# Patient Record
Sex: Female | Born: 1960 | Race: Black or African American | Hispanic: No | Marital: Single | Smoking: Former smoker
Health system: Southern US, Community
[De-identification: ages and names within clinical notes are randomized; demographics above are authoritative.]

## PROBLEM LIST (undated history)

## (undated) DIAGNOSIS — I10 Essential (primary) hypertension: Secondary | ICD-10-CM

## (undated) DIAGNOSIS — K8010 Calculus of gallbladder with chronic cholecystitis without obstruction: Secondary | ICD-10-CM

## (undated) DIAGNOSIS — R569 Unspecified convulsions: Secondary | ICD-10-CM

## (undated) DIAGNOSIS — E079 Disorder of thyroid, unspecified: Secondary | ICD-10-CM

## (undated) DIAGNOSIS — M1990 Unspecified osteoarthritis, unspecified site: Secondary | ICD-10-CM

## (undated) HISTORY — DX: Calculus of gallbladder with chronic cholecystitis without obstruction: K80.10

## (undated) HISTORY — PX: THYROID SURGERY: SHX805

## (undated) HISTORY — DX: Essential (primary) hypertension: I10

---

## 2015-10-13 ENCOUNTER — Encounter (HOSPITAL_COMMUNITY): Payer: Self-pay | Admitting: Emergency Medicine

## 2015-10-13 ENCOUNTER — Emergency Department (HOSPITAL_COMMUNITY)
Admission: EM | Admit: 2015-10-13 | Discharge: 2015-10-13 | Disposition: A | Discharge status: Home / Self Care | Payer: Medicaid Other | Attending: Emergency Medicine | Admitting: Emergency Medicine

## 2015-10-13 DIAGNOSIS — Z79899 Other long term (current) drug therapy: Secondary | ICD-10-CM | POA: Diagnosis not present

## 2015-10-13 DIAGNOSIS — F17210 Nicotine dependence, cigarettes, uncomplicated: Secondary | ICD-10-CM | POA: Insufficient documentation

## 2015-10-13 DIAGNOSIS — R42 Dizziness and giddiness: Secondary | ICD-10-CM | POA: Insufficient documentation

## 2015-10-13 DIAGNOSIS — M1990 Unspecified osteoarthritis, unspecified site: Secondary | ICD-10-CM | POA: Insufficient documentation

## 2015-10-13 HISTORY — DX: Disorder of thyroid, unspecified: E07.9

## 2015-10-13 HISTORY — DX: Unspecified osteoarthritis, unspecified site: M19.90

## 2015-10-13 MED ORDER — PROMETHAZINE HCL 12.5 MG PO TABS
25.0000 mg | ORAL_TABLET | Freq: Once | ORAL | Status: AC
  Administered 2015-10-13: 25 mg via ORAL
  Filled 2015-10-13: qty 2

## 2015-10-13 MED ORDER — MECLIZINE HCL 25 MG PO TABS: 25.0000 mg | ORAL_TABLET | Freq: Three times a day (TID) | ORAL | Status: DC | PRN

## 2015-10-13 MED ORDER — MECLIZINE HCL 12.5 MG PO TABS
25.0000 mg | ORAL_TABLET | Freq: Once | ORAL | Status: AC
  Administered 2015-10-13: 25 mg via ORAL
  Filled 2015-10-13: qty 2

## 2015-10-13 NOTE — ED Notes (Signed)
Pt states her dizziness was better until she ambulated to the bathroom.  MD aware and new orders received.

## 2015-10-13 NOTE — Discharge Instructions (Signed)
Return for worsening symptoms, including confusion, passing out, vision or speech changes, difficulty swallowing, numbness/weakness, inability to walk, or any other symptoms concerning to you.  Vertigo Vertigo means you feel like you or your surroundings are moving when they are not. Vertigo can be dangerous if it occurs when you are at work, driving, or performing difficult activities.  CAUSES  Vertigo occurs when there is a conflict of signals sent to your brain from the visual and sensory systems in your body. There are many different causes of vertigo, including:  Infections, especially in the inner ear.  A bad reaction to a drug or misuse of alcohol and medicines.  Withdrawal from drugs or alcohol.  Rapidly changing positions, such as lying down or rolling over in bed.  A migraine headache.  Decreased blood flow to the brain.  Increased pressure in the brain from a head injury, infection, tumor, or bleeding. SYMPTOMS  You may feel as though the world is spinning around or you are falling to the ground. Because your balance is upset, vertigo can cause nausea and vomiting. You may have involuntary eye movements (nystagmus). DIAGNOSIS  Vertigo is usually diagnosed by physical exam. If the cause of your vertigo is unknown, your caregiver may perform imaging tests, such as an MRI scan (magnetic resonance imaging). TREATMENT  Most cases of vertigo resolve on their own, without treatment. Depending on the cause, your caregiver may prescribe certain medicines. If your vertigo is related to body position issues, your caregiver may recommend movements or procedures to correct the problem. In rare cases, if your vertigo is caused by certain inner ear problems, you may need surgery. HOME CARE INSTRUCTIONS   Follow your caregiver's instructions.  Avoid driving.  Avoid operating heavy machinery.  Avoid performing any tasks that would be dangerous to you or others during a vertigo  episode.  Tell your caregiver if you notice that certain medicines seem to be causing your vertigo. Some of the medicines used to treat vertigo episodes can actually make them worse in some people. SEEK IMMEDIATE MEDICAL CARE IF:   Your medicines do not relieve your vertigo or are making it worse.  You develop problems with talking, walking, weakness, or using your arms, hands, or legs.  You develop severe headaches.  Your nausea or vomiting continues or gets worse.  You develop visual changes.  A family member notices behavioral changes.  Your condition gets worse. MAKE SURE YOU:  Understand these instructions.  Will watch your condition.  Will get help right away if you are not doing well or get worse.   This information is not intended to replace advice given to you by your health care provider. Make sure you discuss any questions you have with your health care provider.   Document Released: 12/29/2004 Document Revised: 06/13/2011 Document Reviewed: 07/14/2014 Elsevier Interactive Patient Education 2016 [employer]  Benign Positional Vertigo Vertigo is the feeling that you or your surroundings are moving when they are not. Benign positional vertigo is the most common form of vertigo. The cause of this condition is not serious (is benign). This condition is triggered by certain movements and positions (is positional). This condition can be dangerous if it occurs while you are doing something that could endanger you or others, such as driving.  CAUSES In many cases, the cause of this condition is not known. It may be caused by a disturbance in an area of the inner ear that helps your brain to sense movement and balance.  This disturbance can be caused by a viral infection (labyrinthitis), head injury, or repetitive motion. RISK FACTORS This condition is more likely to develop in:  Women.  People who are 76 years of age or older. SYMPTOMS Symptoms of this condition usually  happen when you move your head or your eyes in different directions. Symptoms may start suddenly, and they usually last for less than a minute. Symptoms may include:  Loss of balance and falling.  Feeling like you are spinning or moving.  Feeling like your surroundings are spinning or moving.  Nausea and vomiting.  Blurred vision.  Dizziness.  Involuntary eye movement (nystagmus). Symptoms can be mild and cause only slight annoyance, or they can be severe and interfere with daily life. Episodes of benign positional vertigo may return (recur) over time, and they may be triggered by certain movements. Symptoms may improve over time. DIAGNOSIS This condition is usually diagnosed by medical history and a physical exam of the head, neck, and ears. You may be referred to a health care provider who specializes in ear, nose, and throat (ENT) problems (otolaryngologist) or a provider who specializes in disorders of the nervous system (neurologist). You may have additional testing, including:  MRI.  A CT scan.  Eye movement tests. Your health care provider may ask you to change positions quickly while he or she watches you for symptoms of benign positional vertigo, such as nystagmus. Eye movement may be tested with an electronystagmogram (ENG), caloric stimulation, the Dix-Hallpike test, or the roll test.  An electroencephalogram (EEG). This records electrical activity in your brain.  Hearing tests. TREATMENT Usually, your health care provider will treat this by moving your head in specific positions to adjust your inner ear back to normal. Surgery may be needed in severe cases, but this is rare. In some cases, benign positional vertigo may resolve on its own in 2-4 weeks. HOME CARE INSTRUCTIONS Safety  Move slowly.Avoid sudden body or head movements.  Avoid driving.  Avoid operating heavy machinery.  Avoid doing any tasks that would be dangerous to you or others if a vertigo episode  would occur.  If you have trouble walking or keeping your balance, try using a cane for stability. If you feel dizzy or unstable, sit down right away.  Return to your normal activities as told by your health care provider. Ask your health care provider what activities are safe for you. General Instructions  Take over-the-counter and prescription medicines only as told by your health care provider.  Avoid certain positions or movements as told by your health care provider.  Drink enough fluid to keep your urine clear or pale yellow.  Keep all follow-up visits as told by your health care provider. This is important. SEEK MEDICAL CARE IF:  You have a fever.  Your condition gets worse or you develop new symptoms.  Your family or friends notice any behavioral changes.  Your nausea or vomiting gets worse.  You have numbness or a "pins and needles" sensation. SEEK IMMEDIATE MEDICAL CARE IF:  You have difficulty speaking or moving.  You are always dizzy.  You faint.  You develop severe headaches.  You have weakness in your legs or arms.  You have changes in your hearing or vision.  You develop a stiff neck.  You develop sensitivity to light.   This information is not intended to replace advice given to you by your health care provider. Make sure you discuss any questions you have with your health care  provider.   Document Released: 12/27/2005 Document Revised: 12/10/2014 Document Reviewed: 07/14/2014 Elsevier Interactive Patient Education [employer]

## 2015-10-13 NOTE — ED Provider Notes (Signed)
CSN: [id number]     Arrival date & time 10/13/15  1109 History  By signing my name below, I, [facility], attest that this documentation has been prepared under the direction and in the presence of Lavera Guise, MD. Electronically Signed: Randell Patient, ED Scribe. 10/13/2015. 11:48 AM.   Chief Complaint  Patient presents with  . Dizziness    The history is provided by the patient. No language interpreter was used.   HPI Comments: Alejandra Sanders is a 55 y.o. female with a hx of arthritis and thyroid disease who presents to the Emergency Department complaining of constant, gradually improving, moderate dizziness that she describes as room-spinning onset this morning upon waking. She reports associated imbalance when ambulating. Dizziness worse with movement of the head sporadically. She notes similar symptoms 2 days ago that resolved without treatment. NKDA. Denies LOC, slurred speech, difficulty swallowing, double vision, cough, fevers, or any other symptoms currently.  Past Medical History  Diagnosis Date  . Arthritis   . Thyroid disease    Past Surgical History  Procedure Laterality Date  . Thyroid surgery     History reviewed. No pertinent family history. Social History  Substance Use Topics  . Smoking status: Current Every Day Smoker -- 0.50 packs/day    Types: Cigarettes  . Smokeless tobacco: None  . Alcohol Use: No   OB History    No data available     Review of Systems  Constitutional: Negative for fever.  HENT: Negative for trouble swallowing.   Eyes: Negative for visual disturbance.  Respiratory: Negative for cough.   Neurological: Positive for dizziness. Negative for syncope and speech difficulty.  All other systems reviewed and are negative.     Allergies  Review of patient's allergies indicates no known allergies.  Home Medications   Prior to Admission medications   Medication Sig Start Date End Date Taking? Authorizing Provider  meclizine  (ANTIVERT) 25 MG tablet Take 1 tablet (25 mg total) by mouth 3 (three) times daily as needed for dizziness. 10/13/15   Lavera Guise, MD   BP 149/101 mmHg  Pulse 81  Temp(Src) 97.1 F (36.2 C) (Temporal)  Resp 14  Ht 5\' 5"  (1.651 m)  Wt 185 lb (83.915 kg)  BMI 30.79 kg/m2  SpO2 98% Physical Exam Physical Exam  Nursing note and vitals reviewed. Constitutional: Well developed, well nourished, non-toxic, and in no acute distress Head: Normocephalic and atraumatic.  Mouth/Throat: Oropharynx is clear and moist.  Neck: Normal range of motion. Neck supple.  Cardiovascular: Normal rate and regular rhythm.   Pulmonary/Chest: Effort normal and breath sounds normal.  Abdominal: Soft. There is no tenderness. There is no rebound and no guarding.  Musculoskeletal: Normal range of motion.  Neurological: Alert, oriented to person, place, time, and situation. Memory grossly in tact. Fluent speech. No dysarthria or aphasia.  Cranial nerves: VF are full. Pupils are symmetric, and reactive to light. EOMI without nystagmus. No gaze deviation. Facial muscles symmetric with activation. Sensation to light touch over face in tact bilaterally. Hearing grossly in tact. Palate elevates symmetrically. Head turn and shoulder shrug are intact. Tongue midline.  Reflexes defered.  Muscle bulk and tone normal. No pronator drift. Moves all extremities symmetrically. Sensation to light touch is in tact throughout in bilateral upper and lower extremities. Coordination reveals no dysmetria with finger to nose. Gait is narrow-based and steady. Non-ataxic. Skin: Skin is warm and dry.  Psychiatric: Cooperative  ED Course  Procedures   DIAGNOSTIC STUDIES: Oxygen  Saturation is 99% on RA, normal by my interpretation.    COORDINATION OF CARE: 11:43 AM Will provide pt with information about positions to alleviate vertigo. Will order Phenergan. Discussed treatment plan with pt at bedside and pt agreed to plan.   Labs  Review Labs Reviewed - No data to display  Imaging Review No results found. I have personally reviewed and evaluated these images and lab results as part of my medical decision-making.   EKG Interpretation None      MDM   Final diagnoses:  Vertigo    55 year old female with history of hypothyroidism who presents with a sensation of vertigo. She is well-appearing and in no acute distress. She has no concerning features on her neurological exam that would be suggestive of central etiology. She is able to ambulate steadily. Seems consistent with peripheral vertigo and is given Phenergan and meclizine with improvement in her symptoms. Neurological exam remains intact. Given meclizine for home. Strict return and follow-up instructions reviewed. She expressed understanding of all discharge instructions and felt comfortable with the plan of care.   I personally performed the services described in this documentation, which was scribed in my presence. The recorded information has been reviewed and is accurate.   Lavera Guise, MD 10/13/15 [phone]

## 2015-10-13 NOTE — ED Notes (Addendum)
Pt reports dizziness when she woke up this morning. States she woke up at 0700 this morning and did not go to bed dizzy. States similar episode happened on Sunday, but lasted only 5 min. Pt reports increase in dizziness with positional changes. Denies any neurological deficits.

## 2017-06-14 ENCOUNTER — Encounter (HOSPITAL_COMMUNITY): Payer: Self-pay | Admitting: *Deleted

## 2017-06-14 ENCOUNTER — Emergency Department (HOSPITAL_COMMUNITY): Payer: Medicaid Other

## 2017-06-14 ENCOUNTER — Other Ambulatory Visit: Payer: Self-pay

## 2017-06-14 DIAGNOSIS — M79674 Pain in right toe(s): Secondary | ICD-10-CM | POA: Diagnosis present

## 2017-06-14 DIAGNOSIS — B351 Tinea unguium: Secondary | ICD-10-CM | POA: Insufficient documentation

## 2017-06-14 DIAGNOSIS — F17210 Nicotine dependence, cigarettes, uncomplicated: Secondary | ICD-10-CM | POA: Diagnosis not present

## 2017-06-14 DIAGNOSIS — L600 Ingrowing nail: Secondary | ICD-10-CM | POA: Insufficient documentation

## 2017-06-14 NOTE — ED Triage Notes (Signed)
Pt c/o right foot pain that starts at her big toe and radiates to the side of her foot

## 2017-06-15 ENCOUNTER — Emergency Department (HOSPITAL_COMMUNITY)
Admission: EM | Admit: 2017-06-15 | Discharge: 2017-06-15 | Disposition: A | Discharge status: Home / Self Care | Payer: Medicaid Other | Attending: Emergency Medicine | Admitting: Emergency Medicine

## 2017-06-15 DIAGNOSIS — M79674 Pain in right toe(s): Secondary | ICD-10-CM

## 2017-06-15 DIAGNOSIS — B351 Tinea unguium: Secondary | ICD-10-CM

## 2017-06-15 DIAGNOSIS — L600 Ingrowing nail: Secondary | ICD-10-CM

## 2017-06-15 MED ORDER — CLOTRIMAZOLE 1 % EX CREA: TOPICAL_CREAM | CUTANEOUS | 0 refills | Status: DC

## 2017-06-15 MED ORDER — DOXYCYCLINE HYCLATE 100 MG PO CAPS: 100.0000 mg | ORAL_CAPSULE | Freq: Two times a day (BID) | ORAL | 0 refills | Status: DC

## 2017-06-15 NOTE — ED Provider Notes (Signed)
[facility] EMERGENCY DEPARTMENT Provider Note   CSN: [id number] Arrival date & time: 06/14/17  2222     History   Chief Complaint Chief Complaint  Patient presents with  . Foot Pain    HPI Alejandra Sanders is a 57 y.o. female.  Patient with 2 days of right great toe pain that radiates throughout her entire foot when she tries to stand.  Denies any falls or injuries.  Denies any fever or vomiting.  History of arthritis in the past.  No history of gout.  No other joint issues.  She has pain with ambulation and pain along her right great toe nailbed.  There is no been no bleeding or drainage.   The history is provided by the patient.  Foot Pain  Pertinent negatives include no abdominal pain, no headaches and no shortness of breath.    Past Medical History:  Diagnosis Date  . Arthritis   . Thyroid disease     There are no active problems to display for this patient.   Past Surgical History:  Procedure Laterality Date  . THYROID SURGERY      OB History    No data available       Home Medications    Prior to Admission medications   Medication Sig Start Date End Date Taking? Authorizing Provider  meclizine (ANTIVERT) 25 MG tablet Take 1 tablet (25 mg total) by mouth 3 (three) times daily as needed for dizziness. 10/13/15   Lavera Guise, MD    Family History History reviewed. No pertinent family history.  Social History Social History   Tobacco Use  . Smoking status: Current Every Day Smoker    Packs/day: 0.25    Types: Cigarettes  . Smokeless tobacco: Never Used  Substance Use Topics  . Alcohol use: No  . Drug use: No     Allergies   Patient has no known allergies.   Review of Systems Review of Systems  Constitutional: Negative for activity change, appetite change and fever.  HENT: Negative for congestion and rhinorrhea.   Respiratory: Negative for cough, chest tightness and shortness of breath.   Gastrointestinal: Negative for abdominal pain,  nausea and vomiting.  Genitourinary: Negative for dysuria.  Musculoskeletal: Positive for arthralgias and myalgias.  Skin: Positive for wound.  Neurological: Negative for dizziness, weakness and headaches.   all other systems are negative except as noted in the HPI and PMH.     Physical Exam Updated Vital Signs BP (!) 163/91   Pulse 87   Temp 97.8 F (36.6 C) (Oral)   Resp 16   Ht 5\' 5"  (1.651 m)   Wt 84.4 kg (186 lb)   SpO2 95%   BMI 30.95 kg/m   Physical Exam  Constitutional: She is oriented to person, place, and time. She appears well-developed and well-nourished. No distress.  HENT:  Head: Normocephalic and atraumatic.  Mouth/Throat: Oropharynx is clear and moist. No oropharyngeal exudate.  Eyes: Conjunctivae and EOM are normal. Pupils are equal, round, and reactive to light.  Neck: Normal range of motion. Neck supple.  No meningismus.  Cardiovascular: Normal rate, regular rhythm, normal heart sounds and intact distal pulses.  No murmur heard. Pulmonary/Chest: Effort normal and breath sounds normal. No respiratory distress.  Abdominal: Soft. There is no tenderness. There is no rebound and no guarding.  Musculoskeletal: Normal range of motion. She exhibits tenderness. She exhibits no edema.  Onychomycosis present. Ingrown nail present. There is tenderness to palpation across right great toe  cuticle.  There is no erythema or fluctuance. Intact DP and PT pulses.  Intact Achilles. No paronychia. Ingrown toenail present.  Neurological: She is alert and oriented to person, place, and time. No cranial nerve deficit. She exhibits normal muscle tone. Coordination normal.   5/5 strength throughout. CN 2-12 intact.Equal grip strength.   Skin: Skin is warm.  Psychiatric: She has a normal mood and affect. Her behavior is normal.  Nursing note and vitals reviewed.    ED Treatments / Results  Labs (all labs ordered are listed, but only abnormal results are displayed) Labs  Reviewed - No data to display  EKG  EKG Interpretation None       Radiology Dg Foot Complete Right  Result Date: 06/14/2017 CLINICAL DATA:  57 year old female with pain in the right foot and great toe. EXAM: RIGHT FOOT COMPLETE - 3+ VIEW COMPARISON:  None. FINDINGS: There is no evidence of fracture or dislocation. There is no evidence of arthropathy or other focal bone abnormality. Soft tissues are unremarkable. IMPRESSION: Negative. Electronically Signed   By: Elgie Collard M.D.   On: 06/14/2017 23:27    Procedures Procedures (including critical care time)  Medications Ordered in ED Medications - No data to display   Initial Impression / Assessment and Plan / ED Course  I have reviewed the triage vital signs and the nursing notes.  Pertinent labs & imaging results that were available during my care of the patient were reviewed by me and considered in my medical decision making (see chart for details).    Right great toe pain without trauma.  Neurovascularly intact. No systemic symptoms.   We will treat for onychomycosis and possible early infection.  No evidence of paronychia at this time.  X-rays negative. No MTP tenderness.  Doubt gout.  Patient to follow-up with podiatry.  Will give empiric antibiotics as well as topical antifungals.  Return precautions discussed.  Final Clinical Impressions(s) / ED Diagnoses   Final diagnoses:  Onychomycosis  Pain of right great toe  Ingrown nail of great toe of right foot    ED Discharge Orders    None       Avan Gullett, Jeannett Senior, MD 06/15/17 [phone]

## 2017-06-15 NOTE — Discharge Instructions (Signed)
Follow-up with Dr. Nolen Mu regarding her ingrown toenails.  Take the antibiotic as prescribed.  Return to the ED if you develop new or worsening symptoms.

## 2018-05-21 ENCOUNTER — Other Ambulatory Visit (HOSPITAL_COMMUNITY): Payer: Self-pay | Admitting: Family Medicine

## 2018-05-21 ENCOUNTER — Ambulatory Visit (HOSPITAL_COMMUNITY)
Admission: RE | Admit: 2018-05-21 | Discharge: 2018-05-21 | Disposition: A | Discharge status: Home / Self Care | Payer: Medicaid Other | Source: Ambulatory Visit | Attending: Family Medicine | Admitting: Family Medicine

## 2018-05-21 DIAGNOSIS — M069 Rheumatoid arthritis, unspecified: Secondary | ICD-10-CM

## 2018-05-21 DIAGNOSIS — M25532 Pain in left wrist: Secondary | ICD-10-CM | POA: Diagnosis not present

## 2018-05-27 ENCOUNTER — Emergency Department (HOSPITAL_COMMUNITY): Payer: Medicaid Other

## 2018-05-27 ENCOUNTER — Emergency Department (HOSPITAL_COMMUNITY)
Admission: EM | Admit: 2018-05-27 | Discharge: 2018-05-27 | Disposition: A | Discharge status: Home / Self Care | Payer: Medicaid Other | Attending: Emergency Medicine | Admitting: Emergency Medicine

## 2018-05-27 ENCOUNTER — Encounter (HOSPITAL_COMMUNITY): Payer: Self-pay | Admitting: Emergency Medicine

## 2018-05-27 DIAGNOSIS — F17210 Nicotine dependence, cigarettes, uncomplicated: Secondary | ICD-10-CM | POA: Insufficient documentation

## 2018-05-27 DIAGNOSIS — Z79899 Other long term (current) drug therapy: Secondary | ICD-10-CM | POA: Diagnosis not present

## 2018-05-27 DIAGNOSIS — K8000 Calculus of gallbladder with acute cholecystitis without obstruction: Secondary | ICD-10-CM | POA: Diagnosis not present

## 2018-05-27 DIAGNOSIS — R1013 Epigastric pain: Secondary | ICD-10-CM | POA: Diagnosis present

## 2018-05-27 DIAGNOSIS — N2889 Other specified disorders of kidney and ureter: Secondary | ICD-10-CM

## 2018-05-27 LAB — COMPREHENSIVE METABOLIC PANEL
ALBUMIN: 3.9 g/dL (ref 3.5–5.0)
ALT: 19 U/L (ref 0–44)
ANION GAP: 9 (ref 5–15)
AST: 15 U/L (ref 15–41)
Alkaline Phosphatase: 113 U/L (ref 38–126)
BILIRUBIN TOTAL: 0.2 mg/dL — AB (ref 0.3–1.2)
BUN: 11 mg/dL (ref 6–20)
CALCIUM: 9.3 mg/dL (ref 8.9–10.3)
CO2: 27 mmol/L (ref 22–32)
Chloride: 100 mmol/L (ref 98–111)
Creatinine, Ser: 0.61 mg/dL (ref 0.44–1.00)
GFR calc Af Amer: >60 mL/min (ref >60)
GLUCOSE: 105 mg/dL — AB (ref 70–99)
Potassium: 3.5 mmol/L (ref 3.5–5.1)
Sodium: 136 mmol/L (ref 135–145)
TOTAL PROTEIN: 7.9 g/dL (ref 6.5–8.1)

## 2018-05-27 LAB — CBC
HEMATOCRIT: 42.3 % (ref 36.0–46.0)
Hemoglobin: 13 g/dL (ref 12.0–15.0)
MCH: 25 pg — AB (ref 26.0–34.0)
MCHC: 30.7 g/dL (ref 30.0–36.0)
MCV: 81.5 fL (ref 80.0–100.0)
NRBC: 0 % (ref 0.0–0.2)
Platelets: 276 10*3/uL (ref 150–400)
RBC: 5.19 MIL/uL — ABNORMAL HIGH (ref 3.87–5.11)
RDW: 14.6 % (ref 11.5–15.5)
WBC: 7.2 10*3/uL (ref 4.0–10.5)

## 2018-05-27 LAB — TROPONIN I: Troponin I: <0.03 ng/mL (ref <0.03)

## 2018-05-27 LAB — LIPASE, BLOOD: Lipase: 30 U/L (ref 11–51)

## 2018-05-27 MED ORDER — OXYCODONE-ACETAMINOPHEN 5-325 MG PO TABS: 1.0000 | ORAL_TABLET | ORAL | 0 refills | Status: DC | PRN

## 2018-05-27 MED ORDER — ONDANSETRON HCL 4 MG PO TABS: 4.0000 mg | ORAL_TABLET | Freq: Four times a day (QID) | ORAL | 0 refills | Status: DC

## 2018-05-27 MED ORDER — SODIUM CHLORIDE 0.9 % IV BOLUS
500.0000 mL | Freq: Once | INTRAVENOUS | Status: AC
  Administered 2018-05-27: 500 mL via INTRAVENOUS

## 2018-05-27 MED ORDER — SODIUM CHLORIDE 0.9% FLUSH: 3.0000 mL | Freq: Once | INTRAVENOUS | Status: DC

## 2018-05-27 MED ORDER — ONDANSETRON HCL 4 MG/2ML IJ SOLN
4.0000 mg | Freq: Once | INTRAMUSCULAR | Status: AC
  Administered 2018-05-27: 4 mg via INTRAVENOUS
  Filled 2018-05-27: qty 2

## 2018-05-27 MED ORDER — IOHEXOL 300 MG/ML  SOLN
100.0000 mL | Freq: Once | INTRAMUSCULAR | Status: AC | PRN
  Administered 2018-05-27: 100 mL via INTRAVENOUS

## 2018-05-27 NOTE — ED Notes (Signed)
Patient transported to CT 

## 2018-05-27 NOTE — Discharge Instructions (Signed)
You have gallstones that are inflamed.  You will need to see a general surgeon.  Phone number given.  Medication for pain and nausea.  Avoid greasy foods.  Additionally, you have a small mass in your right kidney.  You will need an ultrasound or MRI of this area.  Your primary care doctor can arrange this.

## 2018-05-27 NOTE — ED Triage Notes (Signed)
Patient complains of chest pain that radiates towards middle of back x 3 days. Denies n/v/d.

## 2018-05-27 NOTE — ED Provider Notes (Addendum)
[facility] EMERGENCY DEPARTMENT Provider Note   CSN: [id number] Arrival date & time: 05/27/18  1515    History   Chief Complaint Chief Complaint  Patient presents with  . Chest Pain    HPI Alejandra Sanders is a 58 y.o. female.     Epiastric pain radiating to right upper quadrant and right mid back for several days, worse after eating meals.  No known history of gallbladder disease.  Severity of pain is moderate.  Patient has taken nothing at home.     Past Medical History:  Diagnosis Date  . Arthritis   . Thyroid disease     There are no active problems to display for this patient.   Past Surgical History:  Procedure Laterality Date  . THYROID SURGERY       OB History   No obstetric history on file.      Home Medications    Prior to Admission medications   Medication Sig Start Date End Date Taking? Authorizing Provider  amLODipine (NORVASC) 10 MG tablet Take 10 mg by mouth daily. 05/11/18  Yes [provider]  folic acid (FOLVITE) 1 MG tablet Take 1 mg by mouth daily. 04/17/18  Yes [provider]  hydroxychloroquine (PLAQUENIL) 200 MG tablet Take 2 tablets by mouth daily. 04/17/18  Yes [provider]  lisinopril (PRINIVIL,ZESTRIL) 10 MG tablet Take 10 mg by mouth daily. 03/11/18  Yes [provider]  meclizine (ANTIVERT) 25 MG tablet Take 1 tablet (25 mg total) by mouth 3 (three) times daily as needed for dizziness. 10/13/15  Yes Lavera Guise, MD  meloxicam (MOBIC) 15 MG tablet Take 15 mg by mouth daily.   Yes [provider]  ORENCIA CLICKJECT 125 MG/ML SOAJ Inject 1 pen into the skin once a week. Takes on Tuesdays 02/28/18  Yes [provider]  RASUVO 20 MG/0.4ML SOAJ Inject 1 pen into the skin once a week. Takes on Tuesdays 04/27/18  Yes [provider]  clotrimazole (LOTRIMIN) 1 % cream Apply to affected area 2 times daily 06/15/17   Rancour, Jeannett Senior, MD  doxycycline (VIBRAMYCIN) 100 MG capsule Take  1 capsule (100 mg total) by mouth 2 (two) times daily. 06/15/17   Rancour, Jeannett Senior, MD  ondansetron (ZOFRAN) 4 MG tablet Take 1 tablet (4 mg total) by mouth every 6 (six) hours. 05/27/18   Donnetta Hutching, MD  oxyCODONE-acetaminophen (PERCOCET) 5-325 MG tablet Take 1 tablet by mouth every 4 (four) hours as needed. 05/27/18   Donnetta Hutching, MD  oxyCODONE-acetaminophen (PERCOCET) 5-325 MG tablet Take 1 tablet by mouth every 4 (four) hours as needed. 05/27/18   Donnetta Hutching, MD    Family History No family history on file.  Social History Social History   Tobacco Use  . Smoking status: Current Every Day Smoker    Packs/day: 0.25    Types: Cigarettes  . Smokeless tobacco: Never Used  Substance Use Topics  . Alcohol use: No  . Drug use: No     Allergies   Patient has no known allergies.   Review of Systems Review of Systems  All other systems reviewed and are negative.    Physical Exam Updated Vital Signs BP 102/63   Pulse 88   Resp (!) 23   Ht 5\' 5"  (1.651 m)   Wt 88 kg   SpO2 97%   BMI 32.28 kg/m   Physical Exam Vitals signs and nursing note reviewed.  Constitutional:      Appearance: She is well-developed.  HENT:     Head: Normocephalic and atraumatic.  Eyes:     Conjunctiva/sclera: Conjunctivae normal.  Neck:     Musculoskeletal: Neck supple.  Cardiovascular:     Rate and Rhythm: Normal rate and regular rhythm.  Pulmonary:     Effort: Pulmonary effort is normal.     Breath sounds: Normal breath sounds.  Abdominal:     Comments: Minimal tenderness epigastrium and right upper quadrant  Musculoskeletal: Normal range of motion.  Skin:    General: Skin is warm and dry.  Neurological:     Mental Status: She is alert and oriented to person, place, and time.  Psychiatric:        Behavior: Behavior normal.      ED Treatments / Results  Labs (all labs ordered are listed, but only abnormal results are displayed) Labs Reviewed  CBC - Abnormal; Notable for the  following components:      Result Value   RBC 5.19 (*)    MCH 25.0 (*)    All other components within normal limits  COMPREHENSIVE METABOLIC PANEL - Abnormal; Notable for the following components:   Glucose, Bld 105 (*)    Total Bilirubin 0.2 (*)    All other components within normal limits  TROPONIN I  LIPASE, BLOOD    EKG EKG Interpretation  Date/Time:  Sunday May 27 2018 15:27:07 EST Ventricular Rate:  93 PR Interval:  140 QRS Duration: 86 QT Interval:  374 QTC Calculation: 465 R Axis:   92 Text Interpretation:  Normal sinus rhythm Biatrial enlargement Rightward axis Pulmonary disease pattern Abnormal ECG Confirmed by Donnetta Hutchingook, Richardson Dubree ([id number]) on 05/27/2018 5:37:17 PM   Radiology Dg Chest 2 View  Result Date: 05/27/2018 CLINICAL DATA:  Chest pain EXAM: CHEST - 2 VIEW COMPARISON:  None. FINDINGS: Heart and mediastinal contours are within normal limits. No focal opacities or effusions. No acute bony abnormality. IMPRESSION: No active cardiopulmonary disease. Electronically Signed   By: Charlett NoseKevin  Dover M.D.   On: 05/27/2018 16:18   Ct Abdomen Pelvis W Contrast  Result Date: 05/27/2018 CLINICAL DATA:  Chest pain radiating towards the middle of the back for 3 days. EXAM: CT ABDOMEN AND PELVIS WITH CONTRAST TECHNIQUE: Multidetector CT imaging of the abdomen and pelvis was performed using the standard protocol following bolus administration of intravenous contrast. CONTRAST:  100mL OMNIPAQUE IOHEXOL 300 MG/ML  SOLN COMPARISON:  None. FINDINGS: Lower chest: Lung bases are clear. Hepatobiliary: Small circumscribed cyst in the lateral segment left lobe of liver. No other focal lesions. Mild diffuse fatty infiltration of the liver. The gallbladder is mildly contracted and thick walled with multiple stones including a 1.6 cm stone in the gallbladder neck. There is pericholecystic edema and stranding. Changes are consistent with acute cholecystitis. No bile duct dilatation. Pancreas: Unremarkable.  No pancreatic ductal dilatation or surrounding inflammatory changes. Spleen: Normal in size without focal abnormality. Adrenals/Urinary Tract: No adrenal gland nodules. There is a 1.3 cm diameter lesion in the posterior midpole of the right kidney. The lesion demonstrates increased Hounsfield units on both early and delayed phase images and may represent a solid lesion. Hemorrhagic cyst would be another possibility. Suggest follow-up with either ultrasound to look for cystic nature of the lesion or renal protocol MRI for further characterization. There are multiple tiny low-attenuation lesions in both kidneys probably representing small cysts but too small to characterize. No hydronephrosis or hydroureter. Nephrograms are symmetrical. Bladder wall is not thickened. Stomach/Bowel: Stomach is within normal limits. Appendix appears normal.  No evidence of bowel wall thickening, distention, or inflammatory changes. Vascular/Lymphatic: Aortic atherosclerosis. No enlarged abdominal or pelvic lymph nodes. Reproductive: Uterus and bilateral adnexa are unremarkable. Other: No abdominal wall hernia or abnormality. No abdominopelvic ascites. Musculoskeletal: Mild degenerative changes in the spine. No destructive bone lesions. IMPRESSION: 1. Cholelithiasis with inflammatory changes in the gallbladder consistent with acute cholecystitis. 2. 1.3 cm diameter indeterminate lesion in the right kidney. Suggest follow-up with either ultrasound to assess for cystic nature of the lesion or renal protocol MRI for further characterization. 3. Mild diffuse fatty infiltration of the liver. Aortic Atherosclerosis (ICD10-I70.0). Electronically Signed   By: Burman Nieves M.D.   On: 05/27/2018 19:35    Procedures Procedures (including critical care time)  Medications Ordered in ED Medications  sodium chloride flush (NS) 0.9 % injection 3 mL (has no administration in time range)  ondansetron (ZOFRAN) injection 4 mg (4 mg Intravenous  Given 05/27/18 1803)  sodium chloride 0.9 % bolus 500 mL (500 mLs Intravenous New Bag/Given 05/27/18 1802)  iohexol (OMNIPAQUE) 300 MG/ML solution 100 mL (100 mLs Intravenous Contrast Given 05/27/18 1855)     Initial Impression / Assessment and Plan / ED Course  I have reviewed the triage vital signs and the nursing notes.  Pertinent labs & imaging results that were available during my care of the patient were reviewed by me and considered in my medical decision making (see chart for details).        Epigastric pain with radiation right upper quadrant and right mid back for 3 days.  White count and liver function normal.  CT abdomen pelvis reveals cholelithiasis with possible cholecystitis.  Will discuss with general surgery.  I also discussed the lesion on the right kidney.  She will follow-up with her primary care doctor.  Final Clinical Impressions(s) / ED Diagnoses   Final diagnoses:  Calculus of gallbladder with acute cholecystitis without obstruction  Mass of right kidney    ED Discharge Orders         Ordered    oxyCODONE-acetaminophen (PERCOCET) 5-325 MG tablet  Every 4 hours PRN     05/27/18 2024    ondansetron (ZOFRAN) 4 MG tablet  Every 6 hours     05/27/18 2024    oxyCODONE-acetaminophen (PERCOCET) 5-325 MG tablet  Every 4 hours PRN     05/27/18 2024           Donnetta Hutching, MD 05/27/18 2007    Donnetta Hutching, MD 05/27/18 2026

## 2018-05-28 MED FILL — Oxycodone w/ Acetaminophen Tab 5-325 MG: ORAL | Qty: 6 | Status: AC

## 2018-06-05 ENCOUNTER — Ambulatory Visit (INDEPENDENT_AMBULATORY_CARE_PROVIDER_SITE_OTHER): Payer: Medicaid Other | Admitting: General Surgery

## 2018-06-05 ENCOUNTER — Encounter: Payer: Self-pay | Admitting: General Surgery

## 2018-06-05 VITALS — BP 122/69 | HR 96 | Temp 96.8°F | Resp 18 | Wt 187.2 lb

## 2018-06-05 DIAGNOSIS — K8020 Calculus of gallbladder without cholecystitis without obstruction: Secondary | ICD-10-CM

## 2018-06-05 NOTE — H&P (Signed)
[facility] History and Physical  Reason for Referral:Gallstones  Referring Physician:  Dr. Janna Arch, MD (PCP)      Chief Complaint    Cholelithiasis      Alejandra Sanders is a 58 y.o. female.  HPI: Alejandra Sanders is a very sweet 58 yo who was recently seen in the ED with complaints of epigastric and RUQ pain that started after eating some chicken wings. She says that the pain was at first like heartburn but did not improve with any medicines, and was worse after each meal. She went to the ED as she was afraid the pain was more in her chest, and she was worked up.  She had a normal troponin and EKG, and was sent for a CT that demonstrated stones and possible gallbladder wall thickening.   She said in the ED her pain improved, and the ED discussed the case with Dr. Lovell Sheehan who said for her follow up as an outpatient.  She reported no associated nausea or vomiting or bloating. She has been on a low fat diet and only eating crackers, and does not feel any better. She is here today to discuss gallbladder removal.        Past Medical History:  Diagnosis Date  . Arthritis   . Hypertension   . Thyroid disease          Past Surgical History:  Procedure Laterality Date  . THYROID SURGERY           Family History  Problem Relation Age of Onset  . Hypertension Mother   . Diabetes Mother   . Hypertension Father     Social History        Tobacco Use  . Smoking status: Current Every Day Smoker    Packs/day: 0.25    Types: Cigarettes  . Smokeless tobacco: Never Used  Substance Use Topics  . Alcohol use: No  . Drug use: No    Medications: I have reviewed the patient's current medications. Allergies as of 06/05/2018   No Known Allergies        Medication List       Accurate as of June 05, 2018 10:00 AM. Always use your most recent med list.        amLODipine 10 MG tablet Commonly known as:  NORVASC Take 10 mg by mouth  daily.   folic acid 1 MG tablet Commonly known as:  FOLVITE Take 1 mg by mouth daily.   hydroxychloroquine 200 MG tablet Commonly known as:  PLAQUENIL Take 2 tablets by mouth daily.   lisinopril 10 MG tablet Commonly known as:  PRINIVIL,ZESTRIL Take 10 mg by mouth daily.   meloxicam 15 MG tablet Commonly known as:  MOBIC Take 15 mg by mouth daily.   ORENCIA CLICKJECT 125 MG/ML Soaj Generic drug:  Abatacept Inject 1 pen into the skin once a week. Takes on Tuesdays   oxyCODONE-acetaminophen 5-325 MG tablet Commonly known as:  PERCOCET Take 1 tablet by mouth every 4 (four) hours as needed.   oxyCODONE-acetaminophen 5-325 MG tablet Commonly known as:  PERCOCET Take 1 tablet by mouth every 4 (four) hours as needed.   RASUVO 20 MG/0.4ML Soaj Generic drug:  Methotrexate (PF) Inject 1 pen into the skin once a week. Takes on Tuesdays        ROS:  A comprehensive review of systems was negative except for: Gastrointestinal: positive for abdominal pain Musculoskeletal: positive for joint pain  Blood pressure 122/69, pulse 96, temperature (!)  96.8 F (36 C), temperature source Temporal, resp. rate 18, weight 187 lb 3.2 oz (84.9 kg). Physical Exam Vitals signs reviewed.  Constitutional:      Appearance: Normal appearance.  HENT:     Head: Normocephalic and atraumatic.     Mouth/Throat:     Mouth: Mucous membranes are moist.  Eyes:     Extraocular Movements: Extraocular movements intact.     Pupils: Pupils are equal, round, and reactive to light.  Neck:     Musculoskeletal: Normal range of motion.  Cardiovascular:     Rate and Rhythm: Normal rate and regular rhythm.  Pulmonary:     Effort: Pulmonary effort is normal.     Breath sounds: Normal breath sounds.  Abdominal:     General: There is no distension.     Palpations: Abdomen is soft.     Tenderness: There is no abdominal tenderness.     Hernia: No hernia is present.  Musculoskeletal: Normal range  of motion.        General: No swelling.  Skin:    General: Skin is warm and dry.  Neurological:     General: No focal deficit present.     Mental Status: She is alert and oriented to person, place, and time.  Psychiatric:        Mood and Affect: Mood normal.        Behavior: Behavior normal.        Thought Content: Thought content normal.        Judgment: Judgment normal.     Results: personally reviewed CT- gallbladder with calcified stones, some thickening of the wall and fluid around it, likely more consistent with chronic inflammatory changes  CT a/p 05/27/2018 IMPRESSION: 1. Cholelithiasis with inflammatory changes in the gallbladder consistent with acute cholecystitis. 2. 1.3 cm diameter indeterminate lesion in the right kidney. Suggest follow-up with either ultrasound to assess for cystic nature of the lesion or renal protocol MRI for further characterization. 3. Mild diffuse fatty infiltration of the liver.  Aortic Atherosclerosis (ICD10-I70.0).  Reviewed labs from ED- no leukocytosis or LFT changes  05/27/2018 Labs Results for Alejandra Sanders, Alejandra Sanders (MRN [id number]) as of 06/05/2018 09:29  Ref. Range 05/27/2018 15:28  COMPREHENSIVE METABOLIC PANEL Unknown Rpt (A)  Sodium Latest Ref Range: 135 - 145 mmol/L 136  Potassium Latest Ref Range: 3.5 - 5.1 mmol/L 3.5  Chloride Latest Ref Range: 98 - 111 mmol/L 100  CO2 Latest Ref Range: 22 - 32 mmol/L 27  Glucose Latest Ref Range: 70 - 99 mg/dL 248 (H)  BUN Latest Ref Range: 6 - 20 mg/dL 11  Creatinine Latest Ref Range: 0.44 - 1.00 mg/dL 2.50  Calcium Latest Ref Range: 8.9 - 10.3 mg/dL 9.3  Anion gap Latest Ref Range: 5 - 15  9  Alkaline Phosphatase Latest Ref Range: 38 - 126 U/L 113  Albumin Latest Ref Range: 3.5 - 5.0 g/dL 3.9  Lipase Latest Ref Range: 11 - 51 U/L 30  AST Latest Ref Range: 15 - 41 U/L 15  ALT Latest Ref Range: 0 - 44 U/L 19  Total Protein Latest Ref Range: 6.5 - 8.1 g/dL 7.9  Total Bilirubin Latest Ref  Range: 0.3 - 1.2 mg/dL 0.2 (L)  GFR, Est Non African American Latest Ref Range: >60 mL/min >60  GFR, Est African American Latest Ref Range: >60 mL/min >60  Troponin I Latest Ref Range: <0.03 ng/mL <0.03  WBC Latest Ref Range: 4.0 - 10.5 K/uL 7.2  RBC Latest Ref Range:  3.87 - 5.11 MIL/uL 5.19 (H)  Hemoglobin Latest Ref Range: 12.0 - 15.0 g/dL 16.113.0  HCT Latest Ref Range: 36.0 - 46.0 % 42.3  MCV Latest Ref Range: 80.0 - 100.0 fL 81.5  MCH Latest Ref Range: 26.0 - 34.0 pg 25.0 (L)  MCHC Latest Ref Range: 30.0 - 36.0 g/dL 09.630.7  RDW Latest Ref Range: 11.5 - 15.5 % 14.6  Platelets Latest Ref Range: 150 - 400 K/uL 276  nRBC Latest Ref Range: 0.0 - 0.2 % 0.0   Assessment & Plan:  Samule DryGloria Clipper is a 58 y.o. female with symptomatic cholelithiasis and possibly some degree of chronic cholecystitis given her CT scan and continued symptoms. She is here today to discuss gallbladder removal. She is overall doing fair without pain but has not been able to eat well since her visit to th ED.  With meals she has some RUQ pain but not as severe as the first episode.   -OR for laparosocpic cholecystectomy -She takes Orencia, Abatacept weekly and Resuva, Methotrexate weekly. She missed her dose of Orencia last week per her report.  On reviewing timing for surgery, the Orencia should be held and surgery should be scheduled in week 2, the methotrexate should be continued. She will not take today's dose of Orcenia, and this will mean she will have been off it for 2 weeks if we do the surgery next Monday.   -OR 06/11/2018, Will plan to hold the Orencia 1 more week after surgery but continue the methotexate as scheduled    -CT from the ED with concern for Right kidney lesion- will defer to PCP, Dr. Janna Archondiego for scheduling follow up imaging with US/ MRI for this lesion, will send him an Epic message regarding this   PLAN: I counseled the patient about the indication, risks and benefits of laparoscopic cholecystectomy.   She understands there is a very small chance for bleeding, infection, injury to normal structures (including common bile duct), conversion to open surgery, persistent symptoms, evolution of postcholecystectomy diarrhea, need for secondary interventions, anesthesia reaction, cardiopulmonary issues and other risks not specifically detailed here. I described the expected recovery, the plan for follow-up and the restrictions during the recovery phase.  We also discussed that her biologics make her at an increased risk for not healing and infections, and this is why we are holding the [place].  All questions were answered.  All questions were answered to the satisfaction of the patient and family.   Lucretia RoersLindsay C Kamarie Palma 06/05/2018, 10:00 AM

## 2018-06-05 NOTE — Progress Notes (Signed)
[facility] History and Physical  Reason for Referral:Gallstones  Referring Physician:  Dr. Dondiego, MD (PCP)      Chief Complaint    Cholelithiasis      Alejandra Sanders is a 57 y.o. female.  HPI: Alejandra Sanders is a very sweet 57 yo who was recently seen in the ED with complaints of epigastric and RUQ pain that started after eating some chicken wings. She says that the pain was at first like heartburn but did not improve with any medicines, and was worse after each meal. She went to the ED as she was afraid the pain was more in her chest, and she was worked up.  She had a normal troponin and EKG, and was sent for a CT that demonstrated stones and possible gallbladder wall thickening.   She said in the ED her pain improved, and the ED discussed the case with Dr. Jenkins who said for her follow up as an outpatient.  She reported no associated nausea or vomiting or bloating. She has been on a low fat diet and only eating crackers, and does not feel any better. She is here today to discuss gallbladder removal.        Past Medical History:  Diagnosis Date  . Arthritis   . Hypertension   . Thyroid disease          Past Surgical History:  Procedure Laterality Date  . THYROID SURGERY           Family History  Problem Relation Age of Onset  . Hypertension Mother   . Diabetes Mother   . Hypertension Father     Social History        Tobacco Use  . Smoking status: Current Every Day Smoker    Packs/day: 0.25    Types: Cigarettes  . Smokeless tobacco: Never Used  Substance Use Topics  . Alcohol use: No  . Drug use: No    Medications: I have reviewed the patient's current medications. Allergies as of 06/05/2018   No Known Allergies        Medication List       Accurate as of June 05, 2018 10:00 AM. Always use your most recent med list.        amLODipine 10 MG tablet Commonly known as:  NORVASC Take 10 mg by mouth  daily.   folic acid 1 MG tablet Commonly known as:  FOLVITE Take 1 mg by mouth daily.   hydroxychloroquine 200 MG tablet Commonly known as:  PLAQUENIL Take 2 tablets by mouth daily.   lisinopril 10 MG tablet Commonly known as:  PRINIVIL,ZESTRIL Take 10 mg by mouth daily.   meloxicam 15 MG tablet Commonly known as:  MOBIC Take 15 mg by mouth daily.   ORENCIA CLICKJECT 125 MG/ML Soaj Generic drug:  Abatacept Inject 1 pen into the skin once a week. Takes on Tuesdays   oxyCODONE-acetaminophen 5-325 MG tablet Commonly known as:  PERCOCET Take 1 tablet by mouth every 4 (four) hours as needed.   oxyCODONE-acetaminophen 5-325 MG tablet Commonly known as:  PERCOCET Take 1 tablet by mouth every 4 (four) hours as needed.   RASUVO 20 MG/0.4ML Soaj Generic drug:  Methotrexate (PF) Inject 1 pen into the skin once a week. Takes on Tuesdays        ROS:  A comprehensive review of systems was negative except for: Gastrointestinal: positive for abdominal pain Musculoskeletal: positive for joint pain  Blood pressure 122/69, pulse 96, temperature (!)   96.8 F (36 C), temperature source Temporal, resp. rate 18, weight 187 lb 3.2 oz (84.9 kg). Physical Exam Vitals signs reviewed.  Constitutional:      Appearance: Normal appearance.  HENT:     Head: Normocephalic and atraumatic.     Mouth/Throat:     Mouth: Mucous membranes are moist.  Eyes:     Extraocular Movements: Extraocular movements intact.     Pupils: Pupils are equal, round, and reactive to light.  Neck:     Musculoskeletal: Normal range of motion.  Cardiovascular:     Rate and Rhythm: Normal rate and regular rhythm.  Pulmonary:     Effort: Pulmonary effort is normal.     Breath sounds: Normal breath sounds.  Abdominal:     General: There is no distension.     Palpations: Abdomen is soft.     Tenderness: There is no abdominal tenderness.     Hernia: No hernia is present.  Musculoskeletal: Normal range  of motion.        General: No swelling.  Skin:    General: Skin is warm and dry.  Neurological:     General: No focal deficit present.     Mental Status: She is alert and oriented to person, place, and time.  Psychiatric:        Mood and Affect: Mood normal.        Behavior: Behavior normal.        Thought Content: Thought content normal.        Judgment: Judgment normal.     Results: personally reviewed CT- gallbladder with calcified stones, some thickening of the wall and fluid around it, likely more consistent with chronic inflammatory changes  CT a/p 05/27/2018 IMPRESSION: 1. Cholelithiasis with inflammatory changes in the gallbladder consistent with acute cholecystitis. 2. 1.3 cm diameter indeterminate lesion in the right kidney. Suggest follow-up with either ultrasound to assess for cystic nature of the lesion or renal protocol MRI for further characterization. 3. Mild diffuse fatty infiltration of the liver.  Aortic Atherosclerosis (ICD10-I70.0).  Reviewed labs from ED- no leukocytosis or LFT changes  05/27/2018 Labs Results for Alejandra Sanders (MRN [id number]) as of 06/05/2018 09:29  Ref. Range 05/27/2018 15:28  COMPREHENSIVE METABOLIC PANEL Unknown Rpt (A)  Sodium Latest Ref Range: 135 - 145 mmol/L 136  Potassium Latest Ref Range: 3.5 - 5.1 mmol/L 3.5  Chloride Latest Ref Range: 98 - 111 mmol/L 100  CO2 Latest Ref Range: 22 - 32 mmol/L 27  Glucose Latest Ref Range: 70 - 99 mg/dL 105 (H)  BUN Latest Ref Range: 6 - 20 mg/dL 11  Creatinine Latest Ref Range: 0.44 - 1.00 mg/dL 0.61  Calcium Latest Ref Range: 8.9 - 10.3 mg/dL 9.3  Anion gap Latest Ref Range: 5 - 15  9  Alkaline Phosphatase Latest Ref Range: 38 - 126 U/L 113  Albumin Latest Ref Range: 3.5 - 5.0 g/dL 3.9  Lipase Latest Ref Range: 11 - 51 U/L 30  AST Latest Ref Range: 15 - 41 U/L 15  ALT Latest Ref Range: 0 - 44 U/L 19  Total Protein Latest Ref Range: 6.5 - 8.1 g/dL 7.9  Total Bilirubin Latest Ref  Range: 0.3 - 1.2 mg/dL 0.2 (L)  GFR, Est Non African American Latest Ref Range: >60 mL/min >60  GFR, Est African American Latest Ref Range: >60 mL/min >60  Troponin I Latest Ref Range: <0.03 ng/mL <0.03  WBC Latest Ref Range: 4.0 - 10.5 K/uL 7.2  RBC Latest Ref Range:   3.87 - 5.11 MIL/uL 5.19 (H)  Hemoglobin Latest Ref Range: 12.0 - 15.0 g/dL 13.0  HCT Latest Ref Range: 36.0 - 46.0 % 42.3  MCV Latest Ref Range: 80.0 - 100.0 fL 81.5  MCH Latest Ref Range: 26.0 - 34.0 pg 25.0 (L)  MCHC Latest Ref Range: 30.0 - 36.0 g/dL 30.7  RDW Latest Ref Range: 11.5 - 15.5 % 14.6  Platelets Latest Ref Range: 150 - 400 K/uL 276  nRBC Latest Ref Range: 0.0 - 0.2 % 0.0   Assessment & Plan:  Cella Cannady is a 57 y.o. female with symptomatic cholelithiasis and possibly some degree of chronic cholecystitis given her CT scan and continued symptoms. She is here today to discuss gallbladder removal. She is overall doing fair without pain but has not been able to eat well since her visit to th ED.  With meals she has some RUQ pain but not as severe as the first episode.   -OR for laparosocpic cholecystectomy -She takes Orencia, Abatacept weekly and Resuva, Methotrexate weekly. She missed her dose of Orencia last week per her report.  On reviewing timing for surgery, the Orencia should be held and surgery should be scheduled in week 2, the methotrexate should be continued. She will not take today's dose of Orcenia, and this will mean she will have been off it for 2 weeks if we do the surgery next Monday.   -OR 06/11/2018, Will plan to hold the Orencia 1 more week after surgery but continue the methotexate as scheduled    -CT from the ED with concern for Right kidney lesion- will defer to PCP, Dr. Dondiego for scheduling follow up imaging with US/ MRI for this lesion, will send him an Epic message regarding this   PLAN: I counseled the patient about the indication, risks and benefits of laparoscopic cholecystectomy.   She understands there is a very small chance for bleeding, infection, injury to normal structures (including common bile duct), conversion to open surgery, persistent symptoms, evolution of postcholecystectomy diarrhea, need for secondary interventions, anesthesia reaction, cardiopulmonary issues and other risks not specifically detailed here. I described the expected recovery, the plan for follow-up and the restrictions during the recovery phase.  We also discussed that her biologics make her at an increased risk for not healing and infections, and this is why we are holding the Orcenia.  All questions were answered.  All questions were answered to the satisfaction of the patient and family.    C  06/05/2018, 10:00 AM     

## 2018-06-05 NOTE — Patient Instructions (Signed)
Laparoscopic Cholecystectomy Laparoscopic cholecystectomy is surgery to remove the gallbladder. The gallbladder is a pear-shaped organ that lies beneath the liver on the right side of the body. The gallbladder stores bile, which is a fluid that helps the body to digest fats. Cholecystectomy is often done for inflammation of the gallbladder (cholecystitis). This condition is usually caused by a buildup of gallstones (cholelithiasis) in the gallbladder. Gallstones can block the flow of bile, which can result in inflammation and pain. In severe cases, emergency surgery may be required. This procedure is done though small incisions in your abdomen (laparoscopic surgery). A thin scope with a camera (laparoscope) is inserted through one incision. Thin surgical instruments are inserted through the other incisions. In some cases, a laparoscopic procedure may be turned into a type of surgery that is done through a larger incision (open surgery). Tell a health care provider about:  Any allergies you have.  All medicines you are taking, including vitamins, herbs, eye drops, creams, and over-the-counter medicines.  Any problems you or family members have had with anesthetic medicines.  Any blood disorders you have.  Any surgeries you have had.  Any medical conditions you have.  Whether you are pregnant or may be pregnant. What are the risks? Generally, this is a safe procedure. However, problems may occur, including:  Infection.  Bleeding.  Allergic reactions to medicines.  Damage to other structures or organs.  A stone remaining in the common bile duct. The common bile duct carries bile from the gallbladder into the small intestine.  A bile leak from the cyst duct that is clipped when your gallbladder is removed.   Medicines  Ask your health care provider about: ? Changing or stopping your regular medicines. This is especially important if you are taking diabetes medicines or blood  thinners. ? Taking medicines such as aspirin and ibuprofen. These medicines can thin your blood. Do not take these medicines before your procedure if your health care provider instructs you not to.  You may be given antibiotic medicine to help prevent infection. General instructions  Let your health care provider know if you develop a cold or an infection before surgery.  Plan to have someone take you home from the hospital or clinic.  Ask your health care provider how your surgical site will be marked or identified. What happens during the procedure?   To reduce your risk of infection: ? Your health care team will wash or sanitize their hands. ? Your skin will be washed with soap. ? Hair may be removed from the surgical area.  An IV tube may be inserted into one of your veins.  You will be given one or more of the following: ? A medicine to help you relax (sedative). ? A medicine to make you fall asleep (general anesthetic).  A breathing tube will be placed in your mouth.  Your surgeon will make several small cuts (incisions) in your abdomen.  The laparoscope will be inserted through one of the small incisions. The camera on the laparoscope will send images to a TV screen (monitor) in the operating room. This lets your surgeon see inside your abdomen.  Air-like gas will be pumped into your abdomen. This will expand your abdomen to give the surgeon more room to perform the surgery.  Other tools that are needed for the procedure will be inserted through the other incisions. The gallbladder will be removed through one of the incisions.  Your common bile duct may be examined. If stones   are found in the common bile duct, they may be removed.  After your gallbladder has been removed, the incisions will be closed with stitches (sutures), staples, or skin glue.  Your incisions may be covered with a bandage (dressing). The procedure may vary among health care providers and  hospitals. What happens after the procedure?  Your blood pressure, heart rate, breathing rate, and blood oxygen level will be monitored until the medicines you were given have worn off.  You will be given medicines as needed to control your pain.  Do not drive for 24 hours if you were given a sedative. This information is not intended to replace advice given to you by your health care provider. Make sure you discuss any questions you have with your health care provider. Document Released: 03/21/2005 Document Revised: 02/16/2017 Document Reviewed: 09/07/2015 Elsevier Interactive Patient Education  2019 Elsevier Inc.  

## 2018-06-06 NOTE — Pre-Procedure Instructions (Deleted)
In for PAT. Patient states he is going to ride [employer] home. I explained to him he could do that, but would need someone to ride home with him. He said he didn't have anyone who could do it this day. He is going to call the office to reschedule his procedure.

## 2018-06-07 ENCOUNTER — Encounter (HOSPITAL_COMMUNITY): Payer: Self-pay

## 2018-06-07 ENCOUNTER — Encounter (HOSPITAL_COMMUNITY)
Admission: RE | Admit: 2018-06-07 | Discharge: 2018-06-07 | Disposition: A | Discharge status: Home / Self Care | Payer: Medicaid Other | Source: Ambulatory Visit | Attending: General Surgery | Admitting: General Surgery

## 2018-06-07 ENCOUNTER — Other Ambulatory Visit: Payer: Self-pay

## 2018-06-07 DIAGNOSIS — Z01812 Encounter for preprocedural laboratory examination: Secondary | ICD-10-CM | POA: Insufficient documentation

## 2018-06-07 HISTORY — DX: Unspecified convulsions: R56.9

## 2018-06-07 NOTE — Patient Instructions (Signed)
Alejandra Sanders  06/07/2018     @PREFPERIOPPHARMACY @   Your procedure is scheduled on  06/11/2018.  Report to [facility] at  950  A.M.  Call this number if you have problems the morning of surgery:  [phone]   Remember:  Do not eat or drink after midnight.                        Take these medicines the morning of surgery with A SIP OF WATER  Amlodipine, plaquenil, mobic(if needed), oxycodone(if needed).    Do not wear jewelry, make-up or nail polish.  Do not wear lotions, powders, or perfumes, or deodorant.  Do not shave 48 hours prior to surgery.  Men may shave face and neck.  Do not bring valuables to the hospital.  [facility] is not responsible for any belongings or valuables.  Contacts, dentures or bridgework may not be worn into surgery.  Leave your suitcase in the car.  After surgery it may be brought to your room.  For patients admitted to the hospital, discharge time will be determined by your treatment team.  Patients discharged the day of surgery will not be allowed to drive home.   Name and phone number of your driver:   family Special instructions:  None  Please read over the following fact sheets that you were given. Anesthesia Post-op Instructions and Care and Recovery After Surgery       Laparoscopic Cholecystectomy, Care After This sheet gives you information about how to care for yourself after your procedure. Your health care provider may also give you more specific instructions. If you have problems or questions, contact your health care provider. What can I expect after the procedure? After the procedure, it is common to have:  Pain at your incision sites. You will be given medicines to control this pain.  Mild nausea or vomiting.  Bloating and possible shoulder pain from the air-like gas that was used during the procedure. Follow these instructions at home: Incision care   Follow instructions from your health care provider about  how to take care of your incisions. Make sure you: ? Wash your hands with soap and water before you change your bandage (dressing). If soap and water are not available, use hand sanitizer. ? Change your dressing as told by your health care provider. ? Leave stitches (sutures), skin glue, or adhesive strips in place. These skin closures may need to be in place for 2 weeks or longer. If adhesive strip edges start to loosen and curl up, you may trim the loose edges. Do not remove adhesive strips completely unless your health care provider tells you to do that.  Do not take baths, swim, or use a hot tub until your health care provider approves. Ask your health care provider if you can take showers. You may only be allowed to take sponge baths for bathing.  Check your incision area every day for signs of infection. Check for: ? More redness, swelling, or pain. ? More fluid or blood. ? Warmth. ? Pus or a bad smell. Activity  Do not drive or use heavy machinery while taking prescription pain medicine.  Do not lift anything that is heavier than 10 lb (4.5 kg) until your health care provider approves.  Do not play contact sports until your health care provider approves.  Do not drive for 24 hours if you were given a medicine to help you  relax (sedative).  Rest as needed. Do not return to work or school until your health care provider approves. General instructions  Take over-the-counter and prescription medicines only as told by your health care provider.  To prevent or treat constipation while you are taking prescription pain medicine, your health care provider may recommend that you: ? Drink enough fluid to keep your urine clear or pale yellow. ? Take over-the-counter or prescription medicines. ? Eat foods that are high in fiber, such as fresh fruits and vegetables, whole grains, and beans. ? Limit foods that are high in fat and processed sugars, such as fried and sweet foods. Contact a  health care provider if:  You develop a rash.  You have more redness, swelling, or pain around your incisions.  You have more fluid or blood coming from your incisions.  Your incisions feel warm to the touch.  You have pus or a bad smell coming from your incisions.  You have a fever.  One or more of your incisions breaks open. Get help right away if:  You have trouble breathing.  You have chest pain.  You have increasing pain in your shoulders.  You faint or feel dizzy when you stand.  You have severe pain in your abdomen.  You have nausea or vomiting that lasts for more than one day.  You have leg pain. This information is not intended to replace advice given to you by your health care provider. Make sure you discuss any questions you have with your health care provider. Document Released: 03/21/2005 Document Revised: 10/10/2015 Document Reviewed: 09/07/2015 Elsevier Interactive Patient Education  2019 Elsevier Inc. General Anesthesia, Adult, Care After This sheet gives you information about how to care for yourself after your procedure. Your health care provider may also give you more specific instructions. If you have problems or questions, contact your health care provider. What can I expect after the procedure? After the procedure, the following side effects are common:  Pain or discomfort at the IV site.  Nausea.  Vomiting.  Sore throat.  Trouble concentrating.  Feeling cold or chills.  Weak or tired.  Sleepiness and fatigue.  Soreness and body aches. These side effects can affect parts of the body that were not involved in surgery. Follow these instructions at home:  For at least 24 hours after the procedure:  Have a responsible adult stay with you. It is important to have someone help care for you until you are awake and alert.  Rest as needed.  Do not: ? Participate in activities in which you could fall or become injured. ? Drive. ? Use  heavy machinery. ? Drink alcohol. ? Take sleeping pills or medicines that cause drowsiness. ? Make important decisions or sign legal documents. ? Take care of children on your own. Eating and drinking  Follow any instructions from your health care provider about eating or drinking restrictions.  When you feel hungry, start by eating small amounts of foods that are soft and easy to digest (bland), such as toast. Gradually return to your regular diet.  Drink enough fluid to keep your urine pale yellow.  If you vomit, rehydrate by drinking water, juice, or clear broth. General instructions  If you have sleep apnea, surgery and certain medicines can increase your risk for breathing problems. Follow instructions from your health care provider about wearing your sleep device: ? Anytime you are sleeping, including during daytime naps. ? While taking prescription pain medicines, sleeping medicines, or medicines that  make you drowsy.  Return to your normal activities as told by your health care provider. Ask your health care provider what activities are safe for you.  Take over-the-counter and prescription medicines only as told by your health care provider.  If you smoke, do not smoke without supervision.  Keep all follow-up visits as told by your health care provider. This is important. Contact a health care provider if:  You have nausea or vomiting that does not get better with medicine.  You cannot eat or drink without vomiting.  You have pain that does not get better with medicine.  You are unable to pass urine.  You develop a skin rash.  You have a fever.  You have redness around your IV site that gets worse. Get help right away if:  You have difficulty breathing.  You have chest pain.  You have blood in your urine or stool, or you vomit blood. Summary  After the procedure, it is common to have a sore throat or nausea. It is also common to feel tired.  Have a  responsible adult stay with you for the first 24 hours after general anesthesia. It is important to have someone help care for you until you are awake and alert.  When you feel hungry, start by eating small amounts of foods that are soft and easy to digest (bland), such as toast. Gradually return to your regular diet.  Drink enough fluid to keep your urine pale yellow.  Return to your normal activities as told by your health care provider. Ask your health care provider what activities are safe for you. This information is not intended to replace advice given to you by your health care provider. Make sure you discuss any questions you have with your health care provider. Document Released: 06/27/2000 Document Revised: 11/04/2016 Document Reviewed: 11/04/2016 Elsevier Interactive Patient Education  2019 [employer]

## 2018-06-11 ENCOUNTER — Ambulatory Visit (HOSPITAL_COMMUNITY): Payer: Medicaid Other | Admitting: Certified Registered Nurse Anesthetist

## 2018-06-11 ENCOUNTER — Observation Stay (HOSPITAL_COMMUNITY)
Admission: RE | Admit: 2018-06-11 | Discharge: 2018-06-12 | Disposition: A | Discharge status: Home / Self Care | Payer: Medicaid Other | Attending: General Surgery | Admitting: General Surgery

## 2018-06-11 ENCOUNTER — Other Ambulatory Visit: Payer: Self-pay

## 2018-06-11 ENCOUNTER — Encounter (HOSPITAL_COMMUNITY)
Admission: RE | Disposition: A | Discharge status: Home / Self Care | Payer: Self-pay | Source: Home / Self Care | Attending: General Surgery

## 2018-06-11 ENCOUNTER — Encounter (HOSPITAL_COMMUNITY): Payer: Self-pay | Admitting: Anesthesiology

## 2018-06-11 DIAGNOSIS — Z791 Long term (current) use of non-steroidal anti-inflammatories (NSAID): Secondary | ICD-10-CM | POA: Insufficient documentation

## 2018-06-11 DIAGNOSIS — K811 Chronic cholecystitis: Secondary | ICD-10-CM | POA: Diagnosis present

## 2018-06-11 DIAGNOSIS — Z79899 Other long term (current) drug therapy: Secondary | ICD-10-CM | POA: Diagnosis not present

## 2018-06-11 DIAGNOSIS — M069 Rheumatoid arthritis, unspecified: Secondary | ICD-10-CM | POA: Insufficient documentation

## 2018-06-11 DIAGNOSIS — K8020 Calculus of gallbladder without cholecystitis without obstruction: Secondary | ICD-10-CM | POA: Diagnosis present

## 2018-06-11 DIAGNOSIS — I10 Essential (primary) hypertension: Secondary | ICD-10-CM | POA: Diagnosis not present

## 2018-06-11 DIAGNOSIS — F17210 Nicotine dependence, cigarettes, uncomplicated: Secondary | ICD-10-CM | POA: Insufficient documentation

## 2018-06-11 DIAGNOSIS — K8010 Calculus of gallbladder with chronic cholecystitis without obstruction: Secondary | ICD-10-CM

## 2018-06-11 DIAGNOSIS — F17200 Nicotine dependence, unspecified, uncomplicated: Secondary | ICD-10-CM | POA: Diagnosis not present

## 2018-06-11 DIAGNOSIS — K8012 Calculus of gallbladder with acute and chronic cholecystitis without obstruction: Secondary | ICD-10-CM | POA: Diagnosis not present

## 2018-06-11 HISTORY — PX: GALLBLADDER SURGERY: SHX652

## 2018-06-11 HISTORY — DX: Chronic cholecystitis: K81.1

## 2018-06-11 HISTORY — PX: CHOLECYSTECTOMY: SHX55

## 2018-06-11 SURGERY — LAPAROSCOPIC CHOLECYSTECTOMY
Anesthesia: General

## 2018-06-11 MED ORDER — SODIUM CHLORIDE 0.9 % IV SOLN
2.0000 g | INTRAVENOUS | Status: AC
  Administered 2018-06-11: 2 g via INTRAVENOUS
  Filled 2018-06-11: qty 2

## 2018-06-11 MED ORDER — PROMETHAZINE HCL 25 MG/ML IJ SOLN: 6.2500 mg | INTRAMUSCULAR | Status: DC | PRN

## 2018-06-11 MED ORDER — DOCUSATE SODIUM 100 MG PO CAPS
100.0000 mg | ORAL_CAPSULE | Freq: Two times a day (BID) | ORAL | Status: DC
  Administered 2018-06-11 – 2018-06-12 (×3): 100 mg via ORAL
  Filled 2018-06-11 (×3): qty 1

## 2018-06-11 MED ORDER — SODIUM CHLORIDE 0.9 % IR SOLN
Status: DC | PRN
  Administered 2018-06-11: 1000 mL

## 2018-06-11 MED ORDER — ESMOLOL HCL 100 MG/10ML IV SOLN
INTRAVENOUS | Status: DC | PRN
  Administered 2018-06-11: 5 mg via INTRAVENOUS

## 2018-06-11 MED ORDER — CHLORHEXIDINE GLUCONATE CLOTH 2 % EX PADS: 6.0000 | MEDICATED_PAD | Freq: Once | CUTANEOUS | Status: DC

## 2018-06-11 MED ORDER — HYDROCODONE-ACETAMINOPHEN 7.5-325 MG PO TABS: 1.0000 | ORAL_TABLET | Freq: Once | ORAL | Status: DC | PRN

## 2018-06-11 MED ORDER — MORPHINE SULFATE (PF) 2 MG/ML IV SOLN: 2.0000 mg | INTRAVENOUS | Status: DC | PRN

## 2018-06-11 MED ORDER — ROCURONIUM BROMIDE 10 MG/ML (PF) SYRINGE
PREFILLED_SYRINGE | INTRAVENOUS | Status: AC
  Filled 2018-06-11: qty 10

## 2018-06-11 MED ORDER — ROCURONIUM BROMIDE 100 MG/10ML IV SOLN
INTRAVENOUS | Status: DC | PRN
  Administered 2018-06-11: 35 mg via INTRAVENOUS

## 2018-06-11 MED ORDER — HEMOSTATIC AGENTS (NO CHARGE) OPTIME
TOPICAL | Status: DC | PRN
  Administered 2018-06-11 (×6): 1 via TOPICAL

## 2018-06-11 MED ORDER — ONDANSETRON 4 MG PO TBDP: 4.0000 mg | ORAL_TABLET | Freq: Four times a day (QID) | ORAL | Status: DC | PRN

## 2018-06-11 MED ORDER — FENTANYL CITRATE (PF) 100 MCG/2ML IJ SOLN
INTRAMUSCULAR | Status: AC
  Filled 2018-06-11: qty 4

## 2018-06-11 MED ORDER — FENTANYL CITRATE (PF) 100 MCG/2ML IJ SOLN
INTRAMUSCULAR | Status: AC
  Filled 2018-06-11: qty 2

## 2018-06-11 MED ORDER — HYDROMORPHONE HCL 1 MG/ML IJ SOLN: 0.2500 mg | INTRAMUSCULAR | Status: DC | PRN

## 2018-06-11 MED ORDER — ONDANSETRON HCL 4 MG/2ML IJ SOLN
INTRAMUSCULAR | Status: DC | PRN
  Administered 2018-06-11: 4 mg via INTRAVENOUS

## 2018-06-11 MED ORDER — SUGAMMADEX SODIUM 200 MG/2ML IV SOLN
INTRAVENOUS | Status: AC
  Filled 2018-06-11: qty 2

## 2018-06-11 MED ORDER — FENTANYL CITRATE (PF) 100 MCG/2ML IJ SOLN
INTRAMUSCULAR | Status: DC | PRN
  Administered 2018-06-11 (×6): 50 ug via INTRAVENOUS

## 2018-06-11 MED ORDER — PROPOFOL 10 MG/ML IV BOLUS
INTRAVENOUS | Status: DC | PRN
  Administered 2018-06-11: 160 mg via INTRAVENOUS
  Administered 2018-06-11: 40 mg via INTRAVENOUS

## 2018-06-11 MED ORDER — BUPIVACAINE HCL (PF) 0.5 % IJ SOLN
INTRAMUSCULAR | Status: DC | PRN
  Administered 2018-06-11: 10 mL

## 2018-06-11 MED ORDER — SODIUM CHLORIDE 0.9 % IR SOLN
Status: DC | PRN
  Administered 2018-06-11: 3000 mL

## 2018-06-11 MED ORDER — BUPIVACAINE HCL (PF) 0.5 % IJ SOLN
INTRAMUSCULAR | Status: AC
  Filled 2018-06-11: qty 30

## 2018-06-11 MED ORDER — MIDAZOLAM HCL 5 MG/5ML IJ SOLN
INTRAMUSCULAR | Status: DC | PRN
  Administered 2018-06-11: 2 mg via INTRAVENOUS

## 2018-06-11 MED ORDER — DEXAMETHASONE SODIUM PHOSPHATE 10 MG/ML IJ SOLN
INTRAMUSCULAR | Status: AC
  Filled 2018-06-11: qty 1

## 2018-06-11 MED ORDER — SUCCINYLCHOLINE CHLORIDE 20 MG/ML IJ SOLN
INTRAMUSCULAR | Status: DC | PRN
  Administered 2018-06-11: 175 mg via INTRAVENOUS

## 2018-06-11 MED ORDER — OXYCODONE HCL 5 MG PO TABS
5.0000 mg | ORAL_TABLET | ORAL | Status: DC | PRN
  Administered 2018-06-11: 5 mg via ORAL
  Filled 2018-06-11: qty 1

## 2018-06-11 MED ORDER — ESMOLOL HCL 100 MG/10ML IV SOLN
INTRAVENOUS | Status: AC
  Filled 2018-06-11: qty 10

## 2018-06-11 MED ORDER — SUGAMMADEX SODIUM 200 MG/2ML IV SOLN
INTRAVENOUS | Status: DC | PRN
  Administered 2018-06-11: 169.8 mg via INTRAVENOUS

## 2018-06-11 MED ORDER — LACTATED RINGERS IV SOLN
INTRAVENOUS | Status: DC
  Administered 2018-06-11 (×2): via INTRAVENOUS

## 2018-06-11 MED ORDER — LABETALOL HCL 5 MG/ML IV SOLN
INTRAVENOUS | Status: DC | PRN
  Administered 2018-06-11 (×2): 5 mg via INTRAVENOUS

## 2018-06-11 MED ORDER — LIDOCAINE 2% (20 MG/ML) 5 ML SYRINGE
INTRAMUSCULAR | Status: AC
  Filled 2018-06-11: qty 5

## 2018-06-11 MED ORDER — DIPHENHYDRAMINE HCL 12.5 MG/5ML PO ELIX
12.5000 mg | ORAL_SOLUTION | Freq: Four times a day (QID) | ORAL | Status: DC | PRN
  Filled 2018-06-11: qty 5

## 2018-06-11 MED ORDER — ARTIFICIAL TEARS OPHTHALMIC OINT
TOPICAL_OINTMENT | OPHTHALMIC | Status: AC
  Filled 2018-06-11: qty 3.5

## 2018-06-11 MED ORDER — ONDANSETRON HCL 4 MG/2ML IJ SOLN: 4.0000 mg | Freq: Four times a day (QID) | INTRAMUSCULAR | Status: DC | PRN

## 2018-06-11 MED ORDER — MIDAZOLAM HCL 2 MG/2ML IJ SOLN
INTRAMUSCULAR | Status: AC
  Filled 2018-06-11: qty 2

## 2018-06-11 MED ORDER — MIDAZOLAM HCL 2 MG/2ML IJ SOLN: 0.5000 mg | Freq: Once | INTRAMUSCULAR | Status: DC | PRN

## 2018-06-11 MED ORDER — DIPHENHYDRAMINE HCL 50 MG/ML IJ SOLN: 12.5000 mg | Freq: Four times a day (QID) | INTRAMUSCULAR | Status: DC | PRN

## 2018-06-11 MED ORDER — SIMETHICONE 80 MG PO CHEW: 40.0000 mg | CHEWABLE_TABLET | Freq: Four times a day (QID) | ORAL | Status: DC | PRN

## 2018-06-11 MED ORDER — DEXAMETHASONE SODIUM PHOSPHATE 10 MG/ML IJ SOLN
INTRAMUSCULAR | Status: DC | PRN
  Administered 2018-06-11: 5 mg via INTRAVENOUS

## 2018-06-11 MED ORDER — ONDANSETRON HCL 4 MG/2ML IJ SOLN
INTRAMUSCULAR | Status: AC
  Filled 2018-06-11: qty 4

## 2018-06-11 SURGICAL SUPPLY — 58 items
APPLICATOR ARISTA FLEXITIP XL (MISCELLANEOUS) ×2 IMPLANT
APPLIER CLIP ROT 10 11.4 M/L (STAPLE) ×2
BAG RETRIEVAL 10 (BASKET) ×1
BLADE SURG 15 STRL LF DISP TIS (BLADE) ×1 IMPLANT
BLADE SURG 15 STRL SS (BLADE) ×1
CHLORAPREP W/TINT 26ML (MISCELLANEOUS) ×2 IMPLANT
CLIP APPLIE ROT 10 11.4 M/L (STAPLE) ×1 IMPLANT
CLOTH BEACON ORANGE TIMEOUT ST (SAFETY) ×2 IMPLANT
COVER LIGHT HANDLE STERIS (MISCELLANEOUS) ×4 IMPLANT
CUTTER FLEX LINEAR 45M (STAPLE) ×2 IMPLANT
DECANTER SPIKE VIAL GLASS SM (MISCELLANEOUS) ×2 IMPLANT
DERMABOND ADVANCED (GAUZE/BANDAGES/DRESSINGS) ×1
DERMABOND ADVANCED .7 DNX12 (GAUZE/BANDAGES/DRESSINGS) ×1 IMPLANT
DRSG GAUZE PETRO 3X36 STRIP (GAUZE/BANDAGES/DRESSINGS) ×2 IMPLANT
ELECT REM PT RETURN 9FT ADLT (ELECTROSURGICAL) ×2
ELECTRODE REM PT RTRN 9FT ADLT (ELECTROSURGICAL) ×1 IMPLANT
EVACUATOR DRAINAGE 10X20 100CC (DRAIN) ×1 IMPLANT
EVACUATOR SILICONE 100CC (DRAIN) ×1
FILTER SMOKE EVAC LAPAROSHD (FILTER) ×2 IMPLANT
GLOVE BIO SURGEON STRL SZ 6.5 (GLOVE) ×6 IMPLANT
GLOVE BIO SURGEON STRL SZ7 (GLOVE) ×2 IMPLANT
GLOVE BIO SURGEON STRL SZ7.5 (GLOVE) ×2 IMPLANT
GLOVE BIOGEL PI IND STRL 6.5 (GLOVE) ×3 IMPLANT
GLOVE BIOGEL PI IND STRL 7.0 (GLOVE) ×5 IMPLANT
GLOVE BIOGEL PI INDICATOR 6.5 (GLOVE) ×3
GLOVE BIOGEL PI INDICATOR 7.0 (GLOVE) ×5
GLOVE ECLIPSE 6.5 STRL STRAW (GLOVE) ×2 IMPLANT
GLOVE SURG SS PI 7.5 STRL IVOR (GLOVE) ×2 IMPLANT
GOWN STRL REUS W/ TWL LRG LVL3 (GOWN DISPOSABLE) ×1 IMPLANT
GOWN STRL REUS W/ TWL XL LVL3 (GOWN DISPOSABLE) ×1 IMPLANT
GOWN STRL REUS W/TWL LRG LVL3 (GOWN DISPOSABLE) ×9 IMPLANT
GOWN STRL REUS W/TWL XL LVL3 (GOWN DISPOSABLE) ×1
HEMOSTAT ARISTA ABSORB 3G PWDR (HEMOSTASIS) ×2 IMPLANT
HEMOSTAT SNOW SURGICEL 2X4 (HEMOSTASIS) ×4 IMPLANT
HEMOSTAT SURGICEL 4X8 (HEMOSTASIS) ×6 IMPLANT
INST SET LAPROSCOPIC AP (KITS) ×2 IMPLANT
IV NS IRRIG 3000ML ARTHROMATIC (IV SOLUTION) ×2 IMPLANT
KIT TURNOVER KIT A (KITS) ×2 IMPLANT
MANIFOLD NEPTUNE II (INSTRUMENTS) ×2 IMPLANT
NEEDLE INSUFFLATION 14GA 120MM (NEEDLE) ×2 IMPLANT
NS IRRIG 1000ML POUR BTL (IV SOLUTION) ×2 IMPLANT
PACK LAP CHOLE LZT030E (CUSTOM PROCEDURE TRAY) ×2 IMPLANT
PAD ARMBOARD 7.5X6 YLW CONV (MISCELLANEOUS) ×2 IMPLANT
RELOAD 45 VASCULAR/THIN (ENDOMECHANICALS) ×2 IMPLANT
SET BASIN LINEN APH (SET/KITS/TRAYS/PACK) ×2 IMPLANT
SET TUBE IRRIG SUCTION NO TIP (IRRIGATION / IRRIGATOR) ×2 IMPLANT
SLEEVE ENDOPATH XCEL 5M (ENDOMECHANICALS) ×2 IMPLANT
SUT ETHILON 3 0 FSL (SUTURE) ×2 IMPLANT
SUT MNCRL AB 4-0 PS2 18 (SUTURE) ×2 IMPLANT
SUT VICRYL 0 UR6 27IN ABS (SUTURE) ×2 IMPLANT
SYS BAG RETRIEVAL 10MM (BASKET) ×1
SYSTEM BAG RETRIEVAL 10MM (BASKET) ×1 IMPLANT
TROCAR ENDO BLADELESS 11MM (ENDOMECHANICALS) ×2 IMPLANT
TROCAR XCEL NON-BLD 5MMX100MML (ENDOMECHANICALS) ×2 IMPLANT
TROCAR XCEL UNIV SLVE 11M 100M (ENDOMECHANICALS) ×2 IMPLANT
TUBE CONNECTING 12X1/4 (SUCTIONS) ×2 IMPLANT
TUBING INSUFFLATION (TUBING) ×2 IMPLANT
WARMER LAPAROSCOPE (MISCELLANEOUS) ×2 IMPLANT

## 2018-06-11 NOTE — Anesthesia Postprocedure Evaluation (Signed)
Anesthesia Post Note  Patient: Alejandra Sanders  Procedure(s) Performed: LAPAROSCOPIC CHOLECYSTECTOMY (N/A )  Patient location during evaluation: PACU Anesthesia Type: General Level of consciousness: awake Pain management: pain level controlled Vital Signs Assessment: post-procedure vital signs reviewed and stable Respiratory status: spontaneous breathing, nonlabored ventilation and respiratory function stable Cardiovascular status: blood pressure returned to baseline Postop Assessment: no apparent nausea or vomiting Anesthetic complications: no     Last Vitals:  Vitals:   06/11/18 1508 06/11/18 1544  BP:  (!) 152/88  Pulse: 79 83  Resp: 17 18  Temp:  36.9 C  SpO2: 100% 96%    Last Pain:  Vitals:   06/11/18 1544  TempSrc: Oral  PainSc:                  [name]

## 2018-06-11 NOTE — Anesthesia Preprocedure Evaluation (Signed)
Anesthesia Evaluation  Patient identified by MRN, date of birth, ID band Patient awake    Reviewed: Allergy & Precautions, NPO status , Patient's Chart, lab work & pertinent test results  Airway Mallampati: II  TM Distance: >3 FB Neck ROM: Full    Dental no notable dental hx. (+) Partial Upper, Chipped, Missing   Pulmonary neg pulmonary ROS, Current Smoker,    Pulmonary exam normal breath sounds clear to auscultation       Cardiovascular Exercise Tolerance: Good hypertension, Pt. on medications negative cardio ROS Normal cardiovascular examI Rhythm:Regular Rate:Normal     Neuro/Psych Seizures -, Well Controlled,  Sz activity as child - none in many years -denies current Sz meds  negative psych ROS   GI/Hepatic negative GI ROS, Neg liver ROS,   Endo/Other  negative endocrine ROS  Renal/GU negative Renal ROS  negative genitourinary   Musculoskeletal  (+) Arthritis , Osteoarthritis,    Abdominal   Peds negative pediatric ROS (+)  Hematology negative hematology ROS (+)   Anesthesia Other Findings   Reproductive/Obstetrics negative OB ROS                             Anesthesia Physical Anesthesia Plan  ASA: II  Anesthesia Plan: General   Post-op Pain Management:    Induction: Intravenous  PONV Risk Score and Plan:   Airway Management Planned: Oral ETT  Additional Equipment:   Intra-op Plan:   Post-operative Plan: Extubation in OR  Informed Consent: I have reviewed the patients History and Physical, chart, labs and discussed the procedure including the risks, benefits and alternatives for the proposed anesthesia with the patient or authorized representative who has indicated his/her understanding and acceptance.     Dental advisory given  Plan Discussed with: CRNA  Anesthesia Plan Comments:         Anesthesia Quick Evaluation

## 2018-06-11 NOTE — Op Note (Signed)
Operative Note   Preoperative Diagnosis: Symptomatic cholelithiasis   Postoperative Diagnosis: Chronic cholecystitis with stones    Procedure(s) Performed: Laparoscopic cholecystectomy   Surgeon: Lillia Abed C. Henreitta Leber, MD   Assistants: No qualified resident was available   Anesthesia: General endotracheal   Anesthesiologist: Dorena Cookey, MD    Specimens: Gallbladder    Estimated Blood Loss: 200cc    Blood Replacement: None    Complications: Superficial hepatic vein at the gallbladder fossa bed, venotomy made but could not be controlled with clips or cautery. Packed and hemostasis achieved. Drain placed to monitor.    Operative Findings: Inflamed gallbladder with stones    Procedure: The patient was taken to the operating room and placed supine. General endotracheal anesthesia was induced. Intravenous antibiotics were administered per protocol. An orogastric tube positioned to decompress the stomach. The abdomen was prepared and draped in the usual sterile fashion.    A supraumbilical incision was made and a Veress technique was utilized to achieve pneumoperitoneum to 15 mmHg with carbon dioxide. A 11 mm optiview port was placed through the supraumbilical region, and a 10 mm 0-degree operative laparoscope was introduced. The area underlying the trocar and Veress needle were inspected and without evidence of injury.  Remaining trocars were placed under direct vision. Two 5 mm ports were placed in the right abdomen, between the anterior axillary and midclavicular line.  A final 11 mm port was placed through the mid-epigastrium, near the falciform ligament.    The gallbladder was inflamed and edematous. She had some degree of chronic cholecystitis, and there were significant adhesions of the omentum to the gallbladder. These were taken down with electrocautery.  The fundus was elevated cephalad and the infundibulum was retracted to the patient's right. The gallbladder was long and  retracted down from the inflammation. Using great care the lateral and medial aspect of the gallbaldder were dissected off the liver to help aid in mobilization.  This helped to allow for better retraction of the infundibulum. After electrocautery dissection and blunt dissection. I was able to skeletonize the cystic artery as it was coursing into the gallbladder from the hepatic artery. I did not want this to tear and bleed, so this was clipped and divided to allow for further mobilization of the infundibulum as I could not tell if the common duct was potentially adherent to the infundibulum and diving back. Once the artery was clipped and divided, it was clear that the infundibulum was folded over on the cystic duct.  I cleared from the cystic duct back to the hepatic plate and connected the lateral mobilization points to demonstrate that a single structure, the cystic duct, went into the gallbladder.  The cystic duct was very inflamed and thickened, and I did not think that clips would hold. A vascular staple load was placed into the epigastric port site and the cystic duct was divided at the infundibulum. The gallbladder was then dissected from the liver bed with electrocautery. After dissecting the mesentery off at about the half way point up the gallbladder fossa there was an area of bleeding that almost appeared to bubble up in nature. This was not constant or pulsatile, but almost seemed to be coming from a venotomy about 48mm in size. I could not dissect out any veins, and we were clearly well above the portal vein and the porta hepatis as we were in the gallbladder fossa.  I was not even in the liver parenchyma at this point.  It looked to me  that a hepatic vein was very superficial and a venotomy had been made. I tried to clip the area but this did not hold.  I then packed the area with two pieces of Surgicel and sprayed Arista and put Surgical Snow over the area.  The gallbladder was dissected off the  remaining hepatic bed at the doom, and the specimen was placed in an Endopouch and was retrieved through the epigastric site.   I then desulfated and called my partner as well as a third trauma surgeon to get their opinion. Both agreed that this was likely a superficial hepatic vein and digging into the liver to attempt to clip was likely to make things worse.  They agreed with packing and pressure.  The trauma surgeon also felt that this would be similar to a grade I liver laceration and should be monitored. The desuflation was about 5 minutes of time.  I reinsufflated, and the hemostatic agents were still partially virgin from any blood saturation.  I proceeded to place a third piece of Surgicel and a second piece of Surgical Snow.  A JP drain was pulled out of the lateral most drain and placed in the porta hepatis area.     Final inspection revealed acceptable hemostasis. Trocars were removed and pneumoperitoneum was released. 0 Vicryl fascial sutures were used to close the epigastric port site and the umbilical site was too small to close.  Skin incisions were closed with 4-0 Monocryl subcuticular sutures and Dermabond. The patient was awakened from anesthesia and extubated without complication.  She will remain in the hospital for monitoring to assure that this venous bleed has been controlled.     Algis GreenhouseLindsay Nicola Quesnell, MD [facility] [address] [place], [place] [id number] [phone] (office)

## 2018-06-11 NOTE — Anesthesia Procedure Notes (Signed)
Procedure Name: Intubation Date/Time: 06/11/2018 12:24 PM Performed by: Charmaine Downs, CRNA Pre-anesthesia Checklist: Patient identified, Emergency Drugs available, Suction available and Patient being monitored Patient Re-evaluated:Patient Re-evaluated prior to induction Oxygen Delivery Method: Circle system utilized Preoxygenation: Pre-oxygenation with 100% oxygen Induction Type: IV induction, Cricoid Pressure applied and Rapid sequence Ventilation: Mask ventilation without difficulty Laryngoscope Size: Mac and 3 Grade View: Grade II Tube size: 7.0 mm Number of attempts: 2 Airway Equipment and Method: Rigid stylet and Video-laryngoscopy Placement Confirmation: ETT inserted through vocal cords under direct vision Secured at: 22 cm Tube secured with: Tape Dental Injury: Bloody posterior oropharynx  Difficulty Due To: Difficulty was anticipated, Difficult Airway- due to dentition and Difficult Airway- due to anterior larynx Future Recommendations: Recommend- induction with short-acting agent, and alternative techniques readily available Comments: Unable to visualize with 3 Mac; Lo-Pro 3 glidescope used per DR Nabonsal.Small amount blood in posterior oropharynx.VSS throughout intubation.BBS

## 2018-06-11 NOTE — Progress Notes (Signed)
[facility]  Hurting some. Mild nausea.  BP (!) 152/88 (BP Location: Right Arm)   Pulse 83   Temp 98.5 F (36.9 C) (Oral)   Resp 18   Ht 5\' 5"  (1.651 m)   Wt 86.7 kg   SpO2 96%   BMI 31.81 kg/m  NAD  Soft, tender RUQ, JP with minimal bloody fluid, no rebound or guarding  Patient POD 0 s/p Lap chole, with some venous bleeding from the gallbladder fossa. Admitted for monitoring. JP in place. Packing to the gallbladder fossa performed.   Clears now. Will monitor overnight and monitor JP output CBC in the AM  Algis Greenhouse, MD [facility] [address] [place], [place] [address] [phone] (office)

## 2018-06-11 NOTE — Transfer of Care (Signed)
Immediate Anesthesia Transfer of Care Note  Patient: Alejandra Sanders  Procedure(s) Performed: LAPAROSCOPIC CHOLECYSTECTOMY (N/A )  Patient Location: PACU  Anesthesia Type:General  Level of Consciousness: drowsy and patient cooperative  Airway & Oxygen Therapy: Patient Spontanous Breathing  Post-op Assessment: Report given to RN and Post -op Vital signs reviewed and stable  Post vital signs: Reviewed and stable  Last Vitals:  Vitals Value Taken Time  BP    Temp    Pulse 73 06/11/2018  2:44 PM  Resp 8 06/11/2018  2:45 PM  SpO2 83 % 06/11/2018  2:44 PM  Vitals shown include unvalidated device data.  Last Pain:  Vitals:   06/11/18 1032  PainSc: 0-No pain         Complications: No apparent anesthesia complications

## 2018-06-11 NOTE — Interval H&P Note (Signed)
History and Physical Interval Note:  06/11/2018 12:02 PM  Alejandra Sanders  has presented today for surgery, with the diagnosis of cholelithiasis.  The various methods of treatment have been discussed with the patient and family. After consideration of risks, benefits and other options for treatment, the patient has consented to  Procedure(s): LAPAROSCOPIC CHOLECYSTECTOMY POSSIBLE OPEN (N/A) as a surgical intervention.  The patient's history has been reviewed, patient examined, no change in status, stable for surgery.  I have reviewed the patient's chart and labs.  Questions were answered to the patient's satisfaction.    No questions or changes.  Lucretia Roers

## 2018-06-12 ENCOUNTER — Encounter (HOSPITAL_COMMUNITY): Payer: Self-pay | Admitting: General Surgery

## 2018-06-12 DIAGNOSIS — K8012 Calculus of gallbladder with acute and chronic cholecystitis without obstruction: Secondary | ICD-10-CM | POA: Diagnosis not present

## 2018-06-12 LAB — CBC
HCT: 36.7 % (ref 36.0–46.0)
Hemoglobin: 11.4 g/dL — ABNORMAL LOW (ref 12.0–15.0)
MCH: 25.9 pg — ABNORMAL LOW (ref 26.0–34.0)
MCHC: 31.1 g/dL (ref 30.0–36.0)
MCV: 83.2 fL (ref 80.0–100.0)
Platelets: 275 10*3/uL (ref 150–400)
RBC: 4.41 MIL/uL (ref 3.87–5.11)
RDW: 14.4 % (ref 11.5–15.5)
WBC: 8.9 10*3/uL (ref 4.0–10.5)
nRBC: 0 % (ref 0.0–0.2)

## 2018-06-12 MED ORDER — OXYCODONE HCL 5 MG PO TABS: 5.0000 mg | ORAL_TABLET | ORAL | 0 refills | Status: DC | PRN

## 2018-06-12 MED ORDER — DOCUSATE SODIUM 100 MG PO CAPS: 100.0000 mg | ORAL_CAPSULE | Freq: Two times a day (BID) | ORAL | 1 refills | Status: DC

## 2018-06-12 MED ORDER — ONDANSETRON 4 MG PO TBDP: 4.0000 mg | ORAL_TABLET | Freq: Four times a day (QID) | ORAL | 0 refills | Status: DC | PRN

## 2018-06-12 MED ORDER — ORENCIA CLICKJECT 125 MG/ML ~~LOC~~ SOAJ: 1.0000 "pen " | SUBCUTANEOUS | Status: AC

## 2018-06-12 NOTE — Discharge Summary (Signed)
Physician Discharge Summary  Patient ID: Alejandra Sanders MRN: [id number] DOB/AGE: 58-01-1961 58 y.o.  Admit date: 06/11/2018 Discharge date: 06/12/2018  Admission Diagnoses: Gallstones   Discharge Diagnoses:  Principal Problem:   Calculus of gallbladder without cholecystitis without obstruction Active Problems:   Calculus of gallbladder with chronic cholecystitis without obstruction   Chronic cholecystitis   Discharged Condition: good  Hospital Course: Alejandra Sanders is a 58 yo with gallstones who presented to my clinic for discussion of gallbladder removal. She has a history of RA on methotrexate and orencia injections, and had missed the previous weeks dose. She was complaining of constant RUQ/ epigastric pain and feelings of bloating and nausea. She had modified her diet, but this did not improve her symptoms. She underwent a laparoscopic cholecystectomy on 06/11/2018. During the case there was some venous bleeding in the gallbladder fossa. This was packed and a JP drain was placed for monitoring. She was kept overnight to ensure that her bleeding had stopped, and monitored. She had minimal output from the JP, 35 cc recorded and about 5cc in the canister. She had minimal pain and was tolerating a regular diet prior to her discharge. Her post operative H&H was down slightly but expected given the bleeding with surgery. She was down to 11.4 from a preoperative hemoglobin of 13.  She had no signs of bleeding, and her BP had been in a range of 110-150 during her hospitalization.   She was told to stay off her Orencia for 2 weeks post operative, and to continue her methotrexate. She was given instructions for returning to the hospital including pain, fever, signs of infection, light headedness/ dizziness.   Consults: None  Significant Diagnostic Studies: Results for Alejandra Sanders, Alejandra Sanders (MRN [id number]) as of 06/12/2018 14:41  Ref. Range 05/27/2018 15:28 05/27/2018 16:05 05/27/2018 19:10 05/28/2018 11:11  06/12/2018 04:40  Hemoglobin Latest Ref Range: 12.0 - 15.0 g/dL 30.0    76.2 (L)  HCT Latest Ref Range: 36.0 - 46.0 % 42.3    36.7   Treatments: Laparoscopic cholecystectomy 06/11/2018 JP drain removed prior to discharge   Discharge Exam: Blood pressure 109/72, pulse 85, temperature 98.9 F (37.2 C), temperature source Oral, resp. rate 16, height 5\' 5"  (1.651 m), weight 86.7 kg, SpO2 97 %. General appearance: alert, cooperative and no distress Resp: normal work of breathing GI: soft, nondistended ,appropriately tender in the RUQ, port sites c/d/i with dermabond, no erythema or drainage, JP drain with some thin serosanginous fluid, no suds, removed without issues and dry dressing placed Extremities: extremities normal, atraumatic, no cyanosis or edema  Disposition: Discharge disposition: 01-Home or Self Care       Discharge Instructions    Call MD for:  difficulty breathing, headache or visual disturbances   Complete by:  As directed    Call MD for:  extreme fatigue   Complete by:  As directed    Call MD for:  persistant dizziness or light-headedness   Complete by:  As directed    Call MD for:  persistant nausea and vomiting   Complete by:  As directed    Call MD for:  redness, tenderness, or signs of infection (pain, swelling, redness, odor or green/yellow discharge around incision site)   Complete by:  As directed    Call MD for:  severe uncontrolled pain   Complete by:  As directed    Call MD for:  temperature >100.4   Complete by:  As directed    Diet - low sodium heart healthy  Complete by:  As directed    Increase activity slowly   Complete by:  As directed      Allergies as of 06/12/2018   No Known Allergies     Medication List    STOP taking these medications   oxyCODONE-acetaminophen 5-325 MG tablet Commonly known as:  Percocet     TAKE these medications   amLODipine 10 MG tablet Commonly known as:  NORVASC Take 10 mg by mouth daily.   docusate sodium  100 MG capsule Commonly known as:  COLACE Take 1 capsule (100 mg total) by mouth 2 (two) times daily.   folic acid 1 MG tablet Commonly known as:  FOLVITE Take 1 mg by mouth daily.   hydroxychloroquine 200 MG tablet Commonly known as:  PLAQUENIL Take 2 tablets by mouth daily.   lisinopril 10 MG tablet Commonly known as:  PRINIVIL,ZESTRIL Take 10 mg by mouth daily.   meloxicam 15 MG tablet Commonly known as:  MOBIC Take 15 mg by mouth daily.   ondansetron 4 MG disintegrating tablet Commonly known as:  ZOFRAN-ODT Take 1 tablet (4 mg total) by mouth every 6 (six) hours as needed for nausea.   Orencia ClickJect 125 MG/ML Soaj Generic drug:  Abatacept Inject 1 pen into the skin once a week. Takes on Tuesdays Start taking on:  June 26, 2018 What changed:  These instructions start on June 26, 2018. If you are unsure what to do until then, ask your doctor or other care provider.   oxyCODONE 5 MG immediate release tablet Commonly known as:  Oxy IR/ROXICODONE Take 1 tablet (5 mg total) by mouth every 4 (four) hours as needed for severe pain or breakthrough pain.   Rasuvo 20 MG/0.4ML Soaj Generic drug:  Methotrexate (PF) Inject 1 pen into the skin once a week. Takes on Tuesdays      Follow-up Information    Lucretia Roers, MD Follow up in 2 week(s).   Specialty:  General Surgery Contact information: [address] [place] [address] [phone]           Signed: Lucretia Roers 06/12/2018, 2:42 PM

## 2018-06-12 NOTE — Discharge Instructions (Signed)
Discharge Laparoscopic Surgery Instructions:  Common Complaints: Right shoulder pain is common after laparoscopic surgery. This is secondary to the gas used in the surgery being trapped under the diaphragm.  Walk to help your body absorb the gas. This will improve in a few days. Pain at the port sites are common, especially the larger port sites. This will improve with time.  Some nausea is common and poor appetite. The main goal is to stay hydrated the first few days after surgery.   Diet/ Activity: Diet as tolerated. You may not have an appetite, but it is important to stay hydrated. Drink 64 ounces of water a day. Your appetite will return with time.  Shower per your regular routine daily.  Do not take hot showers. Take warm showers that are less than 10 minutes. Rest and listen to your body, but do not remain in bed all day.  Walk everyday for at least 15-20 minutes. Deep cough and move around every 1-2 hours in the first few days after surgery.  Do not lift > 10 lbs, perform excessive bending, pushing, pulling, squatting for 1-2 weeks after surgery.  Do not pick at the dermabond glue on your incision sites.  This glue film will remain in place for 1-2 weeks and will start to peel off.  Do not place lotions or balms on your incision unless instructed to specifically by Dr. Henreitta LeberBridges.  DO NOT TAKE YOUR ORENCIA INJECT FOR 2 weeks following Surgery. Do not restart until 06/26/2018.  Medication: Take tylenol and ibuprofen as needed for pain control, alternating every 4-6 hours.  Example:  Tylenol 1000mg  @ 6am, 12noon, 6pm, 12midnight (Do not exceed 4000mg  of tylenol a day). Ibuprofen 800mg  @ 9am, 3pm, 9pm, 3am (Do not exceed 3600mg  of ibuprofen a day).  Take Roxicodone for breakthrough pain every 4 hours.  Take Colace for constipation related to narcotic pain medication. If you do not have a bowel movement in 2 days, take Miralax over the counter.  Drink plenty of water to also prevent  constipation.   Contact Information: If you have questions or concerns, please call our office, [phone], Monday- Thursday 8AM-5PM and Friday 8AM-12Noon.  If it is after hours or on the weekend, please call [facility] Main Number, [phone], and ask to speak to the surgeon on call for Dr. [facility]ridges at [facility].    Laparoscopic Cholecystectomy, Care After This sheet gives you information about how to care for yourself after your procedure. Your doctor may also give you more specific instructions. If you have problems or questions, contact your doctor. Follow these instructions at home: Care for cuts from surgery (incisions)   Follow instructions from your doctor about how to take care of your cuts from surgery. Make sure you: ? Wash your hands with soap and water before you change your bandage (dressing). If you cannot use soap and water, use hand sanitizer. ? Change your bandage as told by your doctor.  Leave stitches (sutures), skin glue, or skin tape (adhesive) strips in place.   Do not take baths, swim, or use a hot tub until your doctor says it is okay. You may shower.  Check your surgical cut area every day for signs of infection. Check for: ? More redness, swelling, or pain. ? More fluid or blood. ? Warmth. ? Pus or a bad smell. Activity  Do not drive or use heavy machinery while taking prescription pain medicine.  Do not lift anything that is heavier than 10 lb (4.5 kg)  until your doctor says it is okay.  Do not play contact sports until your doctor says it is okay.  Do not drive for 24 hours if you were given a medicine to help you relax (sedative).  Rest as needed. Do not return to work or school until your doctor says it is okay. General instructions  Take over-the-counter and prescription medicines only as told by your doctor.  To prevent or treat constipation while you are taking prescription pain medicine, your doctor may recommend that you: ? Drink enough  fluid to keep your pee (urine) clear or pale yellow. ? Take over-the-counter or prescription medicines. ? Eat foods that are high in fiber, such as fresh fruits and vegetables, whole grains, and beans. ? Limit foods that are high in fat and processed sugars, such as fried and sweet foods. Contact a doctor if:  You develop a rash.  You have more redness, swelling, or pain around your surgical cuts.  You have more fluid or blood coming from your surgical cuts.  Your surgical cuts feel warm to the touch.  You have pus or a bad smell coming from your surgical cuts.  You have a fever.  One or more of your surgical cuts breaks open. Get help right away if:  You have trouble breathing.  You have chest pain.  You have pain that is getting worse in your shoulders.  You faint or feel dizzy when you stand.  You have very bad pain in your belly (abdomen).  You are sick to your stomach (nauseous) for more than one day.  You have throwing up (vomiting) that lasts for more than one day.  You have leg pain. This information is not intended to replace advice given to you by your health care provider. Make sure you discuss any questions you have with your health care provider. Document Released: 12/29/2007 Document Revised: 10/10/2015 Document Reviewed: 09/07/2015 Elsevier Interactive Patient Education  2019 [employer].

## 2018-06-12 NOTE — Addendum Note (Signed)
Addendum  created 06/12/18 0829 by Despina Hidden, CRNA   Charge Capture section accepted, Visit diagnoses modified

## 2018-06-13 ENCOUNTER — Telehealth: Payer: Self-pay | Admitting: General Surgery

## 2018-06-13 NOTE — Telephone Encounter (Signed)
[facility]  Diagnosis Gallbladder - ACUTE AND CHRONIC CHOLECYSTITIS WITH CHOLELITHIASIS.  Patient doing well. Having some soreness.  Algis Greenhouse, MD [facility] [address] [place], [place] [address] [phone] (office)

## 2018-06-25 ENCOUNTER — Telehealth: Payer: Self-pay | Admitting: General Surgery

## 2018-06-25 NOTE — Telephone Encounter (Signed)
[facility]  The patient is being called or seen in a Webex encounter due to the current Covid 19 virus and attempts at limiting patient clinic visits in the spirit of social distancing.    Alejandra Sanders underwent a lap cholecystectomy on 06/11/2018. She stayed overnight for monitoring due to some bleeding during the surgery. She had her JP removed and was discharged home on 3/10.   Pathology Diagnosis Gallbladder - ACUTE AND CHRONIC CHOLECYSTITIS WITH CHOLELITHIASIS.  Patient report she is doing good. She is having regular BMs and tolerating a diet. Her port sites are healing well but have scabs, no redness or drainage. Glue is starting to peel up and told her she can trim it off.   Activity and diet as tolerated. Will cancel her appt for tomorrow.   Algis Greenhouse, MD [facility] [address] [place], [place] [address] [phone] (office)

## 2018-06-26 ENCOUNTER — Ambulatory Visit: Payer: Self-pay | Admitting: General Surgery

## 2020-02-12 DIAGNOSIS — M2550 Pain in unspecified joint: Secondary | ICD-10-CM | POA: Diagnosis not present

## 2020-02-12 DIAGNOSIS — R5382 Chronic fatigue, unspecified: Secondary | ICD-10-CM | POA: Diagnosis not present

## 2020-02-12 DIAGNOSIS — I119 Hypertensive heart disease without heart failure: Secondary | ICD-10-CM | POA: Diagnosis not present

## 2020-02-22 DIAGNOSIS — Z23 Encounter for immunization: Secondary | ICD-10-CM | POA: Diagnosis not present

## 2020-06-05 DIAGNOSIS — I119 Hypertensive heart disease without heart failure: Secondary | ICD-10-CM | POA: Diagnosis not present

## 2020-06-05 DIAGNOSIS — K219 Gastro-esophageal reflux disease without esophagitis: Secondary | ICD-10-CM | POA: Diagnosis not present

## 2020-06-05 DIAGNOSIS — E559 Vitamin D deficiency, unspecified: Secondary | ICD-10-CM | POA: Diagnosis not present

## 2020-06-05 DIAGNOSIS — M069 Rheumatoid arthritis, unspecified: Secondary | ICD-10-CM | POA: Diagnosis not present

## 2020-06-16 DIAGNOSIS — M7551 Bursitis of right shoulder: Secondary | ICD-10-CM | POA: Diagnosis not present

## 2020-06-16 DIAGNOSIS — G609 Hereditary and idiopathic neuropathy, unspecified: Secondary | ICD-10-CM | POA: Diagnosis not present

## 2020-06-16 DIAGNOSIS — M2559 Pain in other specified joint: Secondary | ICD-10-CM | POA: Diagnosis not present

## 2020-06-16 DIAGNOSIS — I110 Hypertensive heart disease with heart failure: Secondary | ICD-10-CM | POA: Diagnosis not present

## 2020-08-18 DIAGNOSIS — E559 Vitamin D deficiency, unspecified: Secondary | ICD-10-CM | POA: Diagnosis not present

## 2020-08-18 DIAGNOSIS — G8929 Other chronic pain: Secondary | ICD-10-CM | POA: Diagnosis not present

## 2020-08-18 DIAGNOSIS — I119 Hypertensive heart disease without heart failure: Secondary | ICD-10-CM | POA: Diagnosis not present

## 2020-08-19 ENCOUNTER — Other Ambulatory Visit (HOSPITAL_COMMUNITY): Payer: Self-pay | Admitting: Family Medicine

## 2020-08-19 ENCOUNTER — Ambulatory Visit (HOSPITAL_COMMUNITY)
Admission: RE | Admit: 2020-08-19 | Discharge: 2020-08-19 | Disposition: A | Discharge status: Home / Self Care | Payer: Medicaid Other | Source: Ambulatory Visit | Attending: Family Medicine | Admitting: Family Medicine

## 2020-08-19 DIAGNOSIS — M25511 Pain in right shoulder: Secondary | ICD-10-CM | POA: Diagnosis not present

## 2020-09-04 DIAGNOSIS — E559 Vitamin D deficiency, unspecified: Secondary | ICD-10-CM | POA: Diagnosis not present

## 2020-09-04 DIAGNOSIS — M0579 Rheumatoid arthritis with rheumatoid factor of multiple sites without organ or systems involvement: Secondary | ICD-10-CM | POA: Diagnosis not present

## 2020-09-04 DIAGNOSIS — R5383 Other fatigue: Secondary | ICD-10-CM | POA: Diagnosis not present

## 2020-09-21 DIAGNOSIS — I110 Hypertensive heart disease with heart failure: Secondary | ICD-10-CM | POA: Diagnosis not present

## 2020-09-21 DIAGNOSIS — E559 Vitamin D deficiency, unspecified: Secondary | ICD-10-CM | POA: Diagnosis not present

## 2020-10-19 ENCOUNTER — Ambulatory Visit: Payer: Medicaid Other | Admitting: Nurse Practitioner

## 2020-10-19 ENCOUNTER — Ambulatory Visit: Payer: Medicaid Other | Admitting: Internal Medicine

## 2020-11-17 ENCOUNTER — Ambulatory Visit: Payer: Medicaid Other | Admitting: Internal Medicine

## 2020-12-15 ENCOUNTER — Ambulatory Visit: Payer: Medicaid Other | Admitting: Internal Medicine

## 2020-12-15 ENCOUNTER — Encounter: Payer: Self-pay | Admitting: Internal Medicine

## 2020-12-15 ENCOUNTER — Other Ambulatory Visit: Payer: Self-pay

## 2020-12-15 VITALS — BP 140/98 | HR 86 | Resp 18 | Ht 65.0 in | Wt 200.0 lb

## 2020-12-15 DIAGNOSIS — Z1231 Encounter for screening mammogram for malignant neoplasm of breast: Secondary | ICD-10-CM | POA: Diagnosis not present

## 2020-12-15 DIAGNOSIS — Z7689 Persons encountering health services in other specified circumstances: Secondary | ICD-10-CM

## 2020-12-15 DIAGNOSIS — I10 Essential (primary) hypertension: Secondary | ICD-10-CM | POA: Insufficient documentation

## 2020-12-15 DIAGNOSIS — Z124 Encounter for screening for malignant neoplasm of cervix: Secondary | ICD-10-CM | POA: Diagnosis not present

## 2020-12-15 DIAGNOSIS — J029 Acute pharyngitis, unspecified: Secondary | ICD-10-CM

## 2020-12-15 DIAGNOSIS — Z720 Tobacco use: Secondary | ICD-10-CM | POA: Diagnosis not present

## 2020-12-15 DIAGNOSIS — Z23 Encounter for immunization: Secondary | ICD-10-CM | POA: Diagnosis not present

## 2020-12-15 DIAGNOSIS — M069 Rheumatoid arthritis, unspecified: Secondary | ICD-10-CM | POA: Diagnosis not present

## 2020-12-15 MED ORDER — HYDROCHLOROTHIAZIDE 12.5 MG PO CAPS: 12.5000 mg | ORAL_CAPSULE | Freq: Every day | ORAL | 1 refills | Status: DC

## 2020-12-15 NOTE — Assessment & Plan Note (Signed)
Smokes about 5 cigarettes/day  Asked about quitting: confirms that she currently smokes cigarettes Advise to quit smoking: Educated about QUITTING to reduce the risk of cancer, cardio and cerebrovascular disease. Assess willingness: Unwilling to quit at this time, but is working on cutting back. Assist with counseling and pharmacotherapy: Counseled for 5 minutes and literature provided. Arrange for follow up: follow up in 3 months and continue to offer help. 

## 2020-12-15 NOTE — Assessment & Plan Note (Addendum)
Unclear etiology Advised to perform warm water gargling Advised to cut down salt intake for HTN mainly Advised to contact if she has new or worsening symptoms Use humidifier at nighttime to avoid dry air

## 2020-12-15 NOTE — Patient Instructions (Signed)
Please start taking Hydrochlorothiazide for blood pressure.  Please start following DASH diet and ambulate as tolerated.  Please continue your efforts to cut down -> quit smoking.  Please get fasting blood tests done before the next visit.

## 2020-12-15 NOTE — Assessment & Plan Note (Signed)
Care established Previous chart reviewed History and medications reviewed with the patient 

## 2020-12-15 NOTE — Assessment & Plan Note (Signed)
Followed by Rheumatology On Plaquenil and Orencia Needs to follow up with Ophthalmology 

## 2020-12-15 NOTE — Assessment & Plan Note (Signed)
BP Readings from Last 1 Encounters:  12/15/20 (!) 140/98   Uncontrolled with Amlodipine and Labetalol Had angioedema with Lisinopril Added HCTZ 12.5 mg QD Needs to decrease salt intake Counseled for compliance with the medications Advised DASH diet and moderate exercise/walking, at least 150 mins/week

## 2020-12-15 NOTE — Progress Notes (Signed)
New Patient Office Visit  Subjective:  Patient ID: Alejandra Sanders, female    DOB: 01/28/1961  Age: 60 y.o. MRN: [id number]  CC:  Chief Complaint  Patient presents with   New Patient (Initial Visit)    New patient was seeing dr Cindie Laroche has been having itchy throat for the last two days only at night     HPI Alejandra Sanders is a 60 y.o. female with PMH of HTN, RA and tobacco abuse who presents for establishing care.  HTN: Her blood pressure was elevated on multiple measurements in the office today.  She is on amlodipine and labetalol currently.  She had angioedema with Lisinopril in the past.  She denies any headache, dizziness, chest pain, dyspnea or palpitations.  She has a history of RA, for which she takes Plaquenil and Orencia.  She follows up with rheumatologist in [place].  She has not had ophthalmology visit recently since starting Plaquenil.  Denies any vision problem currently.  She complains of itchy throat for last 2 days.  She asks if it could be because of her high salt intake.  She denies any nasal congestion, fever, chills, nausea, vomiting, heartburn, dyspnea or wheezing.  She smokes about 5 cigarettes/day and has cut down from about 2.5 pack/day.  She has had 3 doses of COVID-vaccine.  She received flu vaccine in the office today.  Past Medical History:  Diagnosis Date   Arthritis    Calculus of gallbladder with chronic cholecystitis without obstruction    Chronic cholecystitis 06/11/2018   Hypertension    Seizure ([place])    had seizurea as child; unknow etiology; was on phenobarbital for a few years and then was taken off meds. No seizure in 40 years.   Thyroid disease     Past Surgical History:  Procedure Laterality Date   CHOLECYSTECTOMY N/A 06/11/2018   Procedure: LAPAROSCOPIC CHOLECYSTECTOMY;  Surgeon: Virl Cagey, MD;  Location: [facility];  Service: General;  Laterality: N/A;   THYROID SURGERY     removal of thyroid    Family History  Problem  Relation Age of Onset   Hypertension Mother    Diabetes Mother    Hypertension Father     Social History   Socioeconomic History   Marital status: Single    Spouse name: Not on file   Number of children: Not on file   Years of education: Not on file   Highest education level: Not on file  Occupational History   Not on file  Tobacco Use   Smoking status: Every Day    Packs/day: 0.25    Years: 20.00    Pack years: 5.00    Types: Cigarettes   Smokeless tobacco: Never   Tobacco comments:    states smokes 3 cig daily only  Vaping Use   Vaping Use: Never used  Substance and Sexual Activity   Alcohol use: No   Drug use: No   Sexual activity: Yes    Birth control/protection: Post-menopausal  Other Topics Concern   Not on file  Social History Narrative   Not on file   Social Determinants of Health   Financial Resource Strain: Not on file  Food Insecurity: Not on file  Transportation Needs: Not on file  Physical Activity: Not on file  Stress: Not on file  Social Connections: Not on file  Intimate Partner Violence: Not on file    ROS Review of Systems  Constitutional:  Negative for chills and fever.  HENT:  Positive  for sore throat. Negative for congestion, sinus pressure and sinus pain.   Eyes:  Negative for pain and discharge.  Respiratory:  Negative for cough and shortness of breath.   Cardiovascular:  Negative for chest pain and palpitations.  Gastrointestinal:  Negative for abdominal pain, constipation, diarrhea, nausea and vomiting.  Endocrine: Negative for polydipsia and polyuria.  Genitourinary:  Negative for dysuria and hematuria.  Musculoskeletal:  Negative for neck pain and neck stiffness.  Skin:  Negative for rash.  Neurological:  Negative for dizziness and weakness.  Psychiatric/Behavioral:  Negative for agitation and behavioral problems.    Objective:   Today's Vitals: BP (!) 140/98 (BP Location: Right Arm, Cuff Size: Normal)   Pulse 86   Resp 18    Ht $R'5\' 5"'ja$  (1.651 m)   Wt 200 lb (90.7 kg)   SpO2 97%   BMI 33.28 kg/m   Physical Exam Vitals reviewed.  Constitutional:      General: She is not in acute distress.    Appearance: She is not diaphoretic.  HENT:     Head: Normocephalic and atraumatic.     Nose: Nose normal.     Mouth/Throat:     Mouth: Mucous membranes are moist.  Eyes:     General: No scleral icterus.    Extraocular Movements: Extraocular movements intact.  Cardiovascular:     Rate and Rhythm: Normal rate and regular rhythm.     Pulses: Normal pulses.     Heart sounds: Normal heart sounds. No murmur heard. Pulmonary:     Breath sounds: Normal breath sounds. No wheezing or rales.  Abdominal:     Palpations: Abdomen is soft.     Tenderness: There is no abdominal tenderness.  Musculoskeletal:     Cervical back: Neck supple. No tenderness.     Right lower leg: No edema.     Left lower leg: No edema.  Skin:    General: Skin is warm.     Findings: No rash.  Neurological:     General: No focal deficit present.     Mental Status: She is alert and oriented to person, place, and time.  Psychiatric:        Mood and Affect: Mood normal.        Behavior: Behavior normal.    Assessment & Plan:   Problem List Items Addressed This Visit       Cardiovascular and Mediastinum   Essential hypertension    BP Readings from Last 1 Encounters:  12/15/20 (!) 140/98  Uncontrolled with Amlodipine and Labetalol Had angioedema with Lisinopril Added HCTZ 12.5 mg QD Needs to decrease salt intake Counseled for compliance with the medications Advised DASH diet and moderate exercise/walking, at least 150 mins/week       Relevant Medications   labetalol (NORMODYNE) 200 MG tablet   hydrochlorothiazide (MICROZIDE) 12.5 MG capsule   Other Relevant Orders   CBC with Differential/Platelet   CMP14+EGFR     Musculoskeletal and Integument   Rheumatoid arthritis ([place])    Followed by Rheumatology On Plaquenil and  Orencia Needs to follow up with Ophthalmology        Other   Encounter to establish care - Primary    Care established Previous chart reviewed History and medications reviewed with the patient      Relevant Orders   CBC with Differential/Platelet   CMP14+EGFR   Lipid Profile   TSH   Tobacco abuse    Smokes about 5 cigarettes/day  Asked about quitting: confirms  that she currently smokes cigarettes Advise to quit smoking: Educated about QUITTING to reduce the risk of cancer, cardio and cerebrovascular disease. Assess willingness: Unwilling to quit at this time, but is working on cutting back. Assist with counseling and pharmacotherapy: Counseled for 5 minutes and literature provided. Arrange for follow up: follow up in 3 months and continue to offer help.      Sore throat    Unclear etiology Advised to perform warm water gargling Advised to cut down salt intake for HTN mainly Advised to contact if she has new or worsening symptoms Use humidifier at nighttime to avoid dry air      Other Visit Diagnoses     Routine cervical smear       Relevant Orders   Ambulatory referral to Obstetrics / Gynecology   Screening mammogram for breast cancer       Relevant Orders   MM 3D SCREEN BREAST BILATERAL   Need for immunization against influenza       Relevant Orders   Flu Vaccine QUAD 28mo+IM (Fluarix, Fluzone & Alfiuria Quad PF) (Completed)       Outpatient Encounter Medications as of 12/15/2020  Medication Sig   amLODipine (NORVASC) 10 MG tablet Take 10 mg by mouth daily.   docusate sodium (COLACE) 100 MG capsule Take 1 capsule (100 mg total) by mouth 2 (two) times daily.   folic acid (FOLVITE) 1 MG tablet Take 1 mg by mouth daily.   hydrochlorothiazide (MICROZIDE) 12.5 MG capsule Take 1 capsule (12.5 mg total) by mouth daily.   hydroxychloroquine (PLAQUENIL) 200 MG tablet Take 2 tablets by mouth daily.   labetalol (NORMODYNE) 200 MG tablet SMARTSIG:1 Tablet(s) By Mouth  Every 12 Hours   meloxicam (MOBIC) 15 MG tablet Take 15 mg by mouth daily.   ORENCIA CLICKJECT 381 MG/ML SOAJ Inject 1 pen into the skin once a week. Takes on Tuesdays   [DISCONTINUED] lisinopril (PRINIVIL,ZESTRIL) 10 MG tablet Take 10 mg by mouth daily. (Patient not taking: Reported on 12/15/2020)   [DISCONTINUED] ondansetron (ZOFRAN-ODT) 4 MG disintegrating tablet Take 1 tablet (4 mg total) by mouth every 6 (six) hours as needed for nausea. (Patient not taking: Reported on 12/15/2020)   [DISCONTINUED] oxyCODONE (OXY IR/ROXICODONE) 5 MG immediate release tablet Take 1 tablet (5 mg total) by mouth every 4 (four) hours as needed for severe pain or breakthrough pain. (Patient not taking: Reported on 12/15/2020)   [DISCONTINUED] RASUVO 20 MG/0.4ML SOAJ Inject 1 pen into the skin once a week. Takes on Tuesdays (Patient not taking: Reported on 12/15/2020)   No facility-administered encounter medications on file as of 12/15/2020.    Follow-up: Return in about 6 weeks (around 01/26/2021) for HTN.   Lindell Spar, MD

## 2020-12-25 ENCOUNTER — Ambulatory Visit (HOSPITAL_COMMUNITY)
Admission: RE | Admit: 2020-12-25 | Discharge: 2020-12-25 | Disposition: A | Discharge status: Home / Self Care | Payer: Medicaid Other | Source: Ambulatory Visit | Attending: Internal Medicine | Admitting: Internal Medicine

## 2020-12-25 ENCOUNTER — Other Ambulatory Visit: Payer: Self-pay

## 2020-12-25 DIAGNOSIS — Z1231 Encounter for screening mammogram for malignant neoplasm of breast: Secondary | ICD-10-CM | POA: Diagnosis not present

## 2020-12-28 DIAGNOSIS — Z7689 Persons encountering health services in other specified circumstances: Secondary | ICD-10-CM | POA: Diagnosis not present

## 2020-12-28 DIAGNOSIS — I10 Essential (primary) hypertension: Secondary | ICD-10-CM | POA: Diagnosis not present

## 2020-12-28 DIAGNOSIS — M0579 Rheumatoid arthritis with rheumatoid factor of multiple sites without organ or systems involvement: Secondary | ICD-10-CM | POA: Diagnosis not present

## 2020-12-28 DIAGNOSIS — R5383 Other fatigue: Secondary | ICD-10-CM | POA: Diagnosis not present

## 2020-12-29 ENCOUNTER — Other Ambulatory Visit: Payer: Self-pay | Admitting: Internal Medicine

## 2020-12-29 DIAGNOSIS — E0590 Thyrotoxicosis, unspecified without thyrotoxic crisis or storm: Secondary | ICD-10-CM

## 2020-12-29 LAB — LIPID PANEL
Chol/HDL Ratio: 5.3 ratio — ABNORMAL HIGH (ref 0.0–4.4)
Cholesterol, Total: 175 mg/dL (ref 100–199)
HDL: 33 mg/dL — ABNORMAL LOW (ref >39)
LDL Chol Calc (NIH): 125 mg/dL — ABNORMAL HIGH (ref 0–99)
Triglycerides: 94 mg/dL (ref 0–149)
VLDL Cholesterol Cal: 17 mg/dL (ref 5–40)

## 2020-12-29 LAB — CMP14+EGFR
ALT: 12 IU/L (ref 0–32)
AST: 12 IU/L (ref 0–40)
Albumin/Globulin Ratio: 1.5 (ref 1.2–2.2)
Albumin: 4 g/dL (ref 3.8–4.9)
Alkaline Phosphatase: 148 IU/L — ABNORMAL HIGH (ref 44–121)
BUN/Creatinine Ratio: 17 (ref 9–23)
BUN: 11 mg/dL (ref 6–24)
Bilirubin Total: 0.3 mg/dL (ref 0.0–1.2)
CO2: 24 mmol/L (ref 20–29)
Calcium: 9 mg/dL (ref 8.7–10.2)
Chloride: 103 mmol/L (ref 96–106)
Creatinine, Ser: 0.66 mg/dL (ref 0.57–1.00)
Globulin, Total: 2.7 g/dL (ref 1.5–4.5)
Glucose: 98 mg/dL (ref 70–99)
Potassium: 4.1 mmol/L (ref 3.5–5.2)
Sodium: 141 mmol/L (ref 134–144)
Total Protein: 6.7 g/dL (ref 6.0–8.5)
eGFR: 101 mL/min/{1.73_m2} (ref >59)

## 2020-12-29 LAB — CBC WITH DIFFERENTIAL/PLATELET
Basophils Absolute: 0 10*3/uL (ref 0.0–0.2)
Basos: 1 %
EOS (ABSOLUTE): 0.2 10*3/uL (ref 0.0–0.4)
Eos: 4 %
Hematocrit: 40.8 % (ref 34.0–46.6)
Hemoglobin: 13.2 g/dL (ref 11.1–15.9)
Immature Grans (Abs): 0 10*3/uL (ref 0.0–0.1)
Immature Granulocytes: 0 %
Lymphocytes Absolute: 2.1 10*3/uL (ref 0.7–3.1)
Lymphs: 42 %
MCH: 25.3 pg — ABNORMAL LOW (ref 26.6–33.0)
MCHC: 32.4 g/dL (ref 31.5–35.7)
MCV: 78 fL — ABNORMAL LOW (ref 79–97)
Monocytes Absolute: 0.4 10*3/uL (ref 0.1–0.9)
Monocytes: 8 %
Neutrophils Absolute: 2.3 10*3/uL (ref 1.4–7.0)
Neutrophils: 45 %
Platelets: 266 10*3/uL (ref 150–450)
RBC: 5.21 x10E6/uL (ref 3.77–5.28)
RDW: 13 % (ref 11.7–15.4)
WBC: 4.9 10*3/uL (ref 3.4–10.8)

## 2020-12-29 LAB — TSH: TSH: 0.053 u[IU]/mL — ABNORMAL LOW (ref 0.450–4.500)

## 2021-01-06 NOTE — Patient Instructions (Signed)
Hyperthyroidism  Hyperthyroidism is when the thyroid gland is too active (overactive). The thyroid gland is a small gland located in the lower front part of the neck, just in front of the windpipe (trachea). This gland makes hormones that help control how the body uses food for energy (metabolism) as well as how the heart and brain function. These hormones also play a role in keeping your bones strong. When the thyroid is overactive, it produces toomuch of a hormone called thyroxine. What are the causes? This condition may be caused by: Graves' disease. This is a disorder in which the body's disease-fighting system (immune system) attacks the thyroid gland. This is the most common cause. Inflammation of the thyroid gland. A tumor in the thyroid gland. Use of certain medicines, including: Prescription thyroid hormone replacement. Herbal supplements that mimic thyroid hormones. Amiodarone therapy. Solid or fluid-filled lumps within your thyroid gland (thyroid nodules). Taking in a large amount of iodine from foods or medicines. What increases the risk? You are more likely to develop this condition if: You are female. You have a family history of thyroid conditions. You smoke tobacco. You use a medicine called lithium. You take medicines that affect the immune system (immunosuppressants). What are the signs or symptoms? Symptoms of this condition include: Nervousness. Inability to tolerate heat. Unexplained weight loss. Diarrhea. Change in the texture of hair or skin. Heart skipping beats or making extra beats. Rapid heart rate. Loss of menstruation. Shaky hands. Fatigue. Restlessness. Sleep problems. Enlarged thyroid gland or a lump in the thyroid (nodule). You may also have symptoms of Graves' disease, which may include: Protruding eyes. Dry eyes. Red or swollen eyes. Problems with vision. How is this diagnosed? This condition may be diagnosed based on: Your symptoms and  medical history. A physical exam. Blood tests. Thyroid ultrasound. This test involves using sound waves to produce images of the thyroid gland. A thyroid scan. A radioactive substance is injected into a vein, and images show how much iodine is present in the thyroid. Radioactive iodine uptake test (RAIU). A small amount of radioactive iodine is given by mouth to see how much iodine the thyroid absorbs after a certain amount of time. How is this treated? Treatment depends on the cause and severity of the condition. Treatment may include: Medicines to reduce the amount of thyroid hormone your body makes. Radioactive iodine treatment (radioiodine therapy). This involves swallowing a small dose of radioactive iodine, in capsule or liquid form, to kill thyroid cells. Surgery to remove part or all of your thyroid gland. You may need to take thyroid hormone replacement medicine for the rest of your life after thyroid surgery. Medicines to help manage your symptoms. Follow these instructions at home:  Take over-the-counter and prescription medicines only as told by your health care provider. Do not use any products that contain nicotine or tobacco, such as cigarettes and e-cigarettes. If you need help quitting, ask your health care provider. Follow any instructions from your health care provider about diet. You may be instructed to limit foods that contain iodine. Keep all follow-up visits as told by your health care provider. This is important. You will need to have blood tests regularly so that your health care provider can monitor your condition. Contact a health care provider if: Your symptoms do not get better with treatment. You have a fever. You are taking thyroid hormone replacement medicine and you: Have symptoms of depression. Feel like you are tired all the time. Gain weight. Get help right   away if: You have chest pain. You have decreased alertness or a change in your awareness. You  have abdominal pain. You feel dizzy. You have a rapid heartbeat. You have an irregular heartbeat. You have difficulty breathing. Summary The thyroid gland is a small gland located in the lower front part of the neck, just in front of the windpipe (trachea). Hyperthyroidism is when the thyroid gland is too active (overactive) and produces too much of a hormone called thyroxine. The most common cause is Graves' disease, a disorder in which your immune system attacks the thyroid gland. Hyperthyroidism can cause various symptoms, such as unexplained weight loss, nervousness, inability to tolerate heat, or changes in your heartbeat. Treatment may include medicine to reduce the amount of thyroid hormone your body makes, radioiodine therapy, surgery, or medicines to manage symptoms. This information is not intended to replace advice given to you by your health care provider. Make sure you discuss any questions you have with your healthcare provider. Document Revised: 12/05/2019 Document Reviewed: 12/05/2019 [employer] Patient Education  2022 [employer]  

## 2021-01-07 ENCOUNTER — Other Ambulatory Visit: Payer: Self-pay

## 2021-01-07 ENCOUNTER — Ambulatory Visit: Payer: Medicaid Other | Admitting: Nurse Practitioner

## 2021-01-07 ENCOUNTER — Encounter: Payer: Self-pay | Admitting: Nurse Practitioner

## 2021-01-07 VITALS — BP 137/79 | HR 81 | Ht 65.0 in | Wt 200.8 lb

## 2021-01-07 DIAGNOSIS — E0590 Thyrotoxicosis, unspecified without thyrotoxic crisis or storm: Secondary | ICD-10-CM | POA: Diagnosis not present

## 2021-01-07 NOTE — Progress Notes (Signed)
01/07/2021     Endocrinology Consult Note    Subjective:    Patient ID: Alejandra Sanders, female    DOB: 1960/06/04, PCP Anabel Halon, MD.   Past Medical History:  Diagnosis Date   Arthritis    Calculus of gallbladder with chronic cholecystitis without obstruction    Chronic cholecystitis 06/11/2018   Hypertension    Seizure (HCC)    had seizurea as child; unknow etiology; was on phenobarbital for a few years and then was taken off meds. No seizure in 40 years.   Thyroid disease     Past Surgical History:  Procedure Laterality Date   CHOLECYSTECTOMY N/A 06/11/2018   Procedure: LAPAROSCOPIC CHOLECYSTECTOMY;  Surgeon: Lucretia Roers, MD;  Location: [facility];  Service: General;  Laterality: N/A;   GALLBLADDER SURGERY  06/11/2018   THYROID SURGERY     removal of thyroid    Social History   Socioeconomic History   Marital status: Single    Spouse name: Not on file   Number of children: Not on file   Years of education: Not on file   Highest education level: Not on file  Occupational History   Not on file  Tobacco Use   Smoking status: Every Day    Packs/day: 0.25    Years: 20.00    Pack years: 5.00    Types: Cigarettes   Smokeless tobacco: Never   Tobacco comments:    states smokes 3 cig daily only  Vaping Use   Vaping Use: Never used  Substance and Sexual Activity   Alcohol use: No   Drug use: No   Sexual activity: Yes    Birth control/protection: Post-menopausal  Other Topics Concern   Not on file  Social History Narrative   Not on file   Social Determinants of Health   Financial Resource Strain: Not on file  Food Insecurity: Not on file  Transportation Needs: Not on file  Physical Activity: Not on file  Stress: Not on file  Social Connections: Not on file    Family History  Problem Relation Age of Onset   Hypertension Mother    Diabetes Mother    Hypertension Father     Outpatient Encounter Medications as of 01/07/2021   Medication Sig   amLODipine (NORVASC) 10 MG tablet Take 10 mg by mouth daily.   diclofenac Sodium (VOLTAREN) 1 % GEL See admin instructions.   hydrochlorothiazide (MICROZIDE) 12.5 MG capsule Take 1 capsule (12.5 mg total) by mouth daily.   hydroxychloroquine (PLAQUENIL) 200 MG tablet Take 2 tablets by mouth daily.   labetalol (NORMODYNE) 200 MG tablet SMARTSIG:1 Tablet(s) By Mouth Every 12 Hours   meloxicam (MOBIC) 15 MG tablet Take 15 mg by mouth daily.   ORENCIA CLICKJECT 125 MG/ML SOAJ Inject 1 pen into the skin once a week. Takes on Tuesdays   [DISCONTINUED] docusate sodium (COLACE) 100 MG capsule Take 1 capsule (100 mg total) by mouth 2 (two) times daily. (Patient not taking: Reported on 01/07/2021)   [DISCONTINUED] folic acid (FOLVITE) 1 MG tablet Take 1 mg by mouth daily. (Patient not taking: Reported on 01/07/2021)   No facility-administered encounter medications on file as of 01/07/2021.    ALLERGIES: No Known Allergies  VACCINATION STATUS: Immunization History  Administered Date(s) Administered   Influenza,inj,Quad PF,6+ Mos 12/15/2020     HPI  Alejandra Sanders is 60 y.o. female who presents today with a medical history as above. she is being seen in consultation for hyperthyroidism  requested by Anabel Halon, MD.  she has been dealing with symptoms of insomnia but no other obvious symptoms of hyperthyroidism.  her most recent thyroid labs revealed suppressed TSH of 0.053 on 12/28/20.  She has a history of partial thyroidectomy as a result of large goiter back in 2001.  She never needed thyroid hormone replacement after her surgery.  She has not had any additional imaging of her thyroid since her thyroidectomy.  she denies dysphagia, choking, shortness of breath, no recent voice change.    she does have family history of thyroid dysfunction in her sister and cousin (both requiring surgery 1 with goiter the other with hyperthyroidism), but denies family hx of thyroid cancer.  she  is not on any anti-thyroid medications nor on any thyroid hormone supplements. Denies use of Biotin containing supplements.  she is willing to proceed with appropriate work up and therapy for thyrotoxicosis.   Review of systems  Constitutional: + Minimally fluctuating body weight, current Body mass index is 33.41 kg/m., + fatigue, no subjective hyperthermia, no subjective hypothermia Eyes: no blurry vision, no xerophthalmia ENT: no sore throat, no nodules palpated in throat, no dysphagia/odynophagia, no hoarseness Cardiovascular: no chest pain, no shortness of breath, no palpitations, no leg swelling Respiratory: no cough, no shortness of breath Gastrointestinal: no nausea/vomiting/diarrhea Musculoskeletal: no muscle/joint aches Skin: no rashes, no hyperemia Neurological: no tremors, no numbness, no tingling, no dizziness Psychiatric: no depression, no anxiety, insomnia   Objective:    BP 137/79   Pulse 81   Ht 5\' 5"  (1.651 m)   Wt 200 lb 12.8 oz (91.1 kg)   BMI 33.41 kg/m   Wt Readings from Last 3 Encounters:  01/07/21 200 lb 12.8 oz (91.1 kg)  12/15/20 200 lb (90.7 kg)  06/11/18 191 lb 2.2 oz (86.7 kg)     BP Readings from Last 3 Encounters:  01/07/21 137/79  12/15/20 (!) 140/98  06/12/18 109/72         Physical Exam- Limited  Constitutional:  Body mass index is 33.41 kg/m. , not in acute distress, slightly anxious state of mind Eyes:  EOMI, no exophthalmos Neck: Supple, surgical scar barely visible from partial thyroidectomy Thyroid: No gross goiter Cardiovascular: RRR, no murmurs, rubs, or gallops, no edema Respiratory: Adequate breathing efforts, no crackles, rales, rhonchi, or wheezing Musculoskeletal: no gross deformities, strength intact in all four extremities, no gross restriction of joint movements Skin:  no rashes, no hyperemia Neurological: no tremor with outstretched hands   CMP     Component Value Date/Time   NA 141 12/28/2020 0813   K 4.1  12/28/2020 0813   CL 103 12/28/2020 0813   CO2 24 12/28/2020 0813   GLUCOSE 98 12/28/2020 0813   GLUCOSE 105 (H) 05/27/2018 1528   BUN 11 12/28/2020 0813   CREATININE 0.66 12/28/2020 0813   CALCIUM 9.0 12/28/2020 0813   PROT 6.7 12/28/2020 0813   ALBUMIN 4.0 12/28/2020 0813   AST 12 12/28/2020 0813   ALT 12 12/28/2020 0813   ALKPHOS 148 (H) 12/28/2020 0813   BILITOT 0.3 12/28/2020 0813   GFRNONAA >60 05/27/2018 1528   GFRAA >60 05/27/2018 1528     CBC    Component Value Date/Time   WBC 4.9 12/28/2020 0813   WBC 8.9 06/12/2018 0440   RBC 5.21 12/28/2020 0813   RBC 4.41 06/12/2018 0440   HGB 13.2 12/28/2020 0813   HCT 40.8 12/28/2020 0813   PLT 266 12/28/2020 0813   MCV 78 (L)  12/28/2020 0813   MCH 25.3 (L) 12/28/2020 0813   MCH 25.9 (L) 06/12/2018 0440   MCHC 32.4 12/28/2020 0813   MCHC 31.1 06/12/2018 0440   RDW 13.0 12/28/2020 0813   LYMPHSABS 2.1 12/28/2020 0813   EOSABS 0.2 12/28/2020 0813   BASOSABS 0.0 12/28/2020 0813     Diabetic Labs (most recent): No results found for: HGBA1C  Lipid Panel     Component Value Date/Time   CHOL 175 12/28/2020 0813   TRIG 94 12/28/2020 0813   HDL 33 (L) 12/28/2020 0813   CHOLHDL 5.3 (H) 12/28/2020 0813   LDLCALC 125 (H) 12/28/2020 0813   LABVLDL 17 12/28/2020 0813     Lab Results  Component Value Date   TSH 0.053 (L) 12/28/2020        Assessment & Plan:   1. Hyperthyroidism  she is being seen at a kind request of Anabel Halon, MD.  her history and most recent labs are reviewed, and she was examined clinically. However, more information is needed to identify the cause of her thyroid dysfunction. The potential risks of untreated thyrotoxicosis and the need for definitive therapy have been discussed in detail with her, and she agrees to proceed with diagnostic workup and treatment plan.   I will repeat full profile thyroid function tests today, including antibody testing for autoimmune thyroid dysfunction.   She already has thyroid US scheduled to be done tomorrow.  Will hold off on uptake and scan for now.   Options of therapy are discussed with her.  We discussed the option of treating it with medications including methimazole or PTU which may have side effects including rash, transaminitis, and bone marrow suppression.  We also discussed the option of definitive therapy with RAI ablation of the thyroid. If she is found to have primary hyperthyroidism from Graves' disease , toxic multinodular goiter or toxic nodular goiter the preferred modality of treatment would be I-131 thyroid ablation. Surgery is another choice of treatment in some cases.  -Patient is made aware of the high likelihood of post ablative hypothyroidism with subsequent need for lifelong thyroid hormone replacement. sheunderstands this outcome and she is  willing to proceed.      she will return in 1 week for treatment decision.   I did not initiate any new prescriptions today, HR was stable.    -Patient is advised to maintain close follow up with Anabel Halon, MD for primary care needs.   - Time spent with the patient: 60 minutes, of which >50% was spent in obtaining information about her symptoms, reviewing her previous labs, evaluations, and treatments, counseling her about her hyperthyroidism , and developing a plan to confirm the diagnosis and long term treatment as necessary. Please refer to "Patient Self Inventory" in the Media tab for reviewed elements of pertinent patient history.  Shanira Klapper participated in the discussions, expressed understanding, and voiced agreement with the above plans.  All questions were answered to her satisfaction. she is encouraged to contact clinic should she have any questions or concerns prior to her return visit.     Follow up plan: Return in about 1 week (around 01/14/2021) for Thyroid follow up, Previsit labs, thyroid ultrasound.   Thank you for involving me in the care of this  pleasant patient, and I will continue to update you with her progress.    Ronny Bacon, [facility] [address] [place], [place] [address] Phone: [phone] Fax: [phone]  01/07/2021, 10:47 AM

## 2021-01-08 ENCOUNTER — Ambulatory Visit (HOSPITAL_COMMUNITY)
Admission: RE | Admit: 2021-01-08 | Discharge: 2021-01-08 | Disposition: A | Discharge status: Home / Self Care | Payer: Medicaid Other | Source: Ambulatory Visit | Attending: Internal Medicine | Admitting: Internal Medicine

## 2021-01-08 DIAGNOSIS — E0590 Thyrotoxicosis, unspecified without thyrotoxic crisis or storm: Secondary | ICD-10-CM | POA: Diagnosis not present

## 2021-01-08 DIAGNOSIS — E042 Nontoxic multinodular goiter: Secondary | ICD-10-CM | POA: Diagnosis not present

## 2021-01-08 LAB — THYROGLOBULIN ANTIBODY: Thyroglobulin Antibody: <1 IU/mL (ref 0.0–0.9)

## 2021-01-08 LAB — TSH: TSH: 0.048 u[IU]/mL — ABNORMAL LOW (ref 0.450–4.500)

## 2021-01-08 LAB — T3, FREE: T3, Free: 3.9 pg/mL (ref 2.0–4.4)

## 2021-01-08 LAB — T4, FREE: Free T4: 1.32 ng/dL (ref 0.82–1.77)

## 2021-01-08 LAB — THYROID PEROXIDASE ANTIBODY: Thyroperoxidase Ab SerPl-aCnc: 10 IU/mL (ref 0–34)

## 2021-01-13 NOTE — Patient Instructions (Signed)
Thyroid Nodule  A thyroid nodule is an isolated growth of thyroid cells that forms a lump in your thyroid gland. The thyroid gland is a butterfly-shaped gland. It is found in the lower front of your neck. This gland sends chemical messengers (hormones) through your blood to all parts of your body. These hormones are important inregulating your body temperature and helping your body to use energy. Thyroid nodules are common. Most are not cancerous (benign). You may have one nodule or several nodules. Different types of thyroid nodules include nodules that: Grow and fill with fluid (thyroid cysts). Produce too much thyroid hormone (hot nodules or hyperthyroid). Produce no thyroid hormone (cold nodules or hypothyroid). Form from cancer cells (thyroid cancers). What are the causes? In most cases, the cause of this condition is not known. What increases the risk? The following factors may make you more likely to develop this condition. Age. Thyroid nodules become more common in people who are older than 60 years of age. Gender. Benign thyroid nodules are more common in women. Cancerous (malignant) thyroid nodules are more common in men. A family history that includes: Thyroid nodules. Pheochromocytoma. Thyroid carcinoma. Hyperparathyroidism. Certain kinds of thyroid diseases, such as Hashimoto's thyroiditis. Lack of iodine in your diet. A history of head and neck radiation, such as from previous cancer treatment. What are the signs or symptoms? In many cases, there are no symptoms. If you have symptoms, they may include: A lump in your lower neck. Feeling a lump or tickle in your throat. Pain in your neck, jaw, or ear. Having trouble swallowing. Hot nodules may cause symptoms that include: Weight loss. Warm, flushed skin. Feeling hot. Feeling nervous. A racing heartbeat. Cold nodules may cause symptoms that include: Weight gain. Dry skin. Brittle hair. This may also occur with hair  loss. Feeling cold. Fatigue. Thyroid cancer nodules may cause symptoms that include: Hard nodules that feel stuck to the thyroid gland. Hoarseness. Lumps in the glands near your thyroid (lymph nodes). How is this diagnosed? A thyroid nodule may be felt by your health care provider during a physical exam. This condition may also be diagnosed based on your symptoms. You may also have tests, including: An ultrasound. This may be done to confirm the diagnosis. A biopsy. This involves taking a sample from the nodule and looking at it under a microscope. Blood tests to make sure that your thyroid is working properly. A thyroid scan. This test uses a radioactive tracer injected into a vein to create an image of the thyroid gland on a computer screen. Imaging tests such as MRI or CT scan. These may be done if: Your nodule is large. Your nodule is blocking your airway. Cancer is suspected. How is this treated? Treatment depends on the cause and size of your nodule or nodules. If the nodule is benign, treatment may not be necessary. Your health care provider may monitor the nodule to see if it goes away without treatment. If the nodule continues to grow, is cancerous, or does not go away, treatment may be needed. Treatment may include: Having a cystic nodule drained with a needle. Ablation therapy. In this treatment, alcohol is injected into the area of the nodule to destroy the cells. Ablation with heat (thermal ablation) may also be used. Radioactive iodine. In this treatment, radioactive iodine is given as a pill or liquid that you drink. This substance causes the thyroid nodule to shrink. Surgery to remove the nodule. Part or all of your thyroid gland may   need to be removed as well. Medicines. Follow these instructions at home: Pay attention to any changes in your nodule. Take over-the-counter and prescription medicines only as told by your health care provider. Keep all follow-up visits as told  by your health care provider. This is important. Contact a health care provider if: Your voice changes. You have trouble swallowing. You have pain in your neck, ear, or jaw that is getting worse. Your nodule gets bigger. Your nodule starts to make it harder for you to breathe. Your muscles look like they are shrinking (muscle wasting). Get help right away if: You have chest pain. There is a loss of consciousness. You have a sudden fever. You feel confused. You are seeing or hearing things that other people do not see or hear (having hallucinations). You feel very weak. You have mood swings. You feel very restless. You feel suddenly nauseous or throw up. You suddenly have diarrhea. Summary A thyroid nodule is an isolated growth of thyroid cells that forms a lump in your thyroid gland. Thyroid nodules are common. Most are not cancerous (benign). You may have one nodule or several nodules. Treatment depends on the cause and size of your nodule or nodules. If the nodule is benign, treatment may not be necessary. Your health care provider may monitor the nodule to see if it goes away without treatment. If the nodule continues to grow, is cancerous, or does not go away, treatment may be needed. This information is not intended to replace advice given to you by your health care provider. Make sure you discuss any questions you have with your healthcare provider. Document Revised: 11/03/2017 Document Reviewed: 11/06/2017 Elsevier Patient Education  2022 [employer]  

## 2021-01-14 ENCOUNTER — Encounter: Payer: Self-pay | Admitting: Nurse Practitioner

## 2021-01-14 ENCOUNTER — Other Ambulatory Visit: Payer: Self-pay

## 2021-01-14 ENCOUNTER — Ambulatory Visit: Payer: Medicaid Other | Admitting: Nurse Practitioner

## 2021-01-14 VITALS — BP 143/85 | HR 91 | Ht 65.0 in | Wt 200.6 lb

## 2021-01-14 DIAGNOSIS — E042 Nontoxic multinodular goiter: Secondary | ICD-10-CM | POA: Diagnosis not present

## 2021-01-14 DIAGNOSIS — E0590 Thyrotoxicosis, unspecified without thyrotoxic crisis or storm: Secondary | ICD-10-CM | POA: Diagnosis not present

## 2021-01-14 DIAGNOSIS — E890 Postprocedural hypothyroidism: Secondary | ICD-10-CM | POA: Diagnosis not present

## 2021-01-14 NOTE — Progress Notes (Signed)
01/14/2021     Endocrinology Follow Up Note    Subjective:    Patient ID: Alejandra Sanders, female    DOB: 12/16/1960, PCP Anabel Halon, MD.   Past Medical History:  Diagnosis Date   Arthritis    Calculus of gallbladder with chronic cholecystitis without obstruction    Chronic cholecystitis 06/11/2018   Hypertension    Seizure ([facility])    had seizurea as child; unknow etiology; was on phenobarbital for a few years and then was taken off meds. No seizure in 40 years.   Thyroid disease     Past Surgical History:  Procedure Laterality Date   CHOLECYSTECTOMY N/A 06/11/2018   Procedure: LAPAROSCOPIC CHOLECYSTECTOMY;  Surgeon: Lucretia Roers, MD;  Location: [facility];  Service: General;  Laterality: N/A;   GALLBLADDER SURGERY  06/11/2018   THYROID SURGERY     removal of thyroid    Social History   Socioeconomic History   Marital status: Single    Spouse name: Not on file   Number of children: Not on file   Years of education: Not on file   Highest education level: Not on file  Occupational History   Not on file  Tobacco Use   Smoking status: Every Day    Packs/day: 0.25    Years: 20.00    Pack years: 5.00    Types: Cigarettes   Smokeless tobacco: Never   Tobacco comments:    states smokes 3 cig daily only  Vaping Use   Vaping Use: Never used  Substance and Sexual Activity   Alcohol use: No   Drug use: No   Sexual activity: Yes    Birth control/protection: Post-menopausal  Other Topics Concern   Not on file  Social History Narrative   Not on file   Social Determinants of Health   Financial Resource Strain: Not on file  Food Insecurity: Not on file  Transportation Needs: Not on file  Physical Activity: Not on file  Stress: Not on file  Social Connections: Not on file    Family History  Problem Relation Age of Onset   Hypertension Mother    Diabetes Mother    Hypertension Father     Outpatient Encounter Medications as of 01/14/2021   Medication Sig   amLODipine (NORVASC) 10 MG tablet Take 10 mg by mouth daily.   diclofenac Sodium (VOLTAREN) 1 % GEL See admin instructions.   hydrochlorothiazide (MICROZIDE) 12.5 MG capsule Take 1 capsule (12.5 mg total) by mouth daily.   hydroxychloroquine (PLAQUENIL) 200 MG tablet Take 2 tablets by mouth daily.   labetalol (NORMODYNE) 200 MG tablet SMARTSIG:1 Tablet(s) By Mouth Every 12 Hours   meloxicam (MOBIC) 15 MG tablet Take 15 mg by mouth daily.   ORENCIA CLICKJECT 125 MG/ML SOAJ Inject 1 pen into the skin once a week. Takes on Tuesdays   No facility-administered encounter medications on file as of 01/14/2021.    ALLERGIES: No Known Allergies  VACCINATION STATUS: Immunization History  Administered Date(s) Administered   Influenza,inj,Quad PF,6+ Mos 12/15/2020     HPI  Alejandra Sanders is 60 y.o. female who presents today with a medical history as above. she is being seen in follow up after being seen in consultation for hyperthyroidism requested by Anabel Halon, MD.  she has been dealing with symptoms of insomnia but no other obvious symptoms of hyperthyroidism.  her most recent thyroid labs revealed suppressed TSH of 0.053 on 12/28/20.  She has a history of  partial thyroidectomy as a result of large goiter back in 2001.  She never needed thyroid hormone replacement after her surgery.    she denies dysphagia, choking, shortness of breath, no recent voice change.    she does have family history of thyroid dysfunction in her sister and cousin (both requiring surgery 1 with goiter the other with hyperthyroidism), but denies family hx of thyroid cancer.  she is not on any anti-thyroid medications nor on any thyroid hormone supplements. Denies use of Biotin containing supplements.  she is willing to proceed with appropriate work up and therapy for thyrotoxicosis.   Review of systems  Constitutional: + Minimally fluctuating body weight, current Body mass index is 33.38 kg/m., +  fatigue, no subjective hyperthermia, no subjective hypothermia Eyes: no blurry vision, no xerophthalmia ENT: no sore throat, no nodules palpated in throat, no dysphagia/odynophagia, no hoarseness Cardiovascular: no chest pain, no shortness of breath, no palpitations, no leg swelling Respiratory: no cough, no shortness of breath Gastrointestinal: no nausea/vomiting/diarrhea Musculoskeletal: no muscle/joint aches Skin: no rashes, no hyperemia Neurological: no tremors, no numbness, no tingling, no dizziness Psychiatric: no depression, no anxiety, insomnia   Objective:    BP (!) 143/85   Pulse 91   Ht 5\' 5"  (1.651 m)   Wt 200 lb 9.6 oz (91 kg)   BMI 33.38 kg/m   Wt Readings from Last 3 Encounters:  01/14/21 200 lb 9.6 oz (91 kg)  01/07/21 200 lb 12.8 oz (91.1 kg)  12/15/20 200 lb (90.7 kg)     BP Readings from Last 3 Encounters:  01/14/21 (!) 143/85  01/07/21 137/79  12/15/20 (!) 140/98         Physical Exam- Limited  Constitutional:  Body mass index is 33.38 kg/m. , not in acute distress, slightly anxious state of mind Eyes:  EOMI, no exophthalmos Neck: Supple, surgical scar barely visible from partial thyroidectomy Thyroid: No gross goiter Cardiovascular: RRR, no murmurs, rubs, or gallops, no edema Respiratory: Adequate breathing efforts, no crackles, rales, rhonchi, or wheezing Musculoskeletal: no gross deformities, strength intact in all four extremities, no gross restriction of joint movements Skin:  no rashes, no hyperemia Neurological: no tremor with outstretched hands   CMP     Component Value Date/Time   NA 141 12/28/2020 0813   K 4.1 12/28/2020 0813   CL 103 12/28/2020 0813   CO2 24 12/28/2020 0813   GLUCOSE 98 12/28/2020 0813   GLUCOSE 105 (H) 05/27/2018 1528   BUN 11 12/28/2020 0813   CREATININE 0.66 12/28/2020 0813   CALCIUM 9.0 12/28/2020 0813   PROT 6.7 12/28/2020 0813   ALBUMIN 4.0 12/28/2020 0813   AST 12 12/28/2020 0813   ALT 12 12/28/2020  0813   ALKPHOS 148 (H) 12/28/2020 0813   BILITOT 0.3 12/28/2020 0813   GFRNONAA >60 05/27/2018 1528   GFRAA >60 05/27/2018 1528     CBC    Component Value Date/Time   WBC 4.9 12/28/2020 0813   WBC 8.9 06/12/2018 0440   RBC 5.21 12/28/2020 0813   RBC 4.41 06/12/2018 0440   HGB 13.2 12/28/2020 0813   HCT 40.8 12/28/2020 0813   PLT 266 12/28/2020 0813   MCV 78 (L) 12/28/2020 0813   MCH 25.3 (L) 12/28/2020 0813   MCH 25.9 (L) 06/12/2018 0440   MCHC 32.4 12/28/2020 0813   MCHC 31.1 06/12/2018 0440   RDW 13.0 12/28/2020 0813   LYMPHSABS 2.1 12/28/2020 0813   EOSABS 0.2 12/28/2020 0813   BASOSABS 0.0 12/28/2020  0813     Diabetic Labs (most recent): No results found for: HGBA1C  Lipid Panel     Component Value Date/Time   CHOL 175 12/28/2020 0813   TRIG 94 12/28/2020 0813   HDL 33 (L) 12/28/2020 0813   CHOLHDL 5.3 (H) 12/28/2020 0813   LDLCALC 125 (H) 12/28/2020 0813   LABVLDL 17 12/28/2020 0813     Lab Results  Component Value Date   TSH 0.048 (L) 01/07/2021   TSH 0.053 (L) 12/28/2020   FREET4 1.32 01/07/2021     Results for NEVENA, ROZENBERG (MRN [id number]) as of 01/14/2021 10:00  Ref. Range 12/28/2020 08:13 01/07/2021 10:53  TSH Latest Ref Range: 0.450 - 4.500 uIU/mL 0.053 (L) 0.048 (L)  Triiodothyronine,Free,Serum Latest Ref Range: 2.0 - 4.4 pg/mL  3.9  T4,Free(Direct) Latest Ref Range: 0.82 - 1.77 ng/dL  6.22  Thyroperoxidase Ab SerPl-aCnc Latest Ref Range: 0 - 34 IU/mL  10  Thyroglobulin Antibody Latest Ref Range: 0.0 - 0.9 IU/mL  <1.0   US Thyroid 01/08/21 CLINICAL DATA:  60 year old female with a history hyperthyroidism   EXAM: THYROID ULTRASOUND   TECHNIQUE: Ultrasound examination of the thyroid gland and adjacent soft tissues was performed.   COMPARISON:  None.   FINDINGS: Parenchymal Echotexture: Markedly heterogenous   Isthmus: 0.8 cm   Right lobe: 4.8 cm x 2.4 cm x 2.5 cm   Left lobe: 5.9 cm x 3.3 cm x 4.4 cm    _________________________________________________________   Estimated total number of nodules >/= 1 cm: 6-10   Number of spongiform nodules >/=  2 cm not described below (TR1): 0   Number of mixed cystic and solid nodules >/= 1.5 cm not described below (TR2): 0   _________________________________________________________   Nodule # 1:   Location: Isthmus; mid   Maximum size: 1.3 cm; Other 2 dimensions: 0.9 cm x 0.7 cm   Composition: cannot determine (2)   Echogenicity: isoechoic (1)   Shape: not taller-than-wide (0)   Margins: ill-defined (0)   Echogenic foci: none (0)   ACR TI-RADS total points: 3.   ACR TI-RADS risk category: TR3 (3 points).   ACR TI-RADS recommendations:   Nodule does not meet criteria for surveillance or biopsy   _________________________________________________________   Nodule # 2:   Location: Right; Superior   Maximum size: 3.0 cm; Other 2 dimensions: 2.1 cm x 2.0 cm   Composition: solid/almost completely solid (2)   Echogenicity: isoechoic (1)   Shape: not taller-than-wide (0)   Margins: ill-defined (0)   Echogenic foci: none (0)   ACR TI-RADS total points: 3.   ACR TI-RADS risk category: TR3 (3 points).   ACR TI-RADS recommendations:   Nodule meets criteria for biopsy   _________________________________________________________   Nodule # 3:   Location: Right; Mid   Maximum size: 2.0 cm; Other 2 dimensions: 1.9 cm x 1.5 cm   Composition: cannot determine (2)   Echogenicity: isoechoic (1)   Shape: not taller-than-wide (0)   Margins: ill-defined (0)   Echogenic foci: none (0)   ACR TI-RADS total points: 3.   ACR TI-RADS risk category: TR3 (3 points).   ACR TI-RADS recommendations:   Nodule meets criteria for surveillance   _________________________________________________________   Nodule # 4:   Location: Left; Superior   Maximum size: 1.7 cm; Other 2 dimensions: 1.7 cm x 1.5 cm   Composition:  solid/almost completely solid (2)   Echogenicity: isoechoic (1)   Shape: not taller-than-wide (0)   Margins: smooth (0)  Echogenic foci: none (0)   ACR TI-RADS total points: 3.   ACR TI-RADS risk category: TR3 (3 points).   ACR TI-RADS recommendations:   Nodule meets criteria for surveillance   _________________________________________________________   Nodule # 5:   Location: Left; Mid   Maximum size: 2.3 cm; Other 2 dimensions: 2.0 cm x 1.8 cm   Composition: solid/almost completely solid (2)   Echogenicity: hyperechoic (1)   Shape: not taller-than-wide (0)   Margins: ill-defined (0)   Echogenic foci: none (0)   ACR TI-RADS total points: 3.   ACR TI-RADS risk category: TR3 (3 points).   ACR TI-RADS recommendations:   Nodule meets criteria for surveillance   _________________________________________________________   Nodule # 6:   Location: Left; Mid   Maximum size: 1.8 cm; Other 2 dimensions: 1.5 cm x 1.4 cm   Composition: mixed cystic and solid (1)   Echogenicity: isoechoic (1)   Shape: not taller-than-wide (0)   Margins: ill-defined (0)   Echogenic foci: none (0)   ACR TI-RADS total points: 2.   ACR TI-RADS risk category: TR2 (2 points).   ACR TI-RADS recommendations:   Cystic nodule does not meet criteria for surveillance or biopsy   _________________________________________________________   Nodule # 7:   Location: Left; Inferior   Maximum size: 2.4 cm; Other 2 dimensions: 2.4 cm x 2.1 cm   Composition: solid/almost completely solid (2)   Echogenicity: isoechoic (1)   Shape: not taller-than-wide (0)   Margins: ill-defined (0)   Echogenic foci: none (0)   ACR TI-RADS total points: 3.   ACR TI-RADS risk category: TR3 (3 points).   ACR TI-RADS recommendations:   Nodule meets criteria for surveillance   _________________________________________________________   No adenopathy   IMPRESSION: Multinodular thyroid.    Right superior thyroid nodule (labeled 2, 3.0 cm, TR 3) meets criteria for biopsy, as designated by the newly established ACR TI-RADS criteria, and referral for biopsy is recommended.   Right inferior thyroid nodule (labeled 3), left superior thyroid nodules (labeled 4 and labeled 5), and left inferior thyroid nodule (labeled 7) all meet criteria for surveillance, as designated by the newly established ACR TI-RADS criteria. Surveillance ultrasound study recommended to be performed annually up to 5 years.   Recommendations follow those established by the new ACR TI-RADS criteria (J Am Coll Radiol 2017;14:587-595).     Electronically Signed   By: Gilmer Mor D.O.   On: 01/08/2021 14:12  Assessment & Plan:   1. Hyperthyroidism  she is being seen at a kind request of Anabel Halon, MD.  her history and most recent labs are reviewed, and she was examined clinically. However, more information is needed to identify the cause of her thyroid dysfunction. Her thyroid ultrasound was consistent with multinodular goiter one of which recommended FNA biopsy to rule out malignancy.  However, will hold off on this and rule out potential toxic nodule as cause of her suppressed TSH first with uptake and scan.  If her uptake and scan shows cold nodule, will proceed with FNA a that time.   Her repeat thyroid function tests still show suppressed TSH with normal FT4 and FT3 values.  Her antibody testing was negative, ruling out autoimmune thyroid dysfunction such as Graves disease.   Options of therapy are discussed with her.  We discussed the option of treating it with medications including methimazole or PTU which may have side effects including rash, transaminitis, and bone marrow suppression.  We also discussed the option of  definitive therapy with RAI ablation of the thyroid. If she is found to have toxic multinodular goiter or toxic nodular goiter the preferred modality of treatment would be I-131  thyroid ablation. Surgery is another choice of treatment in some cases.  -Patient is made aware of the high likelihood of post ablative hypothyroidism with subsequent need for lifelong thyroid hormone replacement. sheunderstands this outcome and she is  willing to proceed.      she will return in 2 weeks for treatment decision.   I did not initiate any new prescriptions today, HR was stable.    -Patient is advised to maintain close follow up with Anabel Halon, MD for primary care needs.    I spent 25 minutes in the care of the patient today including review of labs from Thyroid Function, CMP, and other relevant labs ; imaging/biopsy records (current and previous including abstractions from other facilities); face-to-face time discussing  her lab results and symptoms, medications doses, her options of short and long term treatment based on the latest standards of care / guidelines;   and documenting the encounter.  Gwendelyn Porras  participated in the discussions, expressed understanding, and voiced agreement with the above plans.  All questions were answered to her satisfaction. she is encouraged to contact clinic should she have any questions or concerns prior to her return visit.    Follow up plan: Return in about 2 weeks (around 01/28/2021) for Thyroid follow up- uptake/scan.   Thank you for involving me in the care of this pleasant patient, and I will continue to update you with her progress.   Ronny Bacon, [facility] [address] [place], [place] [address] Phone: [phone] Fax: [phone]  01/14/2021, 10:17 AM

## 2021-01-25 ENCOUNTER — Encounter (HOSPITAL_COMMUNITY)
Admission: RE | Admit: 2021-01-25 | Discharge: 2021-01-25 | Disposition: A | Discharge status: Home / Self Care | Payer: Medicaid Other | Source: Ambulatory Visit | Attending: Nurse Practitioner | Admitting: Nurse Practitioner

## 2021-01-25 ENCOUNTER — Other Ambulatory Visit: Payer: Self-pay

## 2021-01-25 DIAGNOSIS — E042 Nontoxic multinodular goiter: Secondary | ICD-10-CM | POA: Diagnosis not present

## 2021-01-25 DIAGNOSIS — E890 Postprocedural hypothyroidism: Secondary | ICD-10-CM | POA: Diagnosis not present

## 2021-01-25 DIAGNOSIS — E0590 Thyrotoxicosis, unspecified without thyrotoxic crisis or storm: Secondary | ICD-10-CM | POA: Diagnosis not present

## 2021-01-25 MED ORDER — SODIUM IODIDE I-123 7.4 MBQ CAPS
315.0000 | ORAL_CAPSULE | Freq: Once | ORAL | Status: AC
  Administered 2021-01-25: 315 via ORAL

## 2021-01-26 ENCOUNTER — Ambulatory Visit: Payer: Medicaid Other | Admitting: Internal Medicine

## 2021-01-26 ENCOUNTER — Encounter (HOSPITAL_COMMUNITY)
Admission: RE | Admit: 2021-01-26 | Discharge: 2021-01-26 | Disposition: A | Discharge status: Home / Self Care | Payer: Medicaid Other | Source: Ambulatory Visit | Attending: Nurse Practitioner | Admitting: Nurse Practitioner

## 2021-01-26 ENCOUNTER — Encounter: Payer: Self-pay | Admitting: Internal Medicine

## 2021-01-26 VITALS — BP 132/84 | HR 90 | Temp 98.2°F | Resp 18 | Ht 65.0 in | Wt 202.0 lb

## 2021-01-26 DIAGNOSIS — I10 Essential (primary) hypertension: Secondary | ICD-10-CM

## 2021-01-26 DIAGNOSIS — R5383 Other fatigue: Secondary | ICD-10-CM | POA: Insufficient documentation

## 2021-01-26 DIAGNOSIS — M1991 Primary osteoarthritis, unspecified site: Secondary | ICD-10-CM | POA: Insufficient documentation

## 2021-01-26 DIAGNOSIS — G629 Polyneuropathy, unspecified: Secondary | ICD-10-CM | POA: Insufficient documentation

## 2021-01-26 DIAGNOSIS — E669 Obesity, unspecified: Secondary | ICD-10-CM | POA: Insufficient documentation

## 2021-01-26 DIAGNOSIS — M25511 Pain in right shoulder: Secondary | ICD-10-CM | POA: Diagnosis not present

## 2021-01-26 DIAGNOSIS — E039 Hypothyroidism, unspecified: Secondary | ICD-10-CM | POA: Insufficient documentation

## 2021-01-26 DIAGNOSIS — E042 Nontoxic multinodular goiter: Secondary | ICD-10-CM | POA: Diagnosis not present

## 2021-01-26 DIAGNOSIS — Z79899 Other long term (current) drug therapy: Secondary | ICD-10-CM | POA: Insufficient documentation

## 2021-01-26 DIAGNOSIS — E0510 Thyrotoxicosis with toxic single thyroid nodule without thyrotoxic crisis or storm: Secondary | ICD-10-CM | POA: Insufficient documentation

## 2021-01-26 MED ORDER — CYCLOBENZAPRINE HCL 10 MG PO TABS: 10.0000 mg | ORAL_TABLET | Freq: Three times a day (TID) | ORAL | 0 refills | Status: DC | PRN

## 2021-01-26 MED ORDER — HYDROCHLOROTHIAZIDE 12.5 MG PO CAPS: 12.5000 mg | ORAL_CAPSULE | Freq: Every day | ORAL | 1 refills | Status: DC

## 2021-01-26 NOTE — Assessment & Plan Note (Signed)
BP Readings from Last 1 Encounters:  01/26/21 132/84   Well-controlled with Amlodipine, HCTZ and Labetalol Counseled for compliance with the medications Advised DASH diet and moderate exercise/walking, at least 150 mins/week

## 2021-01-26 NOTE — Assessment & Plan Note (Signed)
Likely muscle strain due to heavy lifting Continue Mobic Flexeril as needed for muscle spasms avoid heavy lifting Continue shoulder movement as tolerated If persistent, will refer to orthopedic surgeon

## 2021-01-26 NOTE — Patient Instructions (Signed)
Please take Flexeril for right shoulder stiffness.  Continue to take other medications as prescribed.  Continue to follow low salt diet and ambulate as tolerated.

## 2021-01-26 NOTE — Progress Notes (Signed)
Established Patient Office Visit  Subjective:  Patient ID: Alejandra Sanders, female    DOB: 10-01-1960  Age: 60 y.o. MRN: [id number]  CC:  Chief Complaint  Patient presents with   Follow-up    6 week follow up shoulder is stiff due to arthritis     HPI Alejandra Sanders presents for follow-up of HTN and complains of right shoulder pain.  HTN: Her BP is well controlled today.  She has started taking HCTZ in addition to amlodipine and labetalol.  She denies any headache, dizziness, chest pain, dyspnea or palpitations.  She has been trying to follow low-salt diet now.  She complains of right shoulder pain, more on anterior side after she lifted a case of water yesterday.  Her pain is constant, worse with movement and better with Mobic.  She denies any numbness or weakness of the right UE.  Past Medical History:  Diagnosis Date   Arthritis    Calculus of gallbladder with chronic cholecystitis without obstruction    Chronic cholecystitis 06/11/2018   Hypertension    Seizure ([facility])    had seizurea as child; unknow etiology; was on phenobarbital for a few years and then was taken off meds. No seizure in 40 years.   Thyroid disease     Past Surgical History:  Procedure Laterality Date   CHOLECYSTECTOMY N/A 06/11/2018   Procedure: LAPAROSCOPIC CHOLECYSTECTOMY;  Surgeon: Virl Cagey, MD;  Location: [facility];  Service: General;  Laterality: N/A;   GALLBLADDER SURGERY  06/11/2018   THYROID SURGERY     removal of thyroid    Family History  Problem Relation Age of Onset   Hypertension Mother    Diabetes Mother    Hypertension Father     Social History   Socioeconomic History   Marital status: Single    Spouse name: Not on file   Number of children: Not on file   Years of education: Not on file   Highest education level: Not on file  Occupational History   Not on file  Tobacco Use   Smoking status: Every Day    Packs/day: 0.25    Years: 20.00    Pack years: 5.00    Types:  Cigarettes   Smokeless tobacco: Never   Tobacco comments:    states smokes 3 cig daily only  Vaping Use   Vaping Use: Never used  Substance and Sexual Activity   Alcohol use: No   Drug use: No   Sexual activity: Yes    Birth control/protection: Post-menopausal  Other Topics Concern   Not on file  Social History Narrative   Not on file   Social Determinants of Health   Financial Resource Strain: Not on file  Food Insecurity: Not on file  Transportation Needs: Not on file  Physical Activity: Not on file  Stress: Not on file  Social Connections: Not on file  Intimate Partner Violence: Not on file    Outpatient Medications Prior to Visit  Medication Sig Dispense Refill   amLODipine (NORVASC) 10 MG tablet Take 10 mg by mouth daily.     diclofenac Sodium (VOLTAREN) 1 % GEL See admin instructions.     hydroxychloroquine (PLAQUENIL) 200 MG tablet Take 2 tablets by mouth daily.     labetalol (NORMODYNE) 200 MG tablet SMARTSIG:1 Tablet(s) By Mouth Every 12 Hours     meloxicam (MOBIC) 15 MG tablet Take 15 mg by mouth daily.     ORENCIA CLICKJECT 680 MG/ML SOAJ Inject 1 pen into  the skin once a week. Takes on Tuesdays     hydrochlorothiazide (MICROZIDE) 12.5 MG capsule Take 1 capsule (12.5 mg total) by mouth daily. 30 capsule 1   No facility-administered medications prior to visit.    Allergies  Allergen Reactions   Lisinopril Swelling    ROS Review of Systems  Constitutional:  Negative for chills and fever.  HENT:  Negative for congestion, sinus pressure and sinus pain.   Eyes:  Negative for pain and discharge.  Respiratory:  Negative for cough and shortness of breath.   Cardiovascular:  Negative for chest pain and palpitations.  Gastrointestinal:  Negative for abdominal pain, constipation, diarrhea, nausea and vomiting.  Endocrine: Negative for polydipsia and polyuria.  Genitourinary:  Negative for dysuria and hematuria.  Musculoskeletal:  Positive for arthralgias.  Negative for neck pain and neck stiffness.  Skin:  Negative for rash.  Neurological:  Negative for dizziness and weakness.  Psychiatric/Behavioral:  Negative for agitation and behavioral problems.      Objective:    Physical Exam Vitals reviewed.  Constitutional:      General: She is not in acute distress.    Appearance: She is not diaphoretic.  HENT:     Head: Normocephalic and atraumatic.     Nose: Nose normal. No congestion.     Mouth/Throat:     Mouth: Mucous membranes are moist.     Pharynx: No posterior oropharyngeal erythema.  Eyes:     General: No scleral icterus.    Extraocular Movements: Extraocular movements intact.  Cardiovascular:     Rate and Rhythm: Normal rate and regular rhythm.     Pulses: Normal pulses.     Heart sounds: Normal heart sounds. No murmur heard. Pulmonary:     Breath sounds: Normal breath sounds. No wheezing or rales.  Musculoskeletal:     Cervical back: Neck supple. No tenderness.     Right lower leg: No edema.     Left lower leg: No edema.     Comments: R shoulder ROM limited due to pain, no point tenderness  Skin:    General: Skin is warm.     Findings: No rash.  Neurological:     General: No focal deficit present.     Mental Status: She is alert and oriented to person, place, and time.  Psychiatric:        Mood and Affect: Mood normal.        Behavior: Behavior normal.    BP 132/84 (BP Location: Left Arm, Patient Position: Sitting, Cuff Size: Normal)   Pulse 90   Temp 98.2 F (36.8 C) (Oral)   Resp 18   Ht _0  (1.651 m)   Wt 202 lb 0.6 oz (91.6 kg)   SpO2 95%   BMI 33.62 kg/m  Wt Readings from Last 3 Encounters:  01/26/21 202 lb 0.6 oz (91.6 kg)  01/14/21 200 lb 9.6 oz (91 kg)  01/07/21 200 lb 12.8 oz (91.1 kg)     Health Maintenance Due  Topic Date Due   Pneumococcal Vaccine 81-53 Years old (1 - PCV) Never done   HIV Screening  Never done   Hepatitis C Screening  Never done   TETANUS/TDAP  Never done   Zoster  Vaccines- Shingrix (1 of 2) Never done   PAP SMEAR-Modifier  Never done   COLONOSCOPY (Pts 45-28yr Insurance coverage will need to be confirmed)  Never done   COVID-19 Vaccine (2 - Moderna risk series) 03/21/2020    There are  no preventive care reminders to display for this patient.  Lab Results  Component Value Date   TSH 0.048 (L) 01/07/2021   Lab Results  Component Value Date   WBC 4.9 12/28/2020   HGB 13.2 12/28/2020   HCT 40.8 12/28/2020   MCV 78 (L) 12/28/2020   PLT 266 12/28/2020   Lab Results  Component Value Date   NA 141 12/28/2020   K 4.1 12/28/2020   CO2 24 12/28/2020   GLUCOSE 98 12/28/2020   BUN 11 12/28/2020   CREATININE 0.66 12/28/2020   BILITOT 0.3 12/28/2020   ALKPHOS 148 (H) 12/28/2020   AST 12 12/28/2020   ALT 12 12/28/2020   PROT 6.7 12/28/2020   ALBUMIN 4.0 12/28/2020   CALCIUM 9.0 12/28/2020   ANIONGAP 9 05/27/2018   EGFR 101 12/28/2020   Lab Results  Component Value Date   CHOL 175 12/28/2020   Lab Results  Component Value Date   HDL 33 (L) 12/28/2020   Lab Results  Component Value Date   LDLCALC 125 (H) 12/28/2020   Lab Results  Component Value Date   TRIG 94 12/28/2020   Lab Results  Component Value Date   CHOLHDL 5.3 (H) 12/28/2020   No results found for: HGBA1C    Assessment & Plan:   Problem List Items Addressed This Visit       Cardiovascular and Mediastinum   Essential hypertension    BP Readings from Last 1 Encounters:  01/26/21 132/84  Well-controlled with Amlodipine, HCTZ and Labetalol Counseled for compliance with the medications Advised DASH diet and moderate exercise/walking, at least 150 mins/week      Relevant Medications   hydrochlorothiazide (MICROZIDE) 12.5 MG capsule     Other   Right anterior shoulder pain - Primary    Likely muscle strain due to heavy lifting Continue Mobic Flexeril as needed for muscle spasms avoid heavy lifting Continue shoulder movement as tolerated If persistent,  will refer to orthopedic surgeon      Relevant Medications   cyclobenzaprine (FLEXERIL) 10 MG tablet    Meds ordered this encounter  Medications   cyclobenzaprine (FLEXERIL) 10 MG tablet    Sig: Take 1 tablet (10 mg total) by mouth 3 (three) times daily as needed for muscle spasms.    Dispense:  30 tablet    Refill:  0   hydrochlorothiazide (MICROZIDE) 12.5 MG capsule    Sig: Take 1 capsule (12.5 mg total) by mouth daily.    Dispense:  90 capsule    Refill:  1    Follow-up: Return in about 6 months (around 07/27/2021) for HTN and hyperthyroidism.    Lindell Spar, MD

## 2021-02-01 ENCOUNTER — Encounter: Payer: Self-pay | Admitting: Nurse Practitioner

## 2021-02-01 ENCOUNTER — Ambulatory Visit: Payer: Medicaid Other | Admitting: Nurse Practitioner

## 2021-02-01 ENCOUNTER — Other Ambulatory Visit: Payer: Self-pay

## 2021-02-01 VITALS — BP 145/82 | HR 89 | Ht 65.0 in | Wt 202.0 lb

## 2021-02-01 DIAGNOSIS — E890 Postprocedural hypothyroidism: Secondary | ICD-10-CM

## 2021-02-01 DIAGNOSIS — E042 Nontoxic multinodular goiter: Secondary | ICD-10-CM

## 2021-02-01 DIAGNOSIS — E0510 Thyrotoxicosis with toxic single thyroid nodule without thyrotoxic crisis or storm: Secondary | ICD-10-CM | POA: Diagnosis not present

## 2021-02-01 DIAGNOSIS — E0590 Thyrotoxicosis, unspecified without thyrotoxic crisis or storm: Secondary | ICD-10-CM

## 2021-02-01 NOTE — Progress Notes (Signed)
02/01/2021     Endocrinology Follow Up Note    Subjective:    Patient ID: Alejandra Sanders, female    DOB: 07-07-1960, PCP Anabel Halon, MD.   Past Medical History:  Diagnosis Date   Arthritis    Calculus of gallbladder with chronic cholecystitis without obstruction    Chronic cholecystitis 06/11/2018   Hypertension    Seizure (HCC)    had seizurea as child; unknow etiology; was on phenobarbital for a few years and then was taken off meds. No seizure in 40 years.   Thyroid disease     Past Surgical History:  Procedure Laterality Date   CHOLECYSTECTOMY N/A 06/11/2018   Procedure: LAPAROSCOPIC CHOLECYSTECTOMY;  Surgeon: Lucretia Roers, MD;  Location: [facility];  Service: General;  Laterality: N/A;   GALLBLADDER SURGERY  06/11/2018   THYROID SURGERY     removal of thyroid    Social History   Socioeconomic History   Marital status: Single    Spouse name: Not on file   Number of children: Not on file   Years of education: Not on file   Highest education level: Not on file  Occupational History   Not on file  Tobacco Use   Smoking status: Every Day    Packs/day: 0.25    Years: 20.00    Pack years: 5.00    Types: Cigarettes   Smokeless tobacco: Never   Tobacco comments:    states smokes 3 cig daily only  Vaping Use   Vaping Use: Never used  Substance and Sexual Activity   Alcohol use: No   Drug use: No   Sexual activity: Yes    Birth control/protection: Post-menopausal  Other Topics Concern   Not on file  Social History Narrative   Not on file   Social Determinants of Health   Financial Resource Strain: Not on file  Food Insecurity: Not on file  Transportation Needs: Not on file  Physical Activity: Not on file  Stress: Not on file  Social Connections: Not on file    Family History  Problem Relation Age of Onset   Hypertension Mother    Diabetes Mother    Hypertension Father     Outpatient Encounter Medications as of 02/01/2021   Medication Sig   amLODipine (NORVASC) 10 MG tablet Take 10 mg by mouth daily.   cyclobenzaprine (FLEXERIL) 10 MG tablet Take 1 tablet (10 mg total) by mouth 3 (three) times daily as needed for muscle spasms.   diclofenac Sodium (VOLTAREN) 1 % GEL See admin instructions.   hydrochlorothiazide (MICROZIDE) 12.5 MG capsule Take 1 capsule (12.5 mg total) by mouth daily.   hydroxychloroquine (PLAQUENIL) 200 MG tablet Take 2 tablets by mouth daily.   labetalol (NORMODYNE) 200 MG tablet SMARTSIG:1 Tablet(s) By Mouth Every 12 Hours   meloxicam (MOBIC) 15 MG tablet Take 15 mg by mouth daily.   ORENCIA CLICKJECT 125 MG/ML SOAJ Inject 1 pen into the skin once a week. Takes on Tuesdays   No facility-administered encounter medications on file as of 02/01/2021.    ALLERGIES: Allergies  Allergen Reactions   Lisinopril Swelling    VACCINATION STATUS: Immunization History  Administered Date(s) Administered   Influenza,inj,Quad PF,6+ Mos 12/15/2020   Moderna SARS-COV2 Booster Vaccination 06/14/2019, 02/22/2020   Moderna Sars-Covid-2 Vaccination 05/16/2019     HPI  Alejandra Sanders is 60 y.o. female who presents today with a medical history as above. she is being seen in follow up after being seen  in consultation for hyperthyroidism requested by Anabel Halon, MD.  she has been dealing with symptoms of insomnia but no other obvious symptoms of hyperthyroidism.  her most recent thyroid labs revealed suppressed TSH of 0.053 on 12/28/20.  She has a history of partial thyroidectomy as a result of large goiter back in 2001.  She never needed thyroid hormone replacement after her surgery.    she denies dysphagia, choking, shortness of breath, no recent voice change.    she does have family history of thyroid dysfunction in her sister and cousin (both requiring surgery 1 with goiter the other with hyperthyroidism), but denies family hx of thyroid cancer.  she is not on any anti-thyroid medications nor on any  thyroid hormone supplements. Denies use of Biotin containing supplements.  she is willing to proceed with appropriate work up and therapy for thyrotoxicosis.   Review of systems  Constitutional: + Minimally fluctuating body weight, current Body mass index is 33.61 kg/m., + fatigue, no subjective hyperthermia, no subjective hypothermia Eyes: no blurry vision, no xerophthalmia ENT: no sore throat, no nodules palpated in throat, no dysphagia/odynophagia, no hoarseness Cardiovascular: no chest pain, no shortness of breath, no palpitations, no leg swelling Respiratory: no cough, no shortness of breath Gastrointestinal: no nausea/vomiting/diarrhea Musculoskeletal: no muscle/joint aches Skin: no rashes, no hyperemia Neurological: no tremors, no numbness, no tingling, no dizziness Psychiatric: no depression, no anxiety, insomnia   Objective:    BP (!) 145/82   Pulse 89   Ht  (1.651 m)   Wt 202 lb (91.6 kg)   BMI 33.61 kg/m   Wt Readings from Last 3 Encounters:  02/01/21 202 lb (91.6 kg)  01/26/21 202 lb 0.6 oz (91.6 kg)  01/14/21 200 lb 9.6 oz (91 kg)     BP Readings from Last 3 Encounters:  02/01/21 (!) 145/82  01/26/21 132/84  01/14/21 (!) 143/85         Physical Exam- Limited  Constitutional:  Body mass index is 33.61 kg/m. , not in acute distress, slightly anxious state of mind Eyes:  EOMI, no exophthalmos Neck: Supple, surgical scar barely visible from partial thyroidectomy Thyroid: No gross goiter Cardiovascular: RRR, no murmurs, rubs, or gallops, no edema Respiratory: Adequate breathing efforts, no crackles, rales, rhonchi, or wheezing Musculoskeletal: no gross deformities, strength intact in all four extremities, no gross restriction of joint movements Skin:  no rashes, no hyperemia Neurological: no tremor with outstretched hands   CMP     Component Value Date/Time   NA 141 12/28/2020 0813   K 4.1 12/28/2020 0813   CL 103 12/28/2020 0813   CO2 24  12/28/2020 0813   GLUCOSE 98 12/28/2020 0813   GLUCOSE 105 (H) 05/27/2018 1528   BUN 11 12/28/2020 0813   CREATININE 0.66 12/28/2020 0813   CALCIUM 9.0 12/28/2020 0813   PROT 6.7 12/28/2020 0813   ALBUMIN 4.0 12/28/2020 0813   AST 12 12/28/2020 0813   ALT 12 12/28/2020 0813   ALKPHOS 148 (H) 12/28/2020 0813   BILITOT 0.3 12/28/2020 0813   GFRNONAA >60 05/27/2018 1528   GFRAA >60 05/27/2018 1528     CBC    Component Value Date/Time   WBC 4.9 12/28/2020 0813   WBC 8.9 06/12/2018 0440   RBC 5.21 12/28/2020 0813   RBC 4.41 06/12/2018 0440   HGB 13.2 12/28/2020 0813   HCT 40.8 12/28/2020 0813   PLT 266 12/28/2020 0813   MCV 78 (L) 12/28/2020 0813   MCH 25.3 (L) 12/28/2020  0813   MCH 25.9 (L) 06/12/2018 0440   MCHC 32.4 12/28/2020 0813   MCHC 31.1 06/12/2018 0440   RDW 13.0 12/28/2020 0813   LYMPHSABS 2.1 12/28/2020 0813   EOSABS 0.2 12/28/2020 0813   BASOSABS 0.0 12/28/2020 0813     Diabetic Labs (most recent): No results found for: HGBA1C  Lipid Panel     Component Value Date/Time   CHOL 175 12/28/2020 0813   TRIG 94 12/28/2020 0813   HDL 33 (L) 12/28/2020 0813   CHOLHDL 5.3 (H) 12/28/2020 0813   LDLCALC 125 (H) 12/28/2020 0813   LABVLDL 17 12/28/2020 0813     Lab Results  Component Value Date   TSH 0.048 (L) 01/07/2021   TSH 0.053 (L) 12/28/2020   FREET4 1.32 01/07/2021     Results for Alejandra, Sanders (MRN [id number]) as of 01/14/2021 10:00  Ref. Range 12/28/2020 08:13 01/07/2021 10:53  TSH Latest Ref Range: 0.450 - 4.500 uIU/mL 0.053 (L) 0.048 (L)  Triiodothyronine,Free,Serum Latest Ref Range: 2.0 - 4.4 pg/mL  3.9  T4,Free(Direct) Latest Ref Range: 0.82 - 1.77 ng/dL  0.99  Thyroperoxidase Ab SerPl-aCnc Latest Ref Range: 0 - 34 IU/mL  10  Thyroglobulin Antibody Latest Ref Range: 0.0 - 0.9 IU/mL  <1.0   US Thyroid 01/08/21 CLINICAL DATA:  60 year old female with a history hyperthyroidism   EXAM: THYROID ULTRASOUND   TECHNIQUE: Ultrasound examination  of the thyroid gland and adjacent soft tissues was performed.   COMPARISON:  None.   FINDINGS: Parenchymal Echotexture: Markedly heterogenous   Isthmus: 0.8 cm   Right lobe: 4.8 cm x 2.4 cm x 2.5 cm   Left lobe: 5.9 cm x 3.3 cm x 4.4 cm   _________________________________________________________   Estimated total number of nodules >/= 1 cm: 6-10   Number of spongiform nodules >/=  2 cm not described below (TR1): 0   Number of mixed cystic and solid nodules >/= 1.5 cm not described below (TR2): 0   _________________________________________________________   Nodule # 1:   Location: Isthmus; mid   Maximum size: 1.3 cm; Other 2 dimensions: 0.9 cm x 0.7 cm   Composition: cannot determine (2)   Echogenicity: isoechoic (1)   Shape: not taller-than-wide (0)   Margins: ill-defined (0)   Echogenic foci: none (0)   ACR TI-RADS total points: 3.   ACR TI-RADS risk category: TR3 (3 points).   ACR TI-RADS recommendations:   Nodule does not meet criteria for surveillance or biopsy   _________________________________________________________   Nodule # 2:   Location: Right; Superior   Maximum size: 3.0 cm; Other 2 dimensions: 2.1 cm x 2.0 cm   Composition: solid/almost completely solid (2)   Echogenicity: isoechoic (1)   Shape: not taller-than-wide (0)   Margins: ill-defined (0)   Echogenic foci: none (0)   ACR TI-RADS total points: 3.   ACR TI-RADS risk category: TR3 (3 points).   ACR TI-RADS recommendations:   Nodule meets criteria for biopsy   _________________________________________________________   Nodule # 3:   Location: Right; Mid   Maximum size: 2.0 cm; Other 2 dimensions: 1.9 cm x 1.5 cm   Composition: cannot determine (2)   Echogenicity: isoechoic (1)   Shape: not taller-than-wide (0)   Margins: ill-defined (0)   Echogenic foci: none (0)   ACR TI-RADS total points: 3.   ACR TI-RADS risk category: TR3 (3 points).   ACR TI-RADS  recommendations:   Nodule meets criteria for surveillance   _________________________________________________________   Nodule # 4:  Location: Left; Superior   Maximum size: 1.7 cm; Other 2 dimensions: 1.7 cm x 1.5 cm   Composition: solid/almost completely solid (2)   Echogenicity: isoechoic (1)   Shape: not taller-than-wide (0)   Margins: smooth (0)   Echogenic foci: none (0)   ACR TI-RADS total points: 3.   ACR TI-RADS risk category: TR3 (3 points).   ACR TI-RADS recommendations:   Nodule meets criteria for surveillance   _________________________________________________________   Nodule # 5:   Location: Left; Mid   Maximum size: 2.3 cm; Other 2 dimensions: 2.0 cm x 1.8 cm   Composition: solid/almost completely solid (2)   Echogenicity: hyperechoic (1)   Shape: not taller-than-wide (0)   Margins: ill-defined (0)   Echogenic foci: none (0)   ACR TI-RADS total points: 3.   ACR TI-RADS risk category: TR3 (3 points).   ACR TI-RADS recommendations:   Nodule meets criteria for surveillance   _________________________________________________________   Nodule # 6:   Location: Left; Mid   Maximum size: 1.8 cm; Other 2 dimensions: 1.5 cm x 1.4 cm   Composition: mixed cystic and solid (1)   Echogenicity: isoechoic (1)   Shape: not taller-than-wide (0)   Margins: ill-defined (0)   Echogenic foci: none (0)   ACR TI-RADS total points: 2.   ACR TI-RADS risk category: TR2 (2 points).   ACR TI-RADS recommendations:   Cystic nodule does not meet criteria for surveillance or biopsy   _________________________________________________________   Nodule # 7:   Location: Left; Inferior   Maximum size: 2.4 cm; Other 2 dimensions: 2.4 cm x 2.1 cm   Composition: solid/almost completely solid (2)   Echogenicity: isoechoic (1)   Shape: not taller-than-wide (0)   Margins: ill-defined (0)   Echogenic foci: none (0)   ACR TI-RADS total points:  3.   ACR TI-RADS risk category: TR3 (3 points).   ACR TI-RADS recommendations:   Nodule meets criteria for surveillance   _________________________________________________________   No adenopathy   IMPRESSION: Multinodular thyroid.   Right superior thyroid nodule (labeled 2, 3.0 cm, TR 3) meets criteria for biopsy, as designated by the newly established ACR TI-RADS criteria, and referral for biopsy is recommended.   Right inferior thyroid nodule (labeled 3), left superior thyroid nodules (labeled 4 and labeled 5), and left inferior thyroid nodule (labeled 7) all meet criteria for surveillance, as designated by the newly established ACR TI-RADS criteria. Surveillance ultrasound study recommended to be performed annually up to 5 years.   Recommendations follow those established by the new ACR TI-RADS criteria (J Am Coll Radiol 2017;14:587-595).     Electronically Signed   By: Gilmer Mor D.O.   On: 01/08/2021 14:12 --------------------------------------------------------------------------------------------------------------  Uptake and scan 01/26/21 CLINICAL DATA:  Suppressed TSH, prior partial thyroidectomy, multinodular goiter question toxic nodule, hyperthyroidism   EXAM: THYROID SCAN AND UPTAKE - 4 AND 24 HOURS   TECHNIQUE: Following oral administration of I-123 capsule, anterior planar imaging was acquired at 24 hours. Thyroid uptake was calculated with a thyroid probe at 4-6 hours and 24 hours.   RADIOPHARMACEUTICALS:  315 uCi I-123 sodium iodide p.o.   COMPARISON:  Thyroid ultrasound 01/08/2021   FINDINGS: Focal hot nodule in the inferior pole of LEFT thyroid lobe consistent with toxic adenoma.   This corresponds to a 2.5 cm diameter nodule on ultrasound at the inferior pole.   Suppression of uptake in remaining thyroid lobes.   No additional warm nodules.   Subtle areas of decreased tracer uptake, may represent  suppression or subtle nodules.   4  hour I-123 uptake = 6.3 % (normal 5-20%)   24 hour I-123 uptake = 14.8% (normal 10-30%)   IMPRESSION: Normal 4 hour and 24 hour radio iodine uptakes.   Hot nodule inferior pole LEFT thyroid lobe corresponding to a 2.5 cm diameter nodule seen on prior ultrasound consistent with toxic adenoma.   Suppression of uptake in remaining thyroid tissue.     Electronically Signed   By: Ulyses Southward M.D.   On: 01/27/2021 16:01 Assessment & Plan:   1. Hyperthyroidism r/t toxic thyroid nodule  she is being seen at a kind request of Anabel Halon, MD.  Her recent uptake and scan showed hot nodule in left inferior pole, consistent with toxic thyroid nodule. I recommended treatment with RAI ablation and she agrees.  Will recheck TFTs 6 weeks after therapy to monitor thyroid function.  There is a possibility that she may not require thyroid hormone replacement therapy in the future.     -Patient is advised to maintain close follow up with Anabel Halon, MD for primary care needs.     I spent 20 minutes in the care of the patient today including review of labs from Thyroid Function, CMP, and other relevant labs ; imaging/biopsy records (current and previous including abstractions from other facilities); face-to-face time discussing  her lab results and symptoms, medications doses, her options of short and long term treatment based on the latest standards of care / guidelines;   and documenting the encounter.  Alejandra Sanders  participated in the discussions, expressed understanding, and voiced agreement with the above plans.  All questions were answered to her satisfaction. she is encouraged to contact clinic should she have any questions or concerns prior to her return visit.    Follow up plan: Return in about 8 weeks (around 03/29/2021) for Thyroid follow up- RAI ablation, Previsit labs.   Thank you for involving me in the care of this pleasant patient, and I will continue to update you  with her progress.   Ronny Bacon, [facility] [address] [place], [place] [address] Phone: [phone] Fax: [phone]  02/01/2021, 10:28 AM

## 2021-02-03 NOTE — Written Directive (Addendum)
MOLECULAR IMAGING AND THERAPEUTICS WRITTEN DIRECTIVE   PATIENT NAME: Alejandra Sanders  PT DOB:   1960-09-24                                              MRN: [id number]  ---------------------------------------------------------------------------------------------------------------------   I-131 WHOLE THYROID THERAPY (NON-CANCER)    RADIOPHARMACEUTICAL:   Iodine-131 Capsule    PRESCRIBED DOSE FOR ADMINISTRATION:  40 mCi  (forty millicuries IV)   ROUTE OFADMINISTRATION: PO   DIAGNOSIS:  Toxic Thyroid Nodule   REFERRING PHYSICIAN: Whitney Reardon/Dr. Fransico Him   TSH:    Lab Results  Component Value Date   TSH 0.048 (L) 01/07/2021   TSH 0.053 (L) 12/28/2020     PRIOR I-131 THERAPY (Date and Dose):  None   PRIOR RADIOLOGY EXAMS (Results and Date): NM THYROID MULT UPTAKE W/IMAGING  Result Date: 01/27/2021 CLINICAL DATA:  Suppressed TSH, prior partial thyroidectomy, multinodular goiter question toxic nodule, hyperthyroidism EXAM: THYROID SCAN AND UPTAKE - 4 AND 24 HOURS TECHNIQUE: Following oral administration of I-123 capsule, anterior planar imaging was acquired at 24 hours. Thyroid uptake was calculated with a thyroid probe at 4-6 hours and 24 hours. RADIOPHARMACEUTICALS:  315 uCi I-123 sodium iodide p.o. COMPARISON:  Thyroid ultrasound 01/08/2021 FINDINGS: Focal hot nodule in the inferior pole of LEFT thyroid lobe consistent with toxic adenoma. This corresponds to a 2.5 cm diameter nodule on ultrasound at the inferior pole. Suppression of uptake in remaining thyroid lobes. No additional warm nodules. Subtle areas of decreased tracer uptake, may represent suppression or subtle nodules. 4 hour I-123 uptake = 6.3 % (normal 5-20%) 24 hour I-123 uptake = 14.8% (normal 10-30%) IMPRESSION: Normal 4 hour and 24 hour radio iodine uptakes. Hot nodule inferior pole LEFT thyroid lobe corresponding to a 2.5 cm diameter nodule seen on prior ultrasound consistent with toxic adenoma. Suppression of  uptake in remaining thyroid tissue. Electronically Signed   By: Ulyses Southward M.D.   On: 01/27/2021 16:01   US THYROID  Result Date: 01/08/2021 CLINICAL DATA:  60 year old female with a history hyperthyroidism EXAM: THYROID ULTRASOUND TECHNIQUE: Ultrasound examination of the thyroid gland and adjacent soft tissues was performed. COMPARISON:  None. FINDINGS: Parenchymal Echotexture: Markedly heterogenous Isthmus: 0.8 cm Right lobe: 4.8 cm x 2.4 cm x 2.5 cm Left lobe: 5.9 cm x 3.3 cm x 4.4 cm _________________________________________________________ Estimated total number of nodules >/= 1 cm: 6-10 Number of spongiform nodules >/=  2 cm not described below (TR1): 0 Number of mixed cystic and solid nodules >/= 1.5 cm not described below (TR2): 0 _________________________________________________________ Nodule # 1: Location: Isthmus; mid Maximum size: 1.3 cm; Other 2 dimensions: 0.9 cm x 0.7 cm Composition: cannot determine (2) Echogenicity: isoechoic (1) Shape: not taller-than-wide (0) Margins: ill-defined (0) Echogenic foci: none (0) ACR TI-RADS total points: 3. ACR TI-RADS risk category: TR3 (3 points). ACR TI-RADS recommendations: Nodule does not meet criteria for surveillance or biopsy _________________________________________________________ Nodule # 2: Location: Right; Superior Maximum size: 3.0 cm; Other 2 dimensions: 2.1 cm x 2.0 cm Composition: solid/almost completely solid (2) Echogenicity: isoechoic (1) Shape: not taller-than-wide (0) Margins: ill-defined (0) Echogenic foci: none (0) ACR TI-RADS total points: 3. ACR TI-RADS risk category: TR3 (3 points). ACR TI-RADS recommendations: Nodule meets criteria for biopsy _________________________________________________________ Nodule # 3: Location: Right; Mid Maximum size: 2.0 cm; Other 2 dimensions: 1.9 cm x 1.5 cm Composition: cannot  determine (2) Echogenicity: isoechoic (1) Shape: not taller-than-wide (0) Margins: ill-defined (0) Echogenic foci: none (0) ACR  TI-RADS total points: 3. ACR TI-RADS risk category: TR3 (3 points). ACR TI-RADS recommendations: Nodule meets criteria for surveillance _________________________________________________________ Nodule # 4: Location: Left; Superior Maximum size: 1.7 cm; Other 2 dimensions: 1.7 cm x 1.5 cm Composition: solid/almost completely solid (2) Echogenicity: isoechoic (1) Shape: not taller-than-wide (0) Margins: smooth (0) Echogenic foci: none (0) ACR TI-RADS total points: 3. ACR TI-RADS risk category: TR3 (3 points). ACR TI-RADS recommendations: Nodule meets criteria for surveillance _________________________________________________________ Nodule # 5: Location: Left; Mid Maximum size: 2.3 cm; Other 2 dimensions: 2.0 cm x 1.8 cm Composition: solid/almost completely solid (2) Echogenicity: hyperechoic (1) Shape: not taller-than-wide (0) Margins: ill-defined (0) Echogenic foci: none (0) ACR TI-RADS total points: 3. ACR TI-RADS risk category: TR3 (3 points). ACR TI-RADS recommendations: Nodule meets criteria for surveillance _________________________________________________________ Nodule # 6: Location: Left; Mid Maximum size: 1.8 cm; Other 2 dimensions: 1.5 cm x 1.4 cm Composition: mixed cystic and solid (1) Echogenicity: isoechoic (1) Shape: not taller-than-wide (0) Margins: ill-defined (0) Echogenic foci: none (0) ACR TI-RADS total points: 2. ACR TI-RADS risk category: TR2 (2 points). ACR TI-RADS recommendations: Cystic nodule does not meet criteria for surveillance or biopsy _________________________________________________________ Nodule # 7: Location: Left; Inferior Maximum size: 2.4 cm; Other 2 dimensions: 2.4 cm x 2.1 cm Composition: solid/almost completely solid (2) Echogenicity: isoechoic (1) Shape: not taller-than-wide (0) Margins: ill-defined (0) Echogenic foci: none (0) ACR TI-RADS total points: 3. ACR TI-RADS risk category: TR3 (3 points). ACR TI-RADS recommendations: Nodule meets criteria for surveillance  _________________________________________________________ No adenopathy IMPRESSION: Multinodular thyroid. Right superior thyroid nodule (labeled 2, 3.0 cm, TR 3) meets criteria for biopsy, as designated by the newly established ACR TI-RADS criteria, and referral for biopsy is recommended. Right inferior thyroid nodule (labeled 3), left superior thyroid nodules (labeled 4 and labeled 5), and left inferior thyroid nodule (labeled 7) all meet criteria for surveillance, as designated by the newly established ACR TI-RADS criteria. Surveillance ultrasound study recommended to be performed annually up to 5 years. Recommendations follow those established by the new ACR TI-RADS criteria (J Am Coll Radiol 2017;14:587-595). Electronically Signed   By: Gilmer Mor D.O.   On: 01/08/2021 14:12      ADDITIONAL PHYSICIAN COMMENTS/NOTES   AUTHORIZED USER SIGNATURE & TIME STAMP:  Florean Hoobler A. Tyron Russell, M.D.   02/03/2021 @ 1011 hrs

## 2021-02-08 ENCOUNTER — Encounter: Payer: Self-pay | Admitting: Adult Health

## 2021-02-08 ENCOUNTER — Ambulatory Visit: Payer: Medicaid Other | Admitting: Adult Health

## 2021-02-08 ENCOUNTER — Other Ambulatory Visit: Payer: Self-pay

## 2021-02-08 ENCOUNTER — Other Ambulatory Visit (HOSPITAL_COMMUNITY)
Admission: RE | Admit: 2021-02-08 | Discharge: 2021-02-08 | Disposition: A | Discharge status: Home / Self Care | Payer: Medicaid Other | Source: Ambulatory Visit | Attending: Adult Health | Admitting: Adult Health

## 2021-02-08 VITALS — BP 131/85 | HR 107 | Ht 64.5 in | Wt 202.0 lb

## 2021-02-08 DIAGNOSIS — Z1211 Encounter for screening for malignant neoplasm of colon: Secondary | ICD-10-CM

## 2021-02-08 DIAGNOSIS — Z0000 Encounter for general adult medical examination without abnormal findings: Secondary | ICD-10-CM

## 2021-02-08 LAB — HEMOCCULT GUIAC POC 1CARD (OFFICE): Fecal Occult Blood, POC: NEGATIVE

## 2021-02-08 NOTE — Progress Notes (Signed)
Patient ID: Alejandra Sanders, female   DOB: 1960-06-22, 60 y.o.   MRN: [id number] History of Present Illness: Alejandra Sanders is a 59 year old black female, single, PM in for pelvic and pap. Had physical with PCP. She moved here from [place] about 6 years ago, was seeing Dr Janna Arch, not Dr Allena Katz. PCP is Dr Allena Katz.   Current Medications, Allergies, Past Medical History, Past Surgical History, Family History and Social History were reviewed in [employer] record.     Review of Systems: Patient denies any headaches, hearing loss, fatigue, blurred vision, shortness of breath, chest pain, abdominal pain, problems with bowel movements, urination, or intercourse.(No sex in over 6 years). No joint pain or mood swings.  She denies any vaginal bleeding.   Physical Exam:BP 131/85 (BP Location: Left Arm, Patient Position: Sitting, Cuff Size: Large)   Pulse (!) 107   Ht 5' 4.5" (1.638 m)   Wt 202 lb (91.6 kg)   BMI 34.14 kg/m   General:  Well developed, well nourished, no acute distress Skin:  Warm and dry Lungs; Clear to auscultation bilaterally Cardiovascular: Regular rate and rhythm Pelvic:  External genitalia is normal in appearance, no lesions.  The vagina is pale pink. Urethra has no lesions or masses. The cervix is smooth, pap with HR HPV genotyping performed. Uterus is felt to be normal size, shape, and contour.  No adnexal masses or tenderness noted.Bladder is non tender, no masses felt. Rectal: Good sphincter tone, no polyps, or hemorrhoids felt.  Hemoccult negative. Extremities/musculoskeletal:  No swelling or varicosities noted, no clubbing or cyanosis Psych:  No mood changes, alert and cooperative,seems happy AA [place] 1 Fall risk is low Depression screen [facility] 2/9 02/08/2021 01/26/2021 12/15/2020  Decreased Interest 0 0 0  Down, Depressed, Hopeless 0 0 0  PHQ - 2 Score 0 0 0  Altered sleeping 3 - -  Tired, decreased energy 2 - -  Change in appetite 0 - -  Feeling bad or failure  about yourself  0 - -  Trouble concentrating 0 - -  Moving slowly or fidgety/restless 0 - -  Suicidal thoughts 0 - -  PHQ-9 Score 5 - -    GAD 7 : Generalized Anxiety Score 02/08/2021  Nervous, Anxious, on Edge 0  Control/stop worrying 0  Worry too much - different things 0  Trouble relaxing 0  Restless 0  Easily annoyed or irritable 0  Afraid - awful might happen 0  Total GAD 7 Score 0       Upstream - 02/08/21 1133       Pregnancy Intention Screening   Does the patient want to become pregnant in the next year? No    Does the patient's partner want to become pregnant in the next year? No    Would the patient like to discuss contraceptive options today? No      Contraception Wrap Up   Current Method No Method - Other Reason   postmenopausal   End Method No Method - Other Reason   postmenopausal   Contraception Counseling Provided No           .Examination chaperoned by Malachy Mood LPN   Impression and Plan: 1. Routine general medical examination at a health care facility Pap sent Physical with PCP Pap in 3 years if normal Labs with PCP Mammogram yearly Had colonoscopy in [place]   2. Encounter for screening fecal occult blood testing

## 2021-02-10 LAB — CYTOLOGY - PAP
Adequacy: ABSENT
Comment: NEGATIVE
Diagnosis: NEGATIVE
High risk HPV: NEGATIVE

## 2021-02-11 ENCOUNTER — Telehealth: Payer: Self-pay | Admitting: Adult Health

## 2021-02-11 ENCOUNTER — Ambulatory Visit (HOSPITAL_COMMUNITY)
Admission: RE | Admit: 2021-02-11 | Discharge: 2021-02-11 | Disposition: A | Discharge status: Home / Self Care | Payer: Medicaid Other | Source: Ambulatory Visit | Attending: Nurse Practitioner | Admitting: Nurse Practitioner

## 2021-02-11 ENCOUNTER — Encounter: Payer: Self-pay | Admitting: Adult Health

## 2021-02-11 ENCOUNTER — Encounter (HOSPITAL_COMMUNITY): Payer: Self-pay

## 2021-02-11 ENCOUNTER — Other Ambulatory Visit: Payer: Self-pay

## 2021-02-11 DIAGNOSIS — E0510 Thyrotoxicosis with toxic single thyroid nodule without thyrotoxic crisis or storm: Secondary | ICD-10-CM | POA: Insufficient documentation

## 2021-02-11 DIAGNOSIS — A599 Trichomoniasis, unspecified: Secondary | ICD-10-CM

## 2021-02-11 DIAGNOSIS — E0590 Thyrotoxicosis, unspecified without thyrotoxic crisis or storm: Secondary | ICD-10-CM | POA: Diagnosis not present

## 2021-02-11 HISTORY — DX: Trichomoniasis, unspecified: A59.9

## 2021-02-11 MED ORDER — METRONIDAZOLE 500 MG PO TABS: 500.0000 mg | ORAL_TABLET | Freq: Two times a day (BID) | ORAL | 0 refills | Status: DC

## 2021-02-11 MED ORDER — SODIUM IODIDE I 131 CAPSULE
40.0000 | Freq: Once | INTRAVENOUS | Status: AC | PRN
  Administered 2021-02-11: 39.5 via ORAL

## 2021-02-11 NOTE — Telephone Encounter (Signed)
Pt aware +trich on pap will rx flagyl;, has not had sex in 6 years. No alcohol

## 2021-02-17 ENCOUNTER — Emergency Department (HOSPITAL_COMMUNITY)
Admission: EM | Admit: 2021-02-17 | Discharge: 2021-02-17 | Disposition: A | Discharge status: Home / Self Care | Payer: Medicaid Other | Attending: Emergency Medicine | Admitting: Emergency Medicine

## 2021-02-17 ENCOUNTER — Emergency Department (HOSPITAL_COMMUNITY): Payer: Medicaid Other

## 2021-02-17 ENCOUNTER — Other Ambulatory Visit: Payer: Self-pay

## 2021-02-17 ENCOUNTER — Encounter (HOSPITAL_COMMUNITY): Payer: Self-pay

## 2021-02-17 DIAGNOSIS — Z79899 Other long term (current) drug therapy: Secondary | ICD-10-CM | POA: Insufficient documentation

## 2021-02-17 DIAGNOSIS — S39012A Strain of muscle, fascia and tendon of lower back, initial encounter: Secondary | ICD-10-CM | POA: Diagnosis not present

## 2021-02-17 DIAGNOSIS — Y92813 Airplane as the place of occurrence of the external cause: Secondary | ICD-10-CM | POA: Insufficient documentation

## 2021-02-17 DIAGNOSIS — F17210 Nicotine dependence, cigarettes, uncomplicated: Secondary | ICD-10-CM | POA: Insufficient documentation

## 2021-02-17 DIAGNOSIS — S3992XA Unspecified injury of lower back, initial encounter: Secondary | ICD-10-CM | POA: Diagnosis present

## 2021-02-17 DIAGNOSIS — I10 Essential (primary) hypertension: Secondary | ICD-10-CM | POA: Insufficient documentation

## 2021-02-17 DIAGNOSIS — E039 Hypothyroidism, unspecified: Secondary | ICD-10-CM | POA: Diagnosis not present

## 2021-02-17 DIAGNOSIS — M545 Low back pain, unspecified: Secondary | ICD-10-CM | POA: Diagnosis not present

## 2021-02-17 DIAGNOSIS — W108XXA Fall (on) (from) other stairs and steps, initial encounter: Secondary | ICD-10-CM | POA: Insufficient documentation

## 2021-02-17 MED ORDER — LIDOCAINE 5 % EX PTCH: 1.0000 | MEDICATED_PATCH | CUTANEOUS | 0 refills | Status: DC

## 2021-02-17 MED ORDER — LIDOCAINE 5 % EX PTCH
1.0000 | MEDICATED_PATCH | CUTANEOUS | Status: DC
  Administered 2021-02-17: 1 via TRANSDERMAL
  Filled 2021-02-17: qty 1

## 2021-02-17 MED ORDER — HYDROCODONE-ACETAMINOPHEN 5-325 MG PO TABS
1.0000 | ORAL_TABLET | Freq: Once | ORAL | Status: AC
  Administered 2021-02-17: 1 via ORAL
  Filled 2021-02-17: qty 1

## 2021-02-17 NOTE — Discharge Instructions (Addendum)
Follow-up with your doctor as discussed.  You may benefit from physical therapy if pain continues.  Apply Lidoderm patch as needed as prescribed for pain.  You may continue with your muscle relaxant and Tylenol at home.  If you need additional pain relief, recommend applying Voltaren gel.

## 2021-02-17 NOTE — ED Notes (Signed)
Patient transported to X-ray 

## 2021-02-17 NOTE — ED Provider Notes (Signed)
[facility] EMERGENCY DEPARTMENT Provider Note   CSN: [id number] Arrival date & time: 02/17/21  1626     History Chief Complaint  Patient presents with   Back Pain    Alejandra Sanders is a 60 y.o. female.  60 year old female presents with complaint of low back pain after fall down steps today.  Patient states that she was at the top of a flight of stairs when she slipped and the approximately 13-15 wooden steps on her buttocks and back.  Denies hitting her head or loss of consciousness.  States that when she got to the bottom of the stairs she jumped back up and noticed that she had pain in her low back.  Patient has been ambulatory since the fall without difficulty.  Denies neck pain, upper or lower extremity injury.  No other complaints or concerns.  Is not anticoagulated.  Patient took Tylenol and a muscle relaxer prior to coming to the emergency room, feels like this has not been adequately treated her pain.      Past Medical History:  Diagnosis Date   Arthritis    Calculus of gallbladder with chronic cholecystitis without obstruction    Chronic cholecystitis 06/11/2018   Hypertension    Seizure (HCC)    had seizurea as child; unknow etiology; was on phenobarbital for a few years and then was taken off meds. No seizure in 40 years.   Thyroid disease    Trichimoniasis 02/11/2021   Treated with flagyl 02/11/21    Patient Active Problem List   Diagnosis Date Noted   Trichimoniasis 02/11/2021   Obesity 01/26/2021   Fatigue 01/26/2021   Hypothyroidism 01/26/2021   Neuropathy 01/26/2021   Other long term (current) drug therapy 01/26/2021   Primary osteoarthritis 01/26/2021   Right anterior shoulder pain 01/26/2021   Essential hypertension 12/15/2020   Rheumatoid arthritis (HCC) 12/15/2020   Tobacco abuse 12/15/2020   Sore throat 12/15/2020    Past Surgical History:  Procedure Laterality Date   CHOLECYSTECTOMY N/A 06/11/2018   Procedure: LAPAROSCOPIC CHOLECYSTECTOMY;   Surgeon: Lucretia Roers, MD;  Location: [facility];  Service: General;  Laterality: N/A;   GALLBLADDER SURGERY  06/11/2018   THYROID SURGERY     removal of thyroid     OB History     Gravida  2   Para  2   Term      Preterm  2   AB      Living  2      SAB      IAB      Ectopic      Multiple      Live Births  2           Family History  Problem Relation Age of Onset   Aneurysm Maternal Grandmother    Hypertension Father    Hypertension Mother    Diabetes Mother    Heart attack Brother    Hypertension Sister    Kidney failure Sister    Hypertension Sister    Hypertension Sister    Hypertension Son     Social History   Tobacco Use   Smoking status: Every Day    Packs/day: 0.25    Years: 35.00    Pack years: 8.75    Types: Cigarettes   Smokeless tobacco: Never   Tobacco comments:    states smokes 3 cig daily only  Vaping Use   Vaping Use: Never used  Substance Use Topics   Alcohol use:  No   Drug use: No    Home Medications Prior to Admission medications   Medication Sig Start Date End Date Taking? Authorizing Provider  lidocaine (LIDODERM) 5 % Place 1 patch onto the skin daily. Remove & Discard patch within 12 hours or as directed by MD 02/17/21  Yes Jeannie Fend, PA-C  amLODipine (NORVASC) 10 MG tablet Take 10 mg by mouth daily. 05/11/18   [provider]  cyclobenzaprine (FLEXERIL) 10 MG tablet Take 1 tablet (10 mg total) by mouth 3 (three) times daily as needed for muscle spasms. 01/26/21   Anabel Halon, MD  diclofenac Sodium (VOLTAREN) 1 % GEL See admin instructions. 10/17/16   [provider]  hydrochlorothiazide (MICROZIDE) 12.5 MG capsule Take 1 capsule (12.5 mg total) by mouth daily. 01/26/21   Anabel Halon, MD  hydroxychloroquine (PLAQUENIL) 200 MG tablet Take 2 tablets by mouth daily. 04/17/18   [provider]  labetalol (NORMODYNE) 200 MG tablet SMARTSIG:1 Tablet(s) By Mouth Every 12 Hours 10/01/20    [provider]  meloxicam (MOBIC) 15 MG tablet Take 15 mg by mouth daily.    [provider]  metroNIDAZOLE (FLAGYL) 500 MG tablet Take 1 tablet (500 mg total) by mouth 2 (two) times daily. 02/11/21   Adline Potter, NP  ORENCIA CLICKJECT 125 MG/ML SOAJ Inject 1 pen into the skin once a week. Takes on Tuesdays 06/26/18   Lucretia Roers, MD    Allergies    Lisinopril  Review of Systems   Review of Systems  Constitutional:  Negative for chills and fever.  Respiratory:  Negative for shortness of breath.   Cardiovascular:  Negative for chest pain.  Gastrointestinal:  Negative for abdominal pain.  Musculoskeletal:  Positive for back pain. Negative for arthralgias, gait problem, neck pain and neck stiffness.  Skin:  Negative for rash and wound.  Allergic/Immunologic: Negative for immunocompromised state.  Neurological:  Negative for weakness and headaches.  Hematological:  Does not bruise/bleed easily.  Psychiatric/Behavioral:  Negative for confusion.   All other systems reviewed and are negative.  Physical Exam Updated Vital Signs BP (!) 126/106 (BP Location: Right Arm)   Pulse 98   Temp 98.2 F (36.8 C) (Oral)   Resp 19   Ht 5\' 4"  (1.626 m)   Wt 91.6 kg   SpO2 95%   BMI 34.67 kg/m   Physical Exam Vitals and nursing note reviewed.  Constitutional:      General: She is not in acute distress.    Appearance: She is well-developed. She is not diaphoretic.  HENT:     Head: Normocephalic and atraumatic.  Eyes:     Extraocular Movements: Extraocular movements intact.     Pupils: Pupils are equal, round, and reactive to light.  Pulmonary:     Effort: Pulmonary effort is normal.  Abdominal:     Palpations: Abdomen is soft.     Tenderness: There is no abdominal tenderness.  Musculoskeletal:        General: Tenderness present. No swelling or deformity.     Cervical back: Normal. No tenderness or bony tenderness. No pain with movement.     Thoracic  back: No tenderness or bony tenderness.     Lumbar back: Tenderness and bony tenderness present.       Back:     Right lower leg: No edema.     Left lower leg: No edema.     Comments: No pain with range of motion of hips,  upper or lower extremities.  Does have mild tenderness to the low back.  Skin:    General: Skin is warm and dry.     Findings: No erythema or rash.  Neurological:     Mental Status: She is alert and oriented to person, place, and time.     Cranial Nerves: No cranial nerve deficit.     Sensory: No sensory deficit.     Motor: No weakness.  Psychiatric:        Behavior: Behavior normal.    ED Results / Procedures / Treatments   Labs (all labs ordered are listed, but only abnormal results are displayed) Labs Reviewed - No data to display  EKG None  Radiology DG Lumbar Spine Complete  Result Date: 02/17/2021 CLINICAL DATA:  Fall down steps, back pain EXAM: LUMBAR SPINE - COMPLETE 4+ VIEW COMPARISON:  None. FINDINGS: Diffuse degenerative disc disease and facet disease, most pronounced in the lower lumbar spine at L5-S1. Normal alignment. No fracture. SI joints symmetric. IMPRESSION: No acute bony abnormality. Electronically Signed   By: Charlett Nose M.D.   On: 02/17/2021 17:58    Procedures Procedures   Medications Ordered in ED Medications  HYDROcodone-acetaminophen (NORCO/VICODIN) 5-325 MG per tablet 1 tablet (has no administration in time range)  lidocaine (LIDODERM) 5 % 1 patch (has no administration in time range)    ED Course  I have reviewed the triage vital signs and the nursing notes.  Pertinent labs & imaging results that were available during my care of the patient were reviewed by me and considered in my medical decision making (see chart for details).  Clinical Course as of 02/17/21 1808  Wed Feb 17, 2021  367 60 year old female presents for evaluation of back pain after a fall as above.  Found to have tenderness across her lower back without  crepitus or step-off.  Patient is ambulatory with a steady gait, normal leg strength.  No pain with range of motion of upper or lower extremities or range of motion of the hips.  X-ray lumbar spine is negative for acute bony injury.  Patient is given Norco for pain while in the ED as well as a Lidoderm patch. Patient is discharged with prescription for Lidoderm patches, may also apply Voltaren gel topically if needed, continue with her muscle relaxant.  Encouraged to follow-up with her primary care provider for recheck, may benefit from physical therapy. [LM]    Clinical Course User Index [LM] Alden Hipp   MDM Rules/Calculators/A&P                           Final Clinical Impression(s) / ED Diagnoses Final diagnoses:  Fall down steps, initial encounter  Strain of lumbar region, initial encounter    Rx / DC Orders ED Discharge Orders          Ordered    lidocaine (LIDODERM) 5 %  Every 24 hours        02/17/21 1802             Jeannie Fend, PA-C 02/17/21 Boykin Reaper, MD 02/18/21 1352

## 2021-02-17 NOTE — ED Triage Notes (Addendum)
Pt was walking down the steps to her basement when she lost her footing and slid down almost every step. Complains of lower back pain but ambulatory  Took a muscle relaxer pta.

## 2021-02-18 ENCOUNTER — Telehealth: Payer: Self-pay

## 2021-02-18 NOTE — Telephone Encounter (Signed)
Transition Care Management Follow-up Telephone Call Date of discharge and from where: 02/17/2021 from [facility] How have you been since you were released from the hospital? Pt stated that she is feeling some better. Pt stated that she is able to stand better today then she could yesterday.  Any questions or concerns? No  Items Reviewed: Did the pt receive and understand the discharge instructions provided? Yes  Medications obtained and verified? Yes  Other? No  Any new allergies since your discharge? No  Dietary orders reviewed? No Do you have support at home? Yes   Functional Questionnaire: (I = Independent and D = Dependent) ADLs: I  Bathing/Dressing- I  Meal Prep- I  Eating- I  Maintaining continence- I  Transferring/Ambulation- I  Managing Meds- I   Follow up appointments reviewed:  PCP Hospital f/u appt confirmed? Yes  Scheduled to see Trena Platt, MD on 02/24/2021 @ 11:40am. Specialist Hospital f/u appt confirmed? No   Are transportation arrangements needed? No  If their condition worsens, is the pt aware to call PCP or go to the Emergency Dept.? Yes Was the patient provided with contact information for the PCP's office or ED? Yes Was to pt encouraged to call back with questions or concerns? Yes

## 2021-02-24 ENCOUNTER — Ambulatory Visit: Payer: Medicaid Other | Admitting: Internal Medicine

## 2021-02-24 ENCOUNTER — Other Ambulatory Visit: Payer: Self-pay

## 2021-02-24 ENCOUNTER — Encounter: Payer: Self-pay | Admitting: Internal Medicine

## 2021-02-24 VITALS — BP 142/78 | Resp 18 | Ht 64.0 in | Wt 200.0 lb

## 2021-02-24 DIAGNOSIS — W19XXXD Unspecified fall, subsequent encounter: Secondary | ICD-10-CM

## 2021-02-24 DIAGNOSIS — Z09 Encounter for follow-up examination after completed treatment for conditions other than malignant neoplasm: Secondary | ICD-10-CM

## 2021-02-24 DIAGNOSIS — M47816 Spondylosis without myelopathy or radiculopathy, lumbar region: Secondary | ICD-10-CM | POA: Diagnosis not present

## 2021-02-24 DIAGNOSIS — M51369 Other intervertebral disc degeneration, lumbar region without mention of lumbar back pain or lower extremity pain: Secondary | ICD-10-CM | POA: Insufficient documentation

## 2021-02-24 MED ORDER — PREDNISONE 10 MG (21) PO TBPK: ORAL_TABLET | ORAL | 0 refills | Status: DC

## 2021-02-24 NOTE — Assessment & Plan Note (Signed)
Lumbar spine x-ray from ER reviewed Also has history of RA Takes Tylenol or meloxicam as needed Started Sterapred taper, advised to avoid meloxicam while taking prednisone

## 2021-02-24 NOTE — Progress Notes (Signed)
Acute Office Visit  Subjective:    Patient ID: Alejandra Sanders, female    DOB: 09/04/1960, 60 y.o.   MRN: [id number]  Chief Complaint  Patient presents with   Follow-up    ER follow up pt fell 02-17-21 down steps hurt back told her to follow up with pcp got otc lidocaine patch as the ones the er sent in ins would cover when she walks feels like spasms in her back     HPI Patient is in today for follow-up after recent ER visit.  She had a fall on 11/16 from stairs at home.  She skipped her step and fell about 12-13 steps and hit her back.  Denies any head injury.  Denies any LOC.  Lumbar spine x-ray in ER showed no fractures, but did show degenerative disc disease.  Of note, she also has history of RA.  She has been taking Tylenol/ibuprofen and Flexeril.  She complains of constant low back pain, which is worse with movement and also has back muscle spasms.  Denies any saddle anesthesia, urinary or stool incontinence.  She was given lidocaine patch prescription from the ER, but she was not able to afford it.  Past Medical History:  Diagnosis Date   Arthritis    Calculus of gallbladder with chronic cholecystitis without obstruction    Chronic cholecystitis 06/11/2018   Hypertension    Seizure (HCC)    had seizurea as child; unknow etiology; was on phenobarbital for a few years and then was taken off meds. No seizure in 40 years.   Thyroid disease    Trichimoniasis 02/11/2021   Treated with flagyl 02/11/21    Past Surgical History:  Procedure Laterality Date   CHOLECYSTECTOMY N/A 06/11/2018   Procedure: LAPAROSCOPIC CHOLECYSTECTOMY;  Surgeon: Lucretia Roers, MD;  Location: [facility];  Service: General;  Laterality: N/A;   GALLBLADDER SURGERY  06/11/2018   THYROID SURGERY     removal of thyroid    Family History  Problem Relation Age of Onset   Aneurysm Maternal Grandmother    Hypertension Father    Hypertension Mother    Diabetes Mother    Heart attack Brother    Hypertension  Sister    Kidney failure Sister    Hypertension Sister    Hypertension Sister    Hypertension Son     Social History   Socioeconomic History   Marital status: Single    Spouse name: Not on file   Number of children: Not on file   Years of education: Not on file   Highest education level: Not on file  Occupational History   Not on file  Tobacco Use   Smoking status: Every Day    Packs/day: 0.25    Years: 35.00    Pack years: 8.75    Types: Cigarettes   Smokeless tobacco: Never   Tobacco comments:    states smokes 3 cig daily only  Vaping Use   Vaping Use: Never used  Substance and Sexual Activity   Alcohol use: No   Drug use: No   Sexual activity: Not Currently    Birth control/protection: Post-menopausal  Other Topics Concern   Not on file  Social History Narrative   Not on file   Social Determinants of Health   Financial Resource Strain: Low Risk    Difficulty of Paying Living Expenses: Not hard at all  Food Insecurity: No Food Insecurity   Worried About Radiation protection practitioner of Food in the Last Year: Never  true   Ran Out of Food in the Last Year: Never true  Transportation Needs: No Transportation Needs   Lack of Transportation (Medical): No   Lack of Transportation (Non-Medical): No  Physical Activity: Insufficiently Active   Days of Exercise per Week: 2 days   Minutes of Exercise per Session: 20 min  Stress: No Stress Concern Present   Feeling of Stress : Only a little  Social Connections: Moderately Integrated   Frequency of Communication with Friends and Family: More than three times a week   Frequency of Social Gatherings with Friends and Family: More than three times a week   Attends Religious Services: More than 4 times per year   Active Member of Clubs or Organizations: Yes   Attends Music therapist: More than 4 times per year   Marital Status: Never married  Human resources officer Violence: Not At Risk   Fear of Current or Ex-Partner: No    Emotionally Abused: No   Physically Abused: No   Sexually Abused: No    Outpatient Medications Prior to Visit  Medication Sig Dispense Refill   amLODipine (NORVASC) 10 MG tablet Take 10 mg by mouth daily.     cyclobenzaprine (FLEXERIL) 10 MG tablet Take 1 tablet (10 mg total) by mouth 3 (three) times daily as needed for muscle spasms. 30 tablet 0   diclofenac Sodium (VOLTAREN) 1 % GEL See admin instructions.     hydrochlorothiazide (MICROZIDE) 12.5 MG capsule Take 1 capsule (12.5 mg total) by mouth daily. 90 capsule 1   hydroxychloroquine (PLAQUENIL) 200 MG tablet Take 2 tablets by mouth daily.     labetalol (NORMODYNE) 200 MG tablet SMARTSIG:1 Tablet(s) By Mouth Every 12 Hours     meloxicam (MOBIC) 15 MG tablet Take 15 mg by mouth daily.     metroNIDAZOLE (FLAGYL) 500 MG tablet Take 1 tablet (500 mg total) by mouth 2 (two) times daily. 14 tablet 0   ORENCIA CLICKJECT 0000000 MG/ML SOAJ Inject 1 pen into the skin once a week. Takes on Tuesdays     lidocaine (LIDODERM) 5 % Place 1 patch onto the skin daily. Remove & Discard patch within 12 hours or as directed by MD (Patient not taking: Reported on 02/24/2021) 30 patch 0   No facility-administered medications prior to visit.    Allergies  Allergen Reactions   Lisinopril Swelling    Review of Systems  Constitutional:  Negative for chills and fever.  HENT:  Negative for congestion, sinus pressure and sinus pain.   Eyes:  Negative for pain and discharge.  Respiratory:  Negative for cough and shortness of breath.   Cardiovascular:  Negative for chest pain and palpitations.  Gastrointestinal:  Negative for abdominal pain, diarrhea, nausea and vomiting.  Endocrine: Negative for polydipsia and polyuria.  Genitourinary:  Negative for dysuria and hematuria.  Musculoskeletal:  Positive for arthralgias and back pain. Negative for neck pain and neck stiffness.  Skin:  Negative for rash.  Neurological:  Negative for dizziness and weakness.   Psychiatric/Behavioral:  Negative for agitation and behavioral problems.       Objective:    Physical Exam Vitals reviewed.  Constitutional:      General: She is not in acute distress.    Appearance: She is not diaphoretic.  HENT:     Head: Normocephalic and atraumatic.     Nose: Nose normal. No congestion.     Mouth/Throat:     Mouth: Mucous membranes are moist.     Pharynx:  No posterior oropharyngeal erythema.  Eyes:     General: No scleral icterus.    Extraocular Movements: Extraocular movements intact.  Cardiovascular:     Rate and Rhythm: Normal rate and regular rhythm.     Pulses: Normal pulses.     Heart sounds: Normal heart sounds. No murmur heard. Pulmonary:     Breath sounds: Normal breath sounds. No wheezing or rales.  Musculoskeletal:        General: Tenderness (Lumbar spine area and right paraspinal area) present.     Cervical back: Neck supple. No tenderness.     Right lower leg: No edema.     Left lower leg: No edema.     Comments: R shoulder ROM limited due to pain, no point tenderness  Skin:    General: Skin is warm.     Findings: No rash.  Neurological:     General: No focal deficit present.     Mental Status: She is alert and oriented to person, place, and time.  Psychiatric:        Mood and Affect: Mood normal.        Behavior: Behavior normal.    BP (!) 142/78 (BP Location: Left Arm, Patient Position: Sitting, Cuff Size: Normal)   Resp 18   Ht 5\' 4"  (1.626 m)   Wt 200 lb 0.6 oz (90.7 kg)   BMI 34.34 kg/m  Wt Readings from Last 3 Encounters:  02/24/21 200 lb 0.6 oz (90.7 kg)  02/17/21 202 lb (91.6 kg)  02/08/21 202 lb (91.6 kg)        Assessment & Plan:   Encounter for examination following treatment at hospital ER chart reviewed, including imaging Tylenol and Flexeril as needed Did not get lidocaine patch  Osteoarthritis of lumbar spine Lumbar spine x-ray from ER reviewed Also has history of RA Takes Tylenol or meloxicam as  needed Started Sterapred taper, advised to avoid meloxicam while taking prednisone  Fall, subsequent encounter Mechanical fall at home Had back injury Started Sterapred taper for severe back pain Flexeril PRN for muscle spasms    Meds ordered this encounter  Medications   predniSONE (STERAPRED UNI-PAK 21 TAB) 10 MG (21) TBPK tablet    Sig: Take as package instructions.    Dispense:  1 each    Refill:  0     Sriram Febles Keith Rake, MD

## 2021-02-24 NOTE — Assessment & Plan Note (Signed)
ER chart reviewed, including imaging Tylenol and Flexeril as needed Did not get lidocaine patch

## 2021-02-24 NOTE — Patient Instructions (Signed)
Please take Prednisone as prescribed.  Please take Flexeril for muscle spasms.  If you have persistent pain, please contact your Rheumatologist for possible joint injection.

## 2021-03-25 DIAGNOSIS — E0510 Thyrotoxicosis with toxic single thyroid nodule without thyrotoxic crisis or storm: Secondary | ICD-10-CM | POA: Diagnosis not present

## 2021-03-26 LAB — T3, FREE: T3, Free: 3.1 pg/mL (ref 2.0–4.4)

## 2021-03-26 LAB — T4, FREE: Free T4: 1 ng/dL (ref 0.82–1.77)

## 2021-03-26 LAB — TSH: TSH: 2.12 u[IU]/mL (ref 0.450–4.500)

## 2021-03-31 ENCOUNTER — Other Ambulatory Visit: Payer: Self-pay

## 2021-03-31 ENCOUNTER — Ambulatory Visit: Payer: Medicaid Other | Admitting: Nurse Practitioner

## 2021-03-31 ENCOUNTER — Encounter: Payer: Self-pay | Admitting: Nurse Practitioner

## 2021-03-31 VITALS — BP 157/97 | HR 92 | Ht 64.0 in | Wt 200.0 lb

## 2021-03-31 DIAGNOSIS — E0510 Thyrotoxicosis with toxic single thyroid nodule without thyrotoxic crisis or storm: Secondary | ICD-10-CM | POA: Diagnosis not present

## 2021-03-31 NOTE — Progress Notes (Signed)
03/31/2021     Endocrinology Follow Up Note    Subjective:    Patient ID: Alejandra Sanders, female    DOB: Jun 22, 1960, PCP Anabel Halon, MD.   Past Medical History:  Diagnosis Date   Arthritis    Calculus of gallbladder with chronic cholecystitis without obstruction    Chronic cholecystitis 06/11/2018   Hypertension    Seizure (HCC)    had seizurea as child; unknow etiology; was on phenobarbital for a few years and then was taken off meds. No seizure in 40 years.   Thyroid disease    Trichimoniasis 02/11/2021   Treated with flagyl 02/11/21    Past Surgical History:  Procedure Laterality Date   CHOLECYSTECTOMY N/A 06/11/2018   Procedure: LAPAROSCOPIC CHOLECYSTECTOMY;  Surgeon: Lucretia Roers, MD;  Location: [facility];  Service: General;  Laterality: N/A;   GALLBLADDER SURGERY  06/11/2018   THYROID SURGERY     removal of thyroid    Social History   Socioeconomic History   Marital status: Single    Spouse name: Not on file   Number of children: Not on file   Years of education: Not on file   Highest education level: Not on file  Occupational History   Not on file  Tobacco Use   Smoking status: Every Day    Packs/day: 0.25    Years: 35.00    Pack years: 8.75    Types: Cigarettes   Smokeless tobacco: Never   Tobacco comments:    states smokes 3 cig daily only  Vaping Use   Vaping Use: Never used  Substance and Sexual Activity   Alcohol use: No   Drug use: No   Sexual activity: Not Currently    Birth control/protection: Post-menopausal  Other Topics Concern   Not on file  Social History Narrative   Not on file   Social Determinants of Health   Financial Resource Strain: Low Risk    Difficulty of Paying Living Expenses: Not hard at all  Food Insecurity: No Food Insecurity   Worried About Programme researcher, broadcasting/film/video in the Last Year: Never true   Ran Out of Food in the Last Year: Never true  Transportation Needs: No Transportation Needs   Lack of  Transportation (Medical): No   Lack of Transportation (Non-Medical): No  Physical Activity: Insufficiently Active   Days of Exercise per Week: 2 days   Minutes of Exercise per Session: 20 min  Stress: No Stress Concern Present   Feeling of Stress : Only a little  Social Connections: Moderately Integrated   Frequency of Communication with Friends and Family: More than three times a week   Frequency of Social Gatherings with Friends and Family: More than three times a week   Attends Religious Services: More than 4 times per year   Active Member of [employer] or Organizations: Yes   Attends Engineer, structural: More than 4 times per year   Marital Status: Never married    Family History  Problem Relation Age of Onset   Aneurysm Maternal Grandmother    Hypertension Father    Hypertension Mother    Diabetes Mother    Heart attack Brother    Hypertension Sister    Kidney failure Sister    Hypertension Sister    Hypertension Sister    Hypertension Son     Outpatient Encounter Medications as of 03/31/2021  Medication Sig   amLODipine (NORVASC) 10 MG tablet Take 10 mg by mouth  daily.   cyclobenzaprine (FLEXERIL) 10 MG tablet Take 1 tablet (10 mg total) by mouth 3 (three) times daily as needed for muscle spasms.   diclofenac Sodium (VOLTAREN) 1 % GEL See admin instructions.   hydrochlorothiazide (MICROZIDE) 12.5 MG capsule Take 1 capsule (12.5 mg total) by mouth daily.   hydroxychloroquine (PLAQUENIL) 200 MG tablet Take 2 tablets by mouth daily.   labetalol (NORMODYNE) 200 MG tablet SMARTSIG:1 Tablet(s) By Mouth Every 12 Hours   lidocaine (LIDODERM) 5 % Place 1 patch onto the skin daily. Remove & Discard patch within 12 hours or as directed by MD (Patient not taking: Reported on 02/24/2021)   meloxicam (MOBIC) 15 MG tablet Take 15 mg by mouth daily.   metroNIDAZOLE (FLAGYL) 500 MG tablet Take 1 tablet (500 mg total) by mouth 2 (two) times daily.   ORENCIA CLICKJECT 125 MG/ML SOAJ  Inject 1 pen into the skin once a week. Takes on Tuesdays   [DISCONTINUED] predniSONE (STERAPRED UNI-PAK 21 TAB) 10 MG (21) TBPK tablet Take as package instructions.   No facility-administered encounter medications on file as of 03/31/2021.    ALLERGIES: Allergies  Allergen Reactions   Lisinopril Swelling    VACCINATION STATUS: Immunization History  Administered Date(s) Administered   Influenza,inj,Quad PF,6+ Mos 12/15/2020   Moderna SARS-COV2 Booster Vaccination 06/14/2019, 02/22/2020   Moderna Sars-Covid-2 Vaccination 05/16/2019     HPI  Alejandra Sanders is 60 y.o. female who presents today with a medical history as above. she is being seen in follow up after being seen in consultation for hyperthyroidism requested by Anabel Halon, MD.  she has been dealing with symptoms of insomnia but no other obvious symptoms of hyperthyroidism.  her most recent thyroid labs revealed suppressed TSH of 0.053 on 12/28/20.  She has a history of partial thyroidectomy as a result of large goiter back in 2001.  She never needed thyroid hormone replacement after her surgery.    she denies dysphagia, choking, shortness of breath, no recent voice change.    she does have family history of thyroid dysfunction in her sister and cousin (both requiring surgery 1 with goiter the other with hyperthyroidism), but denies family hx of thyroid cancer.  she is not on any anti-thyroid medications nor on any thyroid hormone supplements. Denies use of Biotin containing supplements.  she is willing to proceed with appropriate work up and therapy for thyrotoxicosis.   Review of systems  Constitutional: + Minimally fluctuating body weight, current Body mass index is 34.33 kg/m., + fatigue, no subjective hyperthermia, no subjective hypothermia Eyes: no blurry vision, no xerophthalmia ENT: no sore throat, no nodules palpated in throat, no dysphagia/odynophagia, no hoarseness Cardiovascular: no chest pain, no shortness  of breath, no palpitations, no leg swelling Respiratory: no cough, no shortness of breath Gastrointestinal: no nausea/vomiting/diarrhea Musculoskeletal: no muscle/joint aches Skin: no rashes, no hyperemia Neurological: no tremors, no numbness, no tingling, no dizziness Psychiatric: no depression, no anxiety, insomnia   Objective:    BP (!) 157/97    Pulse 92    Ht  (1.626 m)    Wt 200 lb (90.7 kg)    BMI 34.33 kg/m   Wt Readings from Last 3 Encounters:  03/31/21 200 lb (90.7 kg)  02/24/21 200 lb 0.6 oz (90.7 kg)  02/17/21 202 lb (91.6 kg)     BP Readings from Last 3 Encounters:  03/31/21 (!) 157/97  02/24/21 (!) 142/78  02/17/21 126/74         Physical Exam-  Limited  Constitutional:  Body mass index is 34.33 kg/m. , not in acute distress, slightly anxious state of mind Eyes:  EOMI, no exophthalmos Neck: Supple, surgical scar barely visible from partial thyroidectomy Thyroid: No gross goiter Cardiovascular: RRR, no murmurs, rubs, or gallops, no edema Respiratory: Adequate breathing efforts, no crackles, rales, rhonchi, or wheezing Musculoskeletal: no gross deformities, strength intact in all four extremities, no gross restriction of joint movements Skin:  no rashes, no hyperemia Neurological: no tremor with outstretched hands   CMP     Component Value Date/Time   NA 141 12/28/2020 0813   K 4.1 12/28/2020 0813   CL 103 12/28/2020 0813   CO2 24 12/28/2020 0813   GLUCOSE 98 12/28/2020 0813   GLUCOSE 105 (H) 05/27/2018 1528   BUN 11 12/28/2020 0813   CREATININE 0.66 12/28/2020 0813   CALCIUM 9.0 12/28/2020 0813   PROT 6.7 12/28/2020 0813   ALBUMIN 4.0 12/28/2020 0813   AST 12 12/28/2020 0813   ALT 12 12/28/2020 0813   ALKPHOS 148 (H) 12/28/2020 0813   BILITOT 0.3 12/28/2020 0813   GFRNONAA >60 05/27/2018 1528   GFRAA >60 05/27/2018 1528     CBC    Component Value Date/Time   WBC 4.9 12/28/2020 0813   WBC 8.9 06/12/2018 0440   RBC 5.21 12/28/2020  0813   RBC 4.41 06/12/2018 0440   HGB 13.2 12/28/2020 0813   HCT 40.8 12/28/2020 0813   PLT 266 12/28/2020 0813   MCV 78 (L) 12/28/2020 0813   MCH 25.3 (L) 12/28/2020 0813   MCH 25.9 (L) 06/12/2018 0440   MCHC 32.4 12/28/2020 0813   MCHC 31.1 06/12/2018 0440   RDW 13.0 12/28/2020 0813   LYMPHSABS 2.1 12/28/2020 0813   EOSABS 0.2 12/28/2020 0813   BASOSABS 0.0 12/28/2020 0813     Diabetic Labs (most recent): No results found for: HGBA1C  Lipid Panel     Component Value Date/Time   CHOL 175 12/28/2020 0813   TRIG 94 12/28/2020 0813   HDL 33 (L) 12/28/2020 0813   CHOLHDL 5.3 (H) 12/28/2020 0813   LDLCALC 125 (H) 12/28/2020 0813   LABVLDL 17 12/28/2020 0813     Lab Results  Component Value Date   TSH 2.120 03/25/2021   TSH 0.048 (L) 01/07/2021   TSH 0.053 (L) 12/28/2020   FREET4 1.00 03/25/2021   FREET4 1.32 01/07/2021     Results for Alejandra Sanders, Alejandra Sanders (MRN [id number]) as of 01/14/2021 10:00  Ref. Range 12/28/2020 08:13 01/07/2021 10:53  TSH Latest Ref Range: 0.450 - 4.500 uIU/mL 0.053 (L) 0.048 (L)  Triiodothyronine,Free,Serum Latest Ref Range: 2.0 - 4.4 pg/mL  3.9  T4,Free(Direct) Latest Ref Range: 0.82 - 1.77 ng/dL  0.86  Thyroperoxidase Ab SerPl-aCnc Latest Ref Range: 0 - 34 IU/mL  10  Thyroglobulin Antibody Latest Ref Range: 0.0 - 0.9 IU/mL  <1.0   US Thyroid 01/08/21 CLINICAL DATA:  60 year old female with a history hyperthyroidism   EXAM: THYROID ULTRASOUND   TECHNIQUE: Ultrasound examination of the thyroid gland and adjacent soft tissues was performed.   COMPARISON:  None.   FINDINGS: Parenchymal Echotexture: Markedly heterogenous   Isthmus: 0.8 cm   Right lobe: 4.8 cm x 2.4 cm x 2.5 cm   Left lobe: 5.9 cm x 3.3 cm x 4.4 cm   _________________________________________________________   Estimated total number of nodules >/= 1 cm: 6-10   Number of spongiform nodules >/=  2 cm not described below (TR1): 0   Number of mixed cystic  and solid nodules  >/= 1.5 cm not described below (TR2): 0   _________________________________________________________   Nodule # 1:   Location: Isthmus; mid   Maximum size: 1.3 cm; Other 2 dimensions: 0.9 cm x 0.7 cm   Composition: cannot determine (2)   Echogenicity: isoechoic (1)   Shape: not taller-than-wide (0)   Margins: ill-defined (0)   Echogenic foci: none (0)   ACR TI-RADS total points: 3.   ACR TI-RADS risk category: TR3 (3 points).   ACR TI-RADS recommendations:   Nodule does not meet criteria for surveillance or biopsy   _________________________________________________________   Nodule # 2:   Location: Right; Superior   Maximum size: 3.0 cm; Other 2 dimensions: 2.1 cm x 2.0 cm   Composition: solid/almost completely solid (2)   Echogenicity: isoechoic (1)   Shape: not taller-than-wide (0)   Margins: ill-defined (0)   Echogenic foci: none (0)   ACR TI-RADS total points: 3.   ACR TI-RADS risk category: TR3 (3 points).   ACR TI-RADS recommendations:   Nodule meets criteria for biopsy   _________________________________________________________   Nodule # 3:   Location: Right; Mid   Maximum size: 2.0 cm; Other 2 dimensions: 1.9 cm x 1.5 cm   Composition: cannot determine (2)   Echogenicity: isoechoic (1)   Shape: not taller-than-wide (0)   Margins: ill-defined (0)   Echogenic foci: none (0)   ACR TI-RADS total points: 3.   ACR TI-RADS risk category: TR3 (3 points).   ACR TI-RADS recommendations:   Nodule meets criteria for surveillance   _________________________________________________________   Nodule # 4:   Location: Left; Superior   Maximum size: 1.7 cm; Other 2 dimensions: 1.7 cm x 1.5 cm   Composition: solid/almost completely solid (2)   Echogenicity: isoechoic (1)   Shape: not taller-than-wide (0)   Margins: smooth (0)   Echogenic foci: none (0)   ACR TI-RADS total points: 3.   ACR TI-RADS risk category: TR3 (3  points).   ACR TI-RADS recommendations:   Nodule meets criteria for surveillance   _________________________________________________________   Nodule # 5:   Location: Left; Mid   Maximum size: 2.3 cm; Other 2 dimensions: 2.0 cm x 1.8 cm   Composition: solid/almost completely solid (2)   Echogenicity: hyperechoic (1)   Shape: not taller-than-wide (0)   Margins: ill-defined (0)   Echogenic foci: none (0)   ACR TI-RADS total points: 3.   ACR TI-RADS risk category: TR3 (3 points).   ACR TI-RADS recommendations:   Nodule meets criteria for surveillance   _________________________________________________________   Nodule # 6:   Location: Left; Mid   Maximum size: 1.8 cm; Other 2 dimensions: 1.5 cm x 1.4 cm   Composition: mixed cystic and solid (1)   Echogenicity: isoechoic (1)   Shape: not taller-than-wide (0)   Margins: ill-defined (0)   Echogenic foci: none (0)   ACR TI-RADS total points: 2.   ACR TI-RADS risk category: TR2 (2 points).   ACR TI-RADS recommendations:   Cystic nodule does not meet criteria for surveillance or biopsy   _________________________________________________________   Nodule # 7:   Location: Left; Inferior   Maximum size: 2.4 cm; Other 2 dimensions: 2.4 cm x 2.1 cm   Composition: solid/almost completely solid (2)   Echogenicity: isoechoic (1)   Shape: not taller-than-wide (0)   Margins: ill-defined (0)   Echogenic foci: none (0)   ACR TI-RADS total points: 3.   ACR TI-RADS risk category: TR3 (3 points).   ACR  TI-RADS recommendations:   Nodule meets criteria for surveillance   _________________________________________________________   No adenopathy   IMPRESSION: Multinodular thyroid.   Right superior thyroid nodule (labeled 2, 3.0 cm, TR 3) meets criteria for biopsy, as designated by the newly established ACR TI-RADS criteria, and referral for biopsy is recommended.   Right inferior thyroid nodule  (labeled 3), left superior thyroid nodules (labeled 4 and labeled 5), and left inferior thyroid nodule (labeled 7) all meet criteria for surveillance, as designated by the newly established ACR TI-RADS criteria. Surveillance ultrasound study recommended to be performed annually up to 5 years.   Recommendations follow those established by the new ACR TI-RADS criteria (J Am Coll Radiol 2017;14:587-595).     Electronically Signed   By: Gilmer Mor D.O.   On: 01/08/2021 14:12 --------------------------------------------------------------------------------------------------------------  Uptake and scan 01/26/21 CLINICAL DATA:  Suppressed TSH, prior partial thyroidectomy, multinodular goiter question toxic nodule, hyperthyroidism   EXAM: THYROID SCAN AND UPTAKE - 4 AND 24 HOURS   TECHNIQUE: Following oral administration of I-123 capsule, anterior planar imaging was acquired at 24 hours. Thyroid uptake was calculated with a thyroid probe at 4-6 hours and 24 hours.   RADIOPHARMACEUTICALS:  315 uCi I-123 sodium iodide p.o.   COMPARISON:  Thyroid ultrasound 01/08/2021   FINDINGS: Focal hot nodule in the inferior pole of LEFT thyroid lobe consistent with toxic adenoma.   This corresponds to a 2.5 cm diameter nodule on ultrasound at the inferior pole.   Suppression of uptake in remaining thyroid lobes.   No additional warm nodules.   Subtle areas of decreased tracer uptake, may represent suppression or subtle nodules.   4 hour I-123 uptake = 6.3 % (normal 5-20%)   24 hour I-123 uptake = 14.8% (normal 10-30%)   IMPRESSION: Normal 4 hour and 24 hour radio iodine uptakes.   Hot nodule inferior pole LEFT thyroid lobe corresponding to a 2.5 cm diameter nodule seen on prior ultrasound consistent with toxic adenoma.   Suppression of uptake in remaining thyroid tissue.     Electronically Signed   By: Ulyses Southward M.D.   On: 01/27/2021 16:01   Latest Reference Range & Units  12/28/20 08:13 01/07/21 10:53 03/25/21 08:41  TSH 0.450 - 4.500 uIU/mL 0.053 (L) 0.048 (L) 2.120  Triiodothyronine,Free,Serum 2.0 - 4.4 pg/mL  3.9 3.1  T4,Free(Direct) 0.82 - 1.77 ng/dL  1.61 0.96  Thyroperoxidase Ab SerPl-aCnc 0 - 34 IU/mL  10   Thyroglobulin Antibody 0.0 - 0.9 IU/mL  <1.0   (L): Data is abnormally low  Assessment & Plan:   1. 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  Her recent uptake and scan showed hot nodule in left inferior pole, consistent with toxic thyroid nodule.   -She had RAI ablation on 02/11/21.  -Her repeat thyroid function tests show improvement in her thyroid function-euthyroid presentation.  There is a possibility that she will not need thyroid hormone replacement in the future.  Will repeat thyroid function tests in 2 months for surveillance.     -Patient is advised to maintain close follow up with Anabel Halon, MD for primary care needs.    I spent 20 minutes in the care of the patient today including review of labs from Thyroid Function, CMP, and other relevant labs ; imaging/biopsy records (current and previous including abstractions from other facilities); face-to-face time discussing  her lab results and symptoms, medications  doses, her options of short and long term treatment based on the latest standards of care / guidelines;   and documenting the encounter.  Seraya Isenberg  participated in the discussions, expressed understanding, and voiced agreement with the above plans.  All questions were answered to her satisfaction. she is encouraged to contact clinic should she have any questions or concerns prior to her return visit.    Follow up plan: Return in about 2 months (around 06/01/2021) for Thyroid follow up, Previsit labs.   Thank you for involving me in the care of this pleasant patient, and I will continue to update you with her  progress.   Ronny Bacon, [facility] [address] [place], [place] [address] Phone: [phone] Fax: [phone]  03/31/2021, 9:32 AM

## 2021-05-26 DIAGNOSIS — E0510 Thyrotoxicosis with toxic single thyroid nodule without thyrotoxic crisis or storm: Secondary | ICD-10-CM | POA: Diagnosis not present

## 2021-05-27 LAB — T4, FREE: Free T4: 1.17 ng/dL (ref 0.82–1.77)

## 2021-05-27 LAB — TSH: TSH: 1.17 u[IU]/mL (ref 0.450–4.500)

## 2021-05-27 LAB — T3, FREE: T3, Free: 3.1 pg/mL (ref 2.0–4.4)

## 2021-06-01 ENCOUNTER — Ambulatory Visit: Payer: Medicaid Other | Admitting: Nurse Practitioner

## 2021-06-04 ENCOUNTER — Ambulatory Visit: Payer: Medicaid Other | Admitting: Nurse Practitioner

## 2021-06-17 ENCOUNTER — Ambulatory Visit: Payer: Medicaid Other | Admitting: Nurse Practitioner

## 2021-06-17 ENCOUNTER — Encounter: Payer: Self-pay | Admitting: Nurse Practitioner

## 2021-06-17 ENCOUNTER — Other Ambulatory Visit: Payer: Self-pay

## 2021-06-17 VITALS — BP 116/75 | HR 78 | Ht 64.0 in | Wt 200.8 lb

## 2021-06-17 DIAGNOSIS — E0510 Thyrotoxicosis with toxic single thyroid nodule without thyrotoxic crisis or storm: Secondary | ICD-10-CM | POA: Diagnosis not present

## 2021-06-17 NOTE — Progress Notes (Signed)
? ? ? 06/17/2021   ? ? ?Endocrinology Follow Up Note  ? ? ?Subjective:  ? ? Patient ID: Alejandra Sanders, female    DOB: 1960/07/26, PCP Lindell Spar, MD. ? ? ?Past Medical History:  ?Diagnosis Date  ? Arthritis   ? Calculus of gallbladder with chronic cholecystitis without obstruction   ? Chronic cholecystitis 06/11/2018  ? Hypertension   ? Seizure ([place])   ? had seizurea as child; unknow etiology; was on phenobarbital for a few years and then was taken off meds. No seizure in 40 years.  ? Thyroid disease   ? Trichimoniasis 02/11/2021  ? Treated with flagyl 02/11/21  ? ? ?Past Surgical History:  ?Procedure Laterality Date  ? CHOLECYSTECTOMY N/A 06/11/2018  ? Procedure: LAPAROSCOPIC CHOLECYSTECTOMY;  Surgeon: Virl Cagey, MD;  Location: [facility];  Service: General;  Laterality: N/A;  ? GALLBLADDER SURGERY  06/11/2018  ? THYROID SURGERY    ? removal of thyroid  ? ? ?Social History  ? ?Socioeconomic History  ? Marital status: Single  ?  Spouse name: Not on file  ? Number of children: Not on file  ? Years of education: Not on file  ? Highest education level: Not on file  ?Occupational History  ? Not on file  ?Tobacco Use  ? Smoking status: Every Day  ?  Packs/day: 0.25  ?  Years: 35.00  ?  Pack years: 8.75  ?  Types: Cigarettes  ? Smokeless tobacco: Never  ? Tobacco comments:  ?  states smokes 3 cig daily only  ?Vaping Use  ? Vaping Use: Never used  ?Substance and Sexual Activity  ? Alcohol use: No  ? Drug use: No  ? Sexual activity: Not Currently  ?  Birth control/protection: Post-menopausal  ?Other Topics Concern  ? Not on file  ?Social History Narrative  ? Not on file  ? ?Social Determinants of Health  ? ?Financial Resource Strain: Low Risk   ? Difficulty of Paying Living Expenses: Not hard at all  ?Food Insecurity: No Food Insecurity  ? Worried About Charity fundraiser in the Last Year: Never true  ? Ran Out of Food in the Last Year: Never true  ?Transportation Needs: No Transportation Needs  ? Lack of  Transportation (Medical): No  ? Lack of Transportation (Non-Medical): No  ?Physical Activity: Insufficiently Active  ? Days of Exercise per Week: 2 days  ? Minutes of Exercise per Session: 20 min  ?Stress: No Stress Concern Present  ? Feeling of Stress : Only a little  ?Social Connections: Moderately Integrated  ? Frequency of Communication with Friends and Family: More than three times a week  ? Frequency of Social Gatherings with Friends and Family: More than three times a week  ? Attends Religious Services: More than 4 times per year  ? Active Member of Clubs or Organizations: Yes  ? Attends Archivist Meetings: More than 4 times per year  ? Marital Status: Never married  ? ? ?Family History  ?Problem Relation Age of Onset  ? Aneurysm Maternal Grandmother   ? Hypertension Father   ? Hypertension Mother   ? Diabetes Mother   ? Heart attack Brother   ? Hypertension Sister   ? Kidney failure Sister   ? Hypertension Sister   ? Hypertension Sister   ? Hypertension Son   ? ? ?Outpatient Encounter Medications as of 06/17/2021  ?Medication Sig  ? amLODipine (NORVASC) 10 MG tablet Take 10 mg by mouth  daily.  ? cyclobenzaprine (FLEXERIL) 10 MG tablet Take 1 tablet (10 mg total) by mouth 3 (three) times daily as needed for muscle spasms.  ? diclofenac Sodium (VOLTAREN) 1 % GEL See admin instructions.  ? hydrochlorothiazide (MICROZIDE) 12.5 MG capsule Take 1 capsule (12.5 mg total) by mouth daily.  ? hydroxychloroquine (PLAQUENIL) 200 MG tablet Take 2 tablets by mouth daily.  ? labetalol (NORMODYNE) 200 MG tablet SMARTSIG:1 Tablet(s) By Mouth Every 12 Hours  ? lidocaine (LIDODERM) 5 % Place 1 patch onto the skin daily. Remove & Discard patch within 12 hours or as directed by MD  ? meloxicam (MOBIC) 15 MG tablet Take 15 mg by mouth daily.  ? ORENCIA CLICKJECT 0000000 MG/ML SOAJ Inject 1 pen into the skin once a week. Takes on Tuesdays  ? metroNIDAZOLE (FLAGYL) 500 MG tablet Take 1 tablet (500 mg total) by mouth 2 (two)  times daily. (Patient not taking: Reported on 06/17/2021)  ? ?No facility-administered encounter medications on file as of 06/17/2021.  ? ? ?ALLERGIES: ?Allergies  ?Allergen Reactions  ? Lisinopril Swelling  ? ? ?VACCINATION STATUS: ?Immunization History  ?Administered Date(s) Administered  ? Influenza,inj,Quad PF,6+ Mos 12/15/2020  ? Moderna SARS-COV2 Booster Vaccination 06/14/2019, 02/22/2020  ? Moderna Sars-Covid-2 Vaccination 05/16/2019  ? ? ? ?HPI ? ?Alejandra Sanders is 61 y.o. female who presents today with a medical history as above. she is being seen in follow up after being seen in consultation for hyperthyroidism requested by Lindell Spar, MD.  She has a history of partial thyroidectomy as a result of large goiter back in 2001.  She never needed thyroid hormone replacement after her surgery.  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 ? ?she denies dysphagia, choking, shortness of breath, no recent voice change.  ?  ?she does have family history of thyroid dysfunction in her sister and cousin (both requiring surgery 1 with goiter the other with hyperthyroidism), but denies family hx of thyroid cancer.  she is not on any anti-thyroid medications nor on any thyroid hormone supplements. Denies use of Biotin containing supplements.  she is willing to proceed with appropriate work up and therapy for thyrotoxicosis. ? ? ?Review of systems ? ?Constitutional: + Minimally fluctuating body weight,  current Body mass index is 34.47 kg/m?. , no fatigue, no subjective hyperthermia, no subjective hypothermia ?Eyes: no blurry vision, no xerophthalmia ?ENT: no sore throat, no nodules palpated in throat, no dysphagia/odynophagia, no hoarseness ?Cardiovascular: no chest pain, no shortness of breath, no palpitations, no leg swelling ?Respiratory: no cough, no shortness of breath ?Gastrointestinal: no nausea/vomiting/diarrhea ?Musculoskeletal: no muscle/joint aches ?Skin: no rashes, no hyperemia ?Neurological: no tremors, no  numbness, no tingling, no dizziness ?Psychiatric: no depression, no anxiety ? ? ?Objective:  ?  ?BP 116/75   Pulse 78   Ht 5\' 4"  (1.626 m)   Wt 200 lb 12.8 oz (91.1 kg)   SpO2 100%   BMI 34.47 kg/m?   ?Wt Readings from Last 3 Encounters:  ?06/17/21 200 lb 12.8 oz (91.1 kg)  ?03/31/21 200 lb (90.7 kg)  ?02/24/21 200 lb 0.6 oz (90.7 kg)  ?  ? ?BP Readings from Last 3 Encounters:  ?06/17/21 116/75  ?03/31/21 (!) 157/97  ?02/24/21 (!) 142/78  ? ?     ? ?Physical Exam- Limited ? ?Constitutional:  Body mass index is 34.47 kg/m?. , not in acute distress, normal state of mind ?Eyes:  EOMI, no exophthalmos ?Neck: Supple, surgical scar barely visible from partial thyroidectomy ?Thyroid:  No gross goiter ?Cardiovascular: RRR, no murmurs, rubs, or gallops, no edema ?Respiratory: Adequate breathing efforts, no crackles, rales, rhonchi, or wheezing ?Musculoskeletal: no gross deformities, strength intact in all four extremities, no gross restriction of joint movements ?Skin:  no rashes, no hyperemia ?Neurological: no tremor with outstretched hands ? ? ?CMP  ?   ?Component Value Date/Time  ? NA 141 12/28/2020 0813  ? K 4.1 12/28/2020 0813  ? CL 103 12/28/2020 0813  ? CO2 24 12/28/2020 0813  ? GLUCOSE 98 12/28/2020 0813  ? GLUCOSE 105 (H) 05/27/2018 1528  ? BUN 11 12/28/2020 0813  ? CREATININE 0.66 12/28/2020 0813  ? CALCIUM 9.0 12/28/2020 0813  ? PROT 6.7 12/28/2020 0813  ? ALBUMIN 4.0 12/28/2020 0813  ? AST 12 12/28/2020 0813  ? ALT 12 12/28/2020 0813  ? ALKPHOS 148 (H) 12/28/2020 0813  ? BILITOT 0.3 12/28/2020 0813  ? GFRNONAA >60 05/27/2018 1528  ? GFRAA >60 05/27/2018 1528  ? ? ? ?CBC ?   ?Component Value Date/Time  ? WBC 4.9 12/28/2020 0813  ? WBC 8.9 06/12/2018 0440  ? RBC 5.21 12/28/2020 0813  ? RBC 4.41 06/12/2018 0440  ? HGB 13.2 12/28/2020 0813  ? HCT 40.8 12/28/2020 0813  ? PLT 266 12/28/2020 0813  ? MCV 78 (L) 12/28/2020 0813  ? MCH 25.3 (L) 12/28/2020 0813  ? MCH 25.9 (L) 06/12/2018 0440  ? MCHC 32.4 12/28/2020  0813  ? MCHC 31.1 06/12/2018 0440  ? RDW 13.0 12/28/2020 0813  ? LYMPHSABS 2.1 12/28/2020 0813  ? EOSABS 0.2 12/28/2020 0813  ? BASOSABS 0.0 12/28/2020 0813  ? ? ? ?Diabetic Labs (most recent): ?No results found

## 2021-07-27 ENCOUNTER — Ambulatory Visit (INDEPENDENT_AMBULATORY_CARE_PROVIDER_SITE_OTHER): Payer: Medicaid Other | Admitting: Internal Medicine

## 2021-07-27 ENCOUNTER — Encounter: Payer: Self-pay | Admitting: Internal Medicine

## 2021-07-27 VITALS — BP 118/84 | Resp 18 | Ht 65.0 in | Wt 201.2 lb

## 2021-07-27 DIAGNOSIS — E782 Mixed hyperlipidemia: Secondary | ICD-10-CM | POA: Diagnosis not present

## 2021-07-27 DIAGNOSIS — I10 Essential (primary) hypertension: Secondary | ICD-10-CM

## 2021-07-27 DIAGNOSIS — Z114 Encounter for screening for human immunodeficiency virus [HIV]: Secondary | ICD-10-CM

## 2021-07-27 DIAGNOSIS — Z720 Tobacco use: Secondary | ICD-10-CM | POA: Diagnosis not present

## 2021-07-27 DIAGNOSIS — Z1211 Encounter for screening for malignant neoplasm of colon: Secondary | ICD-10-CM

## 2021-07-27 DIAGNOSIS — E559 Vitamin D deficiency, unspecified: Secondary | ICD-10-CM | POA: Diagnosis not present

## 2021-07-27 DIAGNOSIS — Z1159 Encounter for screening for other viral diseases: Secondary | ICD-10-CM | POA: Diagnosis not present

## 2021-07-27 DIAGNOSIS — R7303 Prediabetes: Secondary | ICD-10-CM | POA: Diagnosis not present

## 2021-07-27 DIAGNOSIS — M069 Rheumatoid arthritis, unspecified: Secondary | ICD-10-CM

## 2021-07-27 DIAGNOSIS — E0510 Thyrotoxicosis with toxic single thyroid nodule without thyrotoxic crisis or storm: Secondary | ICD-10-CM | POA: Diagnosis not present

## 2021-07-27 DIAGNOSIS — M1991 Primary osteoarthritis, unspecified site: Secondary | ICD-10-CM

## 2021-07-27 MED ORDER — HYDROCHLOROTHIAZIDE 12.5 MG PO CAPS: 12.5000 mg | ORAL_CAPSULE | Freq: Every day | ORAL | 1 refills | Status: DC

## 2021-07-27 MED ORDER — LABETALOL HCL 200 MG PO TABS: 200.0000 mg | ORAL_TABLET | Freq: Two times a day (BID) | ORAL | 1 refills | Status: DC

## 2021-07-27 MED ORDER — AMLODIPINE BESYLATE 10 MG PO TABS
10.0000 mg | ORAL_TABLET | Freq: Every day | ORAL | 1 refills | Status: DC
Start: 2021-07-27 — End: 2022-01-27

## 2021-07-27 NOTE — Assessment & Plan Note (Signed)
B/l knee pain likely due to OA ?Followed by rheumatology ?

## 2021-07-27 NOTE — Progress Notes (Signed)
? ?Established Patient Office Visit ? ?Subjective:  ?Patient ID: Alejandra Sanders, female    DOB: 07-Jun-1960  Age: 61 y.o. MRN: [id number] ? ?CC:  ?Chief Complaint  ?Patient presents with  ? Follow-up  ?  6 month follow up HTN hyperthoyroidism pt knees have been hurting on and off when standing   ? ? ?HPI ?Alejandra Sanders is a 61 y.o. female with past medical history of HTN, RA, toxic thyroid nodule and tobacco abuse who presents for f/u of her chronic medical conditions. ? ?HTN: BP is well-controlled. Takes medications regularly. Patient denies headache, dizziness, chest pain, dyspnea or palpitations. ? ?She has a history of RA, for which she takes Plaquenil and Orencia. She follows up with rheumatologist in [place].  She has not had ophthalmology visit recently since starting Plaquenil.  Denies any vision problem currently.  She complains of bilateral knee pain, which is worse with walking and prolonged standing. ? ?She has had RAI ablation of toxic thyroid nodule.  Her TSH and free T4 have been WNL without needing thyroid supplement. ? ? ?Past Medical History:  ?Diagnosis Date  ? Arthritis   ? Calculus of gallbladder with chronic cholecystitis without obstruction   ? Chronic cholecystitis 06/11/2018  ? Hypertension   ? Seizure ([place])   ? had seizurea as child; unknow etiology; was on phenobarbital for a few years and then was taken off meds. No seizure in 40 years.  ? Thyroid disease   ? Trichimoniasis 02/11/2021  ? Treated with flagyl 02/11/21  ? ? ?Past Surgical History:  ?Procedure Laterality Date  ? CHOLECYSTECTOMY N/A 06/11/2018  ? Procedure: LAPAROSCOPIC CHOLECYSTECTOMY;  Surgeon: Virl Cagey, MD;  Location: [facility];  Service: General;  Laterality: N/A;  ? GALLBLADDER SURGERY  06/11/2018  ? THYROID SURGERY    ? removal of thyroid  ? ? ?Family History  ?Problem Relation Age of Onset  ? Aneurysm Maternal Grandmother   ? Hypertension Father   ? Hypertension Mother   ? Diabetes Mother   ? Heart attack Brother    ? Hypertension Sister   ? Kidney failure Sister   ? Hypertension Sister   ? Hypertension Sister   ? Hypertension Son   ? ? ?Social History  ? ?Socioeconomic History  ? Marital status: Single  ?  Spouse name: Not on file  ? Number of children: Not on file  ? Years of education: Not on file  ? Highest education level: Not on file  ?Occupational History  ? Not on file  ?Tobacco Use  ? Smoking status: Every Day  ?  Packs/day: 0.25  ?  Years: 35.00  ?  Pack years: 8.75  ?  Types: Cigarettes  ? Smokeless tobacco: Never  ? Tobacco comments:  ?  states smokes 3 cig daily only  ?Vaping Use  ? Vaping Use: Never used  ?Substance and Sexual Activity  ? Alcohol use: No  ? Drug use: No  ? Sexual activity: Not Currently  ?  Birth control/protection: Post-menopausal  ?Other Topics Concern  ? Not on file  ?Social History Narrative  ? Not on file  ? ?Social Determinants of Health  ? ?Financial Resource Strain: Low Risk   ? Difficulty of Paying Living Expenses: Not hard at all  ?Food Insecurity: No Food Insecurity  ? Worried About Charity fundraiser in the Last Year: Never true  ? Ran Out of Food in the Last Year: Never true  ?Transportation Needs: No Transportation Needs  ? Lack  of Transportation (Medical): No  ? Lack of Transportation (Non-Medical): No  ?Physical Activity: Insufficiently Active  ? Days of Exercise per Week: 2 days  ? Minutes of Exercise per Session: 20 min  ?Stress: No Stress Concern Present  ? Feeling of Stress : Only a little  ?Social Connections: Moderately Integrated  ? Frequency of Communication with Friends and Family: More than three times a week  ? Frequency of Social Gatherings with Friends and Family: More than three times a week  ? Attends Religious Services: More than 4 times per year  ? Active Member of Clubs or Organizations: Yes  ? Attends Archivist Meetings: More than 4 times per year  ? Marital Status: Never married  ?Intimate Partner Violence: Not At Risk  ? Fear of Current or  Ex-Partner: No  ? Emotionally Abused: No  ? Physically Abused: No  ? Sexually Abused: No  ? ? ?Outpatient Medications Prior to Visit  ?Medication Sig Dispense Refill  ? cyclobenzaprine (FLEXERIL) 10 MG tablet Take 1 tablet (10 mg total) by mouth 3 (three) times daily as needed for muscle spasms. 30 tablet 0  ? diclofenac Sodium (VOLTAREN) 1 % GEL See admin instructions.    ? hydroxychloroquine (PLAQUENIL) 200 MG tablet Take 2 tablets by mouth daily.    ? lidocaine (LIDODERM) 5 % Place 1 patch onto the skin daily. Remove & Discard patch within 12 hours or as directed by MD 30 patch 0  ? meloxicam (MOBIC) 15 MG tablet Take 15 mg by mouth daily.    ? ORENCIA CLICKJECT 962 MG/ML SOAJ Inject 1 pen into the skin once a week. Takes on Tuesdays    ? amLODipine (NORVASC) 10 MG tablet Take 10 mg by mouth daily.    ? hydrochlorothiazide (MICROZIDE) 12.5 MG capsule Take 1 capsule (12.5 mg total) by mouth daily. 90 capsule 1  ? labetalol (NORMODYNE) 200 MG tablet SMARTSIG:1 Tablet(s) By Mouth Every 12 Hours    ? ?No facility-administered medications prior to visit.  ? ? ?Allergies  ?Allergen Reactions  ? Lisinopril Swelling  ? ? ?ROS ?Review of Systems  ?Constitutional:  Negative for chills and fever.  ?HENT:  Negative for congestion, sinus pressure and sinus pain.   ?Eyes:  Negative for pain and discharge.  ?Respiratory:  Negative for cough and shortness of breath.   ?Cardiovascular:  Negative for chest pain and palpitations.  ?Gastrointestinal:  Negative for abdominal pain, diarrhea, nausea and vomiting.  ?Endocrine: Negative for polydipsia and polyuria.  ?Genitourinary:  Negative for dysuria and hematuria.  ?Musculoskeletal:  Positive for arthralgias and back pain. Negative for neck pain and neck stiffness.  ?Skin:  Negative for rash.  ?Neurological:  Negative for dizziness and weakness.  ?Psychiatric/Behavioral:  Negative for agitation and behavioral problems.   ? ?  ?Objective:  ?  ?Physical Exam ?Vitals reviewed.   ?Constitutional:   ?   General: She is not in acute distress. ?   Appearance: She is not diaphoretic.  ?HENT:  ?   Head: Normocephalic and atraumatic.  ?   Nose: Nose normal. No congestion.  ?   Mouth/Throat:  ?   Mouth: Mucous membranes are moist.  ?   Pharynx: No posterior oropharyngeal erythema.  ?Eyes:  ?   General: No scleral icterus. ?   Extraocular Movements: Extraocular movements intact.  ?Cardiovascular:  ?   Rate and Rhythm: Normal rate and regular rhythm.  ?   Pulses: Normal pulses.  ?   Heart sounds: Normal  heart sounds. No murmur heard. ?Pulmonary:  ?   Breath sounds: Normal breath sounds. No wheezing or rales.  ?Musculoskeletal:     ?   General: Tenderness (Lumbar spine area and right paraspinal area) present.  ?   Cervical back: Neck supple. No tenderness.  ?   Right lower leg: No edema.  ?   Left lower leg: No edema.  ?   Comments: R shoulder ROM limited due to pain, no point tenderness  ?Skin: ?   General: Skin is warm.  ?   Findings: No rash.  ?Neurological:  ?   General: No focal deficit present.  ?   Mental Status: She is alert and oriented to person, place, and time.  ?Psychiatric:     ?   Mood and Affect: Mood normal.     ?   Behavior: Behavior normal.  ? ? ?BP 118/84 (BP Location: Left Arm, Patient Position: Sitting, Cuff Size: Normal)   Resp 18   Ht $R'5\' 5"'it$  (1.651 m)   Wt 201 lb 3.2 oz (91.3 kg)   BMI 33.48 kg/m?  ?Wt Readings from Last 3 Encounters:  ?07/27/21 201 lb 3.2 oz (91.3 kg)  ?06/17/21 200 lb 12.8 oz (91.1 kg)  ?03/31/21 200 lb (90.7 kg)  ? ? ?Lab Results  ?Component Value Date  ? TSH 1.170 05/26/2021  ? ?Lab Results  ?Component Value Date  ? WBC 4.9 12/28/2020  ? HGB 13.2 12/28/2020  ? HCT 40.8 12/28/2020  ? MCV 78 (L) 12/28/2020  ? PLT 266 12/28/2020  ? ?Lab Results  ?Component Value Date  ? NA 141 12/28/2020  ? K 4.1 12/28/2020  ? CO2 24 12/28/2020  ? GLUCOSE 98 12/28/2020  ? BUN 11 12/28/2020  ? CREATININE 0.66 12/28/2020  ? BILITOT 0.3 12/28/2020  ? ALKPHOS 148 (H)  12/28/2020  ? AST 12 12/28/2020  ? ALT 12 12/28/2020  ? PROT 6.7 12/28/2020  ? ALBUMIN 4.0 12/28/2020  ? CALCIUM 9.0 12/28/2020  ? ANIONGAP 9 05/27/2018  ? EGFR 101 12/28/2020  ? ?Lab Results  ?Component Value Date  ? CHOL 175 0

## 2021-07-27 NOTE — Assessment & Plan Note (Signed)
S/p RAI ablation ?Followed by endocrinology ?

## 2021-07-27 NOTE — Assessment & Plan Note (Signed)
Followed by Rheumatology On Plaquenil and Orencia Needs to follow up with Ophthalmology 

## 2021-07-27 NOTE — Patient Instructions (Addendum)
Please continue taking medications as prescribed. ? ?Please continue to follow low salt diet and ambulate as tolerated. ? ?Please try to look for in network eye doctor to get annual eye exam. ? ?Please get fasting blood tests done before the next visit. ?

## 2021-07-27 NOTE — Assessment & Plan Note (Signed)
BP Readings from Last 1 Encounters:  ?07/27/21 118/84  ? ?Well-controlled with Amlodipine, HCTZ and Labetalol ?Counseled for compliance with the medications ?Advised DASH diet and moderate exercise/walking, at least 150 mins/week ?

## 2021-07-27 NOTE — Assessment & Plan Note (Signed)
Smokes about 5 cigarettes/day  Asked about quitting: confirms that she currently smokes cigarettes Advise to quit smoking: Educated about QUITTING to reduce the risk of cancer, cardio and cerebrovascular disease. Assess willingness: Unwilling to quit at this time, but is working on cutting back. Assist with counseling and pharmacotherapy: Counseled for 5 minutes and literature provided. Arrange for follow up: follow up in 3 months and continue to offer help. 

## 2021-07-28 ENCOUNTER — Encounter: Payer: Self-pay | Admitting: Internal Medicine

## 2021-09-08 ENCOUNTER — Encounter: Payer: Self-pay | Admitting: *Deleted

## 2021-09-16 DIAGNOSIS — E0510 Thyrotoxicosis with toxic single thyroid nodule without thyrotoxic crisis or storm: Secondary | ICD-10-CM | POA: Diagnosis not present

## 2021-09-17 ENCOUNTER — Ambulatory Visit: Payer: Medicaid Other | Admitting: Nurse Practitioner

## 2021-09-17 LAB — T4, FREE: Free T4: 1.19 ng/dL (ref 0.82–1.77)

## 2021-09-17 LAB — T3, FREE: T3, Free: 3.8 pg/mL (ref 2.0–4.4)

## 2021-09-17 LAB — TSH: TSH: 1.55 u[IU]/mL (ref 0.450–4.500)

## 2021-09-23 ENCOUNTER — Ambulatory Visit: Payer: Medicaid Other | Admitting: Nurse Practitioner

## 2021-09-23 ENCOUNTER — Encounter: Payer: Self-pay | Admitting: Nurse Practitioner

## 2021-09-23 VITALS — BP 124/83 | HR 83 | Ht 65.0 in | Wt 204.0 lb

## 2021-09-23 DIAGNOSIS — E890 Postprocedural hypothyroidism: Secondary | ICD-10-CM | POA: Diagnosis not present

## 2021-09-23 DIAGNOSIS — E042 Nontoxic multinodular goiter: Secondary | ICD-10-CM | POA: Diagnosis not present

## 2021-09-23 DIAGNOSIS — E0510 Thyrotoxicosis with toxic single thyroid nodule without thyrotoxic crisis or storm: Secondary | ICD-10-CM

## 2021-09-23 NOTE — Progress Notes (Signed)
09/23/2021     Endocrinology Follow Up Note    Subjective:    Patient ID: Alejandra Sanders, female    DOB: 04-19-1960, PCP Anabel Halon, MD.   Past Medical History:  Diagnosis Date   Arthritis    Calculus of gallbladder with chronic cholecystitis without obstruction    Chronic cholecystitis 06/11/2018   Hypertension    Seizure (HCC)    had seizurea as child; unknow etiology; was on phenobarbital for a few years and then was taken off meds. No seizure in 40 years.   Thyroid disease    Trichimoniasis 02/11/2021   Treated with flagyl 02/11/21    Past Surgical History:  Procedure Laterality Date   CHOLECYSTECTOMY N/A 06/11/2018   Procedure: LAPAROSCOPIC CHOLECYSTECTOMY;  Surgeon: Lucretia Roers, MD;  Location: [facility];  Service: General;  Laterality: N/A;   GALLBLADDER SURGERY  06/11/2018   THYROID SURGERY     removal of thyroid    Social History   Socioeconomic History   Marital status: Single    Spouse name: Not on file   Number of children: Not on file   Years of education: Not on file   Highest education level: Not on file  Occupational History   Not on file  Tobacco Use   Smoking status: Every Day    Packs/day: 0.25    Years: 35.00    Total pack years: 8.75    Types: Cigarettes   Smokeless tobacco: Never   Tobacco comments:    states smokes 3 cig daily only  Vaping Use   Vaping Use: Never used  Substance and Sexual Activity   Alcohol use: No   Drug use: No   Sexual activity: Not Currently    Birth control/protection: Post-menopausal  Other Topics Concern   Not on file  Social History Narrative   Not on file   Social Determinants of Health   Financial Resource Strain: Low Risk  (02/08/2021)   Overall Financial Resource Strain (CARDIA)    Difficulty of Paying Living Expenses: Not hard at all  Food Insecurity: No Food Insecurity (02/08/2021)   Hunger Vital Sign    Worried About Running Out of Food in the Last Year: Never true    Ran Out  of Food in the Last Year: Never true  Transportation Needs: No Transportation Needs (02/08/2021)   PRAPARE - Administrator, Civil Service (Medical): No    Lack of Transportation (Non-Medical): No  Physical Activity: Insufficiently Active (02/08/2021)   Exercise Vital Sign    Days of Exercise per Week: 2 days    Minutes of Exercise per Session: 20 min  Stress: No Stress Concern Present (02/08/2021)   [employer] of Occupational Health - Occupational Stress Questionnaire    Feeling of Stress : Only a little  Social Connections: Moderately Integrated (02/08/2021)   Social Connection and Isolation Panel [NHANES]    Frequency of Communication with Friends and Family: More than three times a week    Frequency of Social Gatherings with Friends and Family: More than three times a week    Attends Religious Services: More than 4 times per year    Active Member of [employer] or Organizations: Yes    Attends Engineer, structural: More than 4 times per year    Marital Status: Never married    Family History  Problem Relation Age of Onset   Aneurysm Maternal Grandmother    Hypertension Father    Hypertension Mother  Diabetes Mother    Heart attack Brother    Hypertension Sister    Kidney failure Sister    Hypertension Sister    Hypertension Sister    Hypertension Son     Outpatient Encounter Medications as of 09/23/2021  Medication Sig   amLODipine (NORVASC) 10 MG tablet Take 1 tablet (10 mg total) by mouth daily.   cyclobenzaprine (FLEXERIL) 10 MG tablet Take 1 tablet (10 mg total) by mouth 3 (three) times daily as needed for muscle spasms.   diclofenac Sodium (VOLTAREN) 1 % GEL See admin instructions.   hydrochlorothiazide (MICROZIDE) 12.5 MG capsule Take 1 capsule (12.5 mg total) by mouth daily.   hydroxychloroquine (PLAQUENIL) 200 MG tablet Take 2 tablets by mouth daily.   labetalol (NORMODYNE) 200 MG tablet Take 1 tablet (200 mg total) by mouth 2 (two) times  daily.   meloxicam (MOBIC) 15 MG tablet Take 15 mg by mouth daily.   ORENCIA CLICKJECT 125 MG/ML SOAJ Inject 1 pen into the skin once a week. Takes on Tuesdays   [DISCONTINUED] lidocaine (LIDODERM) 5 % Place 1 patch onto the skin daily. Remove & Discard patch within 12 hours or as directed by MD   No facility-administered encounter medications on file as of 09/23/2021.    ALLERGIES: Allergies  Allergen Reactions   Lisinopril Swelling    VACCINATION STATUS: Immunization History  Administered Date(s) Administered   Influenza,inj,Quad PF,6+ Mos 12/15/2020   Moderna SARS-COV2 Booster Vaccination 06/14/2019, 02/22/2020   Moderna Sars-Covid-2 Vaccination 05/16/2019     HPI  Alejandra Sanders is 61 y.o. female who presents today with a medical history as above. she is being seen in follow up after being seen in consultation for hyperthyroidism requested by Anabel Halon, MD.  She has a history of partial thyroidectomy as a result of large goiter back in 2001.  She never needed thyroid hormone replacement after her surgery.  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  she denies dysphagia, choking, shortness of breath, no recent voice change.    she does have family history of thyroid dysfunction in her sister and cousin (both requiring surgery 1 with goiter the other with hyperthyroidism), but denies family hx of thyroid cancer.  she is not on any anti-thyroid medications nor on any thyroid hormone supplements. Denies use of Biotin containing supplements.   Review of systems  Constitutional: + Minimally fluctuating body weight,  current Body mass index is 33.95 kg/m. , + fatigue, no subjective hyperthermia, no subjective hypothermia Eyes: no blurry vision, no xerophthalmia ENT: no sore throat, no nodules palpated in throat, no dysphagia/odynophagia, no hoarseness Cardiovascular: no chest pain, no shortness of breath, no palpitations, no leg swelling Respiratory: no cough, no shortness  of breath Gastrointestinal: no nausea/vomiting/diarrhea Musculoskeletal: no muscle/joint aches Skin: no rashes, no hyperemia Neurological: no tremors, no numbness, no tingling, no dizziness Psychiatric: no depression, no anxiety   Objective:    BP 124/83   Pulse 83   Ht 5\' 5"  (1.651 m)   Wt 204 lb (92.5 kg)   BMI 33.95 kg/m   Wt Readings from Last 3 Encounters:  09/23/21 204 lb (92.5 kg)  07/27/21 201 lb 3.2 oz (91.3 kg)  06/17/21 200 lb 12.8 oz (91.1 kg)     BP Readings from Last 3 Encounters:  09/23/21 124/83  07/27/21 118/84  06/17/21 116/75         Physical Exam- Limited  Constitutional:  Body mass index is 33.95 kg/m. , not in  acute distress, normal state of mind Eyes:  EOMI, no exophthalmos Neck: Supple, surgical scar barely visible from partial thyroidectomy Thyroid: No gross goiter Cardiovascular: RRR, no murmurs, rubs, or gallops, no edema Respiratory: Adequate breathing efforts, no crackles, rales, rhonchi, or wheezing Musculoskeletal: no gross deformities, strength intact in all four extremities, no gross restriction of joint movements Skin:  no rashes, no hyperemia Neurological: no tremor with outstretched hands   CMP     Component Value Date/Time   NA 141 12/28/2020 0813   K 4.1 12/28/2020 0813   CL 103 12/28/2020 0813   CO2 24 12/28/2020 0813   GLUCOSE 98 12/28/2020 0813   GLUCOSE 105 (H) 05/27/2018 1528   BUN 11 12/28/2020 0813   CREATININE 0.66 12/28/2020 0813   CALCIUM 9.0 12/28/2020 0813   PROT 6.7 12/28/2020 0813   ALBUMIN 4.0 12/28/2020 0813   AST 12 12/28/2020 0813   ALT 12 12/28/2020 0813   ALKPHOS 148 (H) 12/28/2020 0813   BILITOT 0.3 12/28/2020 0813   GFRNONAA >60 05/27/2018 1528   GFRAA >60 05/27/2018 1528     CBC    Component Value Date/Time   WBC 4.9 12/28/2020 0813   WBC 8.9 06/12/2018 0440   RBC 5.21 12/28/2020 0813   RBC 4.41 06/12/2018 0440   HGB 13.2 12/28/2020 0813   HCT 40.8 12/28/2020 0813   PLT 266  12/28/2020 0813   MCV 78 (L) 12/28/2020 0813   MCH 25.3 (L) 12/28/2020 0813   MCH 25.9 (L) 06/12/2018 0440   MCHC 32.4 12/28/2020 0813   MCHC 31.1 06/12/2018 0440   RDW 13.0 12/28/2020 0813   LYMPHSABS 2.1 12/28/2020 0813   EOSABS 0.2 12/28/2020 0813   BASOSABS 0.0 12/28/2020 0813     Diabetic Labs (most recent): No results found for: "HGBA1C", "MICROALBUR"  Lipid Panel     Component Value Date/Time   CHOL 175 12/28/2020 0813   TRIG 94 12/28/2020 0813   HDL 33 (L) 12/28/2020 0813   CHOLHDL 5.3 (H) 12/28/2020 0813   LDLCALC 125 (H) 12/28/2020 0813   LABVLDL 17 12/28/2020 0813     Lab Results  Component Value Date   TSH 1.550 09/16/2021   TSH 1.170 05/26/2021   TSH 2.120 03/25/2021   TSH 0.048 (L) 01/07/2021   TSH 0.053 (L) 12/28/2020   FREET4 1.19 09/16/2021   FREET4 1.17 05/26/2021   FREET4 1.00 03/25/2021   FREET4 1.32 01/07/2021     US Thyroid 01/08/21 CLINICAL DATA:  61 year old female with a history hyperthyroidism   EXAM: THYROID ULTRASOUND   TECHNIQUE: Ultrasound examination of the thyroid gland and adjacent soft tissues was performed.   COMPARISON:  None.   FINDINGS: Parenchymal Echotexture: Markedly heterogenous   Isthmus: 0.8 cm   Right lobe: 4.8 cm x 2.4 cm x 2.5 cm   Left lobe: 5.9 cm x 3.3 cm x 4.4 cm   _________________________________________________________   Estimated total number of nodules >/= 1 cm: 6-10   Number of spongiform nodules >/=  2 cm not described below (TR1): 0   Number of mixed cystic and solid nodules >/= 1.5 cm not described below (TR2): 0   _________________________________________________________   Nodule # 1:   Location: Isthmus; mid   Maximum size: 1.3 cm; Other 2 dimensions: 0.9 cm x 0.7 cm   Composition: cannot determine (2)   Echogenicity: isoechoic (1)   Shape: not taller-than-wide (0)   Margins: ill-defined (0)   Echogenic foci: none (0)   ACR TI-RADS total points:  3.   ACR TI-RADS risk  category: TR3 (3 points).   ACR TI-RADS recommendations:   Nodule does not meet criteria for surveillance or biopsy   _________________________________________________________   Nodule # 2:   Location: Right; Superior   Maximum size: 3.0 cm; Other 2 dimensions: 2.1 cm x 2.0 cm   Composition: solid/almost completely solid (2)   Echogenicity: isoechoic (1)   Shape: not taller-than-wide (0)   Margins: ill-defined (0)   Echogenic foci: none (0)   ACR TI-RADS total points: 3.   ACR TI-RADS risk category: TR3 (3 points).   ACR TI-RADS recommendations:   Nodule meets criteria for biopsy   _________________________________________________________   Nodule # 3:   Location: Right; Mid   Maximum size: 2.0 cm; Other 2 dimensions: 1.9 cm x 1.5 cm   Composition: cannot determine (2)   Echogenicity: isoechoic (1)   Shape: not taller-than-wide (0)   Margins: ill-defined (0)   Echogenic foci: none (0)   ACR TI-RADS total points: 3.   ACR TI-RADS risk category: TR3 (3 points).   ACR TI-RADS recommendations:   Nodule meets criteria for surveillance   _________________________________________________________   Nodule # 4:   Location: Left; Superior   Maximum size: 1.7 cm; Other 2 dimensions: 1.7 cm x 1.5 cm   Composition: solid/almost completely solid (2)   Echogenicity: isoechoic (1)   Shape: not taller-than-wide (0)   Margins: smooth (0)   Echogenic foci: none (0)   ACR TI-RADS total points: 3.   ACR TI-RADS risk category: TR3 (3 points).   ACR TI-RADS recommendations:   Nodule meets criteria for surveillance   _________________________________________________________   Nodule # 5:   Location: Left; Mid   Maximum size: 2.3 cm; Other 2 dimensions: 2.0 cm x 1.8 cm   Composition: solid/almost completely solid (2)   Echogenicity: hyperechoic (1)   Shape: not taller-than-wide (0)   Margins: ill-defined (0)   Echogenic foci: none (0)   ACR  TI-RADS total points: 3.   ACR TI-RADS risk category: TR3 (3 points).   ACR TI-RADS recommendations:   Nodule meets criteria for surveillance   _________________________________________________________   Nodule # 6:   Location: Left; Mid   Maximum size: 1.8 cm; Other 2 dimensions: 1.5 cm x 1.4 cm   Composition: mixed cystic and solid (1)   Echogenicity: isoechoic (1)   Shape: not taller-than-wide (0)   Margins: ill-defined (0)   Echogenic foci: none (0)   ACR TI-RADS total points: 2.   ACR TI-RADS risk category: TR2 (2 points).   ACR TI-RADS recommendations:   Cystic nodule does not meet criteria for surveillance or biopsy   _________________________________________________________   Nodule # 7:   Location: Left; Inferior   Maximum size: 2.4 cm; Other 2 dimensions: 2.4 cm x 2.1 cm   Composition: solid/almost completely solid (2)   Echogenicity: isoechoic (1)   Shape: not taller-than-wide (0)   Margins: ill-defined (0)   Echogenic foci: none (0)   ACR TI-RADS total points: 3.   ACR TI-RADS risk category: TR3 (3 points).   ACR TI-RADS recommendations:   Nodule meets criteria for surveillance   _________________________________________________________   No adenopathy   IMPRESSION: Multinodular thyroid.   Right superior thyroid nodule (labeled 2, 3.0 cm, TR 3) meets criteria for biopsy, as designated by the newly established ACR TI-RADS criteria, and referral for biopsy is recommended.   Right inferior thyroid nodule (labeled 3), left superior thyroid nodules (labeled 4 and labeled 5), and left inferior  thyroid nodule (labeled 7) all meet criteria for surveillance, as designated by the newly established ACR TI-RADS criteria. Surveillance ultrasound study recommended to be performed annually up to 5 years.   Recommendations follow those established by the new ACR TI-RADS criteria (J Am Coll Radiol 2017;14:587-595).     Electronically  Signed   By: Gilmer MorJaime  Wagner D.O.   On: 01/08/2021 14:12 --------------------------------------------------------------------------------------------------------------  Uptake and scan 01/26/21 CLINICAL DATA:  Suppressed TSH, prior partial thyroidectomy, multinodular goiter question toxic nodule, hyperthyroidism   EXAM: THYROID SCAN AND UPTAKE - 4 AND 24 HOURS   TECHNIQUE: Following oral administration of I-123 capsule, anterior planar imaging was acquired at 24 hours. Thyroid uptake was calculated with a thyroid probe at 4-6 hours and 24 hours.   RADIOPHARMACEUTICALS:  315 uCi I-123 sodium iodide p.o.   COMPARISON:  Thyroid ultrasound 01/08/2021   FINDINGS: Focal hot nodule in the inferior pole of LEFT thyroid lobe consistent with toxic adenoma.   This corresponds to a 2.5 cm diameter nodule on ultrasound at the inferior pole.   Suppression of uptake in remaining thyroid lobes.   No additional warm nodules.   Subtle areas of decreased tracer uptake, may represent suppression or subtle nodules.   4 hour I-123 uptake = 6.3 % (normal 5-20%)   24 hour I-123 uptake = 14.8% (normal 10-30%)   IMPRESSION: Normal 4 hour and 24 hour radio iodine uptakes.   Hot nodule inferior pole LEFT thyroid lobe corresponding to a 2.5 cm diameter nodule seen on prior ultrasound consistent with toxic adenoma.   Suppression of uptake in remaining thyroid tissue.     Electronically Signed   By: Ulyses SouthwardMark  Boles M.D.   On: 01/27/2021 16:01   Latest Reference Range & Units 12/28/20 08:13 01/07/21 10:53 03/25/21 08:41 05/26/21 08:30 09/16/21 08:57  TSH 0.450 - 4.500 uIU/mL 0.053 (L) 0.048 (L) 2.120 1.170 1.550  Triiodothyronine,Free,Serum 2.0 - 4.4 pg/mL  3.9 3.1 3.1 3.8  T4,Free(Direct) 0.82 - 1.77 ng/dL  1.611.32 0.961.00 0.451.17 4.091.19  Thyroperoxidase Ab SerPl-aCnc 0 - 34 IU/mL  10     Thyroglobulin Antibody 0.0 - 0.9 IU/mL  <1.0     (L): Data is abnormally low  Assessment & Plan:   1.  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  Her uptake and scan showed hot nodule in left inferior pole, consistent with toxic thyroid nodule.   -She had RAI ablation on 02/11/21.    -Her repeat thyroid function tests show -euthyroid.  There is a possibility that she will not need thyroid hormone replacement in the future.  Will repeat thyroid function tests in 4 months for surveillance.   -Patient is advised to maintain close follow up with Anabel HalonPatel, Rutwik K, MD for primary care needs.     I spent 18 minutes in the care of the patient today including review of labs from Thyroid Function, CMP, and other relevant labs ; imaging/biopsy records (current and previous including abstractions from other facilities); face-to-face time discussing  her lab results and symptoms, medications doses, her options of short and long term treatment based on the latest standards of care / guidelines;   and documenting the encounter.  Malachi BondsGloria Sanders  participated in the discussions, expressed understanding, and voiced agreement with the above plans.  All questions were answered to her satisfaction. she is encouraged to contact clinic should she have any questions or concerns prior  to her return visit.    Follow up plan: Return in about 4 months (around 01/23/2022) for Thyroid follow up, Previsit labs.   Thank you for involving me in the care of this pleasant patient, and I will continue to update you with her progress.   Rayetta Pigg, [facility] [address] [place], [place] [address] Phone: [phone] Fax: [phone]  09/23/2021, 9:50 AM

## 2021-10-13 ENCOUNTER — Ambulatory Visit: Payer: Medicaid Other

## 2022-01-19 DIAGNOSIS — M0579 Rheumatoid arthritis with rheumatoid factor of multiple sites without organ or systems involvement: Secondary | ICD-10-CM | POA: Diagnosis not present

## 2022-01-19 DIAGNOSIS — E0510 Thyrotoxicosis with toxic single thyroid nodule without thyrotoxic crisis or storm: Secondary | ICD-10-CM | POA: Diagnosis not present

## 2022-01-19 DIAGNOSIS — R5383 Other fatigue: Secondary | ICD-10-CM | POA: Diagnosis not present

## 2022-01-22 LAB — TSH: TSH: 0.888 u[IU]/mL (ref 0.450–4.500)

## 2022-01-22 LAB — T4, FREE: Free T4: 1.32 ng/dL (ref 0.82–1.77)

## 2022-01-22 LAB — T3, FREE: T3, Free: 3.3 pg/mL (ref 2.0–4.4)

## 2022-01-24 ENCOUNTER — Ambulatory Visit: Payer: Medicaid Other | Admitting: Nurse Practitioner

## 2022-01-24 ENCOUNTER — Encounter: Payer: Self-pay | Admitting: Nurse Practitioner

## 2022-01-24 VITALS — BP 123/73 | HR 92 | Ht 65.0 in | Wt 205.2 lb

## 2022-01-24 DIAGNOSIS — E0510 Thyrotoxicosis with toxic single thyroid nodule without thyrotoxic crisis or storm: Secondary | ICD-10-CM

## 2022-01-24 DIAGNOSIS — E042 Nontoxic multinodular goiter: Secondary | ICD-10-CM | POA: Diagnosis not present

## 2022-01-24 DIAGNOSIS — E890 Postprocedural hypothyroidism: Secondary | ICD-10-CM | POA: Diagnosis not present

## 2022-01-24 NOTE — Progress Notes (Signed)
01/24/2022     Endocrinology Follow Up Note    Subjective:    Patient ID: Alejandra Sanders, female    DOB: [birth year], PCP Anabel Halon, MD.   Past Medical History:  Diagnosis Date   Arthritis    Calculus of gallbladder with chronic cholecystitis without obstruction    Chronic cholecystitis 06/11/2018   Hypertension    Seizure (HCC)    had seizurea as child; unknow etiology; was on phenobarbital for a few years and then was taken off meds. No seizure in 40 years.   Thyroid disease    Trichimoniasis 02/11/2021   Treated with flagyl 02/11/21    Past Surgical History:  Procedure Laterality Date   CHOLECYSTECTOMY N/A 06/11/2018   Procedure: LAPAROSCOPIC CHOLECYSTECTOMY;  Surgeon: Lucretia Roers, MD;  Location: [facility];  Service: General;  Laterality: N/A;   GALLBLADDER SURGERY  06/11/2018   THYROID SURGERY     removal of thyroid    Social History   Socioeconomic History   Marital status: Single    Spouse name: Not on file   Number of children: Not on file   Years of education: Not on file   Highest education level: Not on file  Occupational History   Not on file  Tobacco Use   Smoking status: Every Day    Packs/day: 0.25    Years: 35.00    Total pack years: 8.75    Types: Cigarettes   Smokeless tobacco: Never   Tobacco comments:    states smokes 3 cig daily only  Vaping Use   Vaping Use: Never used  Substance and Sexual Activity   Alcohol use: No   Drug use: No   Sexual activity: Not Currently    Birth control/protection: Post-menopausal  Other Topics Concern   Not on file  Social History Narrative   Not on file   Social Determinants of Health   Financial Resource Strain: Low Risk  (02/08/2021)   Overall Financial Resource Strain (CARDIA)    Difficulty of Paying Living Expenses: Not hard at all  Food Insecurity: No Food Insecurity (02/08/2021)   Hunger Vital Sign    Worried About Running Out of Food in the Last Year: Never true    Ran Out  of Food in the Last Year: Never true  Transportation Needs: No Transportation Needs (02/08/2021)   PRAPARE - Administrator, Civil Service (Medical): No    Lack of Transportation (Non-Medical): No  Physical Activity: Insufficiently Active (02/08/2021)   Exercise Vital Sign    Days of Exercise per Week: 2 days    Minutes of Exercise per Session: 20 min  Stress: No Stress Concern Present (02/08/2021)   [employer] of Occupational Health - Occupational Stress Questionnaire    Feeling of Stress : Only a little  Social Connections: Moderately Integrated (02/08/2021)   Social Connection and Isolation Panel [NHANES]    Frequency of Communication with Friends and Family: More than three times a week    Frequency of Social Gatherings with Friends and Family: More than three times a week    Attends Religious Services: More than 4 times per year    Active Member of [employer] or Organizations: Yes    Attends Engineer, structural: More than 4 times per year    Marital Status: Never married    Family History  Problem Relation Age of Onset   Aneurysm Maternal Grandmother    Hypertension Father    Hypertension Mother  Diabetes Mother    Heart attack Brother    Hypertension Sister    Kidney failure Sister    Hypertension Sister    Hypertension Sister    Hypertension Son     Outpatient Encounter Medications as of 01/24/2022  Medication Sig   amLODipine (NORVASC) 10 MG tablet Take 1 tablet (10 mg total) by mouth daily.   cyclobenzaprine (FLEXERIL) 10 MG tablet Take 1 tablet (10 mg total) by mouth 3 (three) times daily as needed for muscle spasms.   diclofenac Sodium (VOLTAREN) 1 % GEL See admin instructions.   hydrochlorothiazide (MICROZIDE) 12.5 MG capsule Take 1 capsule (12.5 mg total) by mouth daily.   hydroxychloroquine (PLAQUENIL) 200 MG tablet Take 2 tablets by mouth daily.   labetalol (NORMODYNE) 200 MG tablet Take 1 tablet (200 mg total) by mouth 2 (two) times  daily.   meloxicam (MOBIC) 15 MG tablet Take 15 mg by mouth daily.   ORENCIA CLICKJECT 125 MG/ML SOAJ Inject 1 pen into the skin once a week. Takes on Tuesdays   No facility-administered encounter medications on file as of 01/24/2022.    ALLERGIES: Allergies  Allergen Reactions   Lisinopril Swelling    VACCINATION STATUS: Immunization History  Administered Date(s) Administered   Influenza,inj,Quad PF,6+ Mos 12/15/2020   Moderna SARS-COV2 Booster Vaccination 06/14/2019, 02/22/2020   Moderna Sars-Covid-2 Vaccination 05/16/2019     HPI  Alejandra Sanders is 61 y.o. female who presents today with a medical history as above. she is being seen in follow up after being seen in consultation for hyperthyroidism requested by Anabel Halon, MD.  She has a history of partial thyroidectomy as a result of large goiter back in 2001.  She never needed thyroid hormone replacement after her surgery.  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  she denies dysphagia, choking, shortness of breath, no recent voice change.    she does have family history of thyroid dysfunction in her sister and cousin (both requiring surgery 1 with goiter the other with hyperthyroidism), but denies family hx of thyroid cancer.  she is not on any anti-thyroid medications nor on any thyroid hormone supplements. Denies use of Biotin containing supplements.   Review of systems  Constitutional: + Minimally fluctuating body weight,  current Body mass index is 34.15 kg/m. , + fatigue, no subjective hyperthermia, no subjective hypothermia Eyes: no blurry vision, no xerophthalmia ENT: no sore throat, no nodules palpated in throat, no dysphagia/odynophagia, no hoarseness, + recent nasal congestion Cardiovascular: no chest pain, no shortness of breath, no palpitations, no leg swelling Respiratory: no cough, no shortness of breath Gastrointestinal: no nausea/vomiting/diarrhea Musculoskeletal: no muscle/joint aches Skin: no  rashes, no hyperemia Neurological: no tremors, no numbness, no tingling, no dizziness Psychiatric: no depression, no anxiety   Objective:    BP 123/73 (BP Location: Left Arm, Patient Position: Sitting, Cuff Size: Large)   Pulse 92   Ht  (1.651 m)   Wt 205 lb 3.2 oz (93.1 kg)   BMI 34.15 kg/m   Wt Readings from Last 3 Encounters:  01/24/22 205 lb 3.2 oz (93.1 kg)  09/23/21 204 lb (92.5 kg)  07/27/21 201 lb 3.2 oz (91.3 kg)     BP Readings from Last 3 Encounters:  01/24/22 123/73  09/23/21 124/83  07/27/21 118/84         Physical Exam- Limited  Constitutional:  Body mass index is 34.15 kg/m. , not in acute distress, normal state of mind Eyes:  EOMI, no  exophthalmos Neck: Supple, surgical scar barely visible from partial thyroidectomy Thyroid: No gross goiter Cardiovascular: RRR, no murmurs, rubs, or gallops, no edema Respiratory: Adequate breathing efforts, no crackles, rales, rhonchi, or wheezing Musculoskeletal: no gross deformities, strength intact in all four extremities, no gross restriction of joint movements Skin:  no rashes, no hyperemia Neurological: no tremor with outstretched hands   CMP     Component Value Date/Time   NA 141 12/28/2020 0813   K 4.1 12/28/2020 0813   CL 103 12/28/2020 0813   CO2 24 12/28/2020 0813   GLUCOSE 98 12/28/2020 0813   GLUCOSE 105 (H) 05/27/2018 1528   BUN 11 12/28/2020 0813   CREATININE 0.66 12/28/2020 0813   CALCIUM 9.0 12/28/2020 0813   PROT 6.7 12/28/2020 0813   ALBUMIN 4.0 12/28/2020 0813   AST 12 12/28/2020 0813   ALT 12 12/28/2020 0813   ALKPHOS 148 (H) 12/28/2020 0813   BILITOT 0.3 12/28/2020 0813   GFRNONAA >60 05/27/2018 1528   GFRAA >60 05/27/2018 1528     CBC    Component Value Date/Time   WBC 4.9 12/28/2020 0813   WBC 8.9 06/12/2018 0440   RBC 5.21 12/28/2020 0813   RBC 4.41 06/12/2018 0440   HGB 13.2 12/28/2020 0813   HCT 40.8 12/28/2020 0813   PLT 266 12/28/2020 0813   MCV 78 (L) 12/28/2020  0813   MCH 25.3 (L) 12/28/2020 0813   MCH 25.9 (L) 06/12/2018 0440   MCHC 32.4 12/28/2020 0813   MCHC 31.1 06/12/2018 0440   RDW 13.0 12/28/2020 0813   LYMPHSABS 2.1 12/28/2020 0813   EOSABS 0.2 12/28/2020 0813   BASOSABS 0.0 12/28/2020 0813     Diabetic Labs (most recent): No results found for: "HGBA1C", "MICROALBUR"  Lipid Panel     Component Value Date/Time   CHOL 175 12/28/2020 0813   TRIG 94 12/28/2020 0813   HDL 33 (L) 12/28/2020 0813   CHOLHDL 5.3 (H) 12/28/2020 0813   LDLCALC 125 (H) 12/28/2020 0813   LABVLDL 17 12/28/2020 0813     Lab Results  Component Value Date   TSH 0.888 01/19/2022   TSH 1.550 09/16/2021   TSH 1.170 05/26/2021   TSH 2.120 03/25/2021   TSH 0.048 (L) 01/07/2021   TSH 0.053 (L) 12/28/2020   FREET4 1.32 01/19/2022   FREET4 1.19 09/16/2021   FREET4 1.17 05/26/2021   FREET4 1.00 03/25/2021   FREET4 1.32 01/07/2021     US Thyroid 01/08/21 CLINICAL DATA:  61 year old female with a history hyperthyroidism   EXAM: THYROID ULTRASOUND   TECHNIQUE: Ultrasound examination of the thyroid gland and adjacent soft tissues was performed.   COMPARISON:  None.   FINDINGS: Parenchymal Echotexture: Markedly heterogenous   Isthmus: 0.8 cm   Right lobe: 4.8 cm x 2.4 cm x 2.5 cm   Left lobe: 5.9 cm x 3.3 cm x 4.4 cm   _________________________________________________________   Estimated total number of nodules >/= 1 cm: 6-10   Number of spongiform nodules >/=  2 cm not described below (TR1): 0   Number of mixed cystic and solid nodules >/= 1.5 cm not described below (TR2): 0   _________________________________________________________   Nodule # 1:   Location: Isthmus; mid   Maximum size: 1.3 cm; Other 2 dimensions: 0.9 cm x 0.7 cm   Composition: cannot determine (2)   Echogenicity: isoechoic (1)   Shape: not taller-than-wide (0)   Margins: ill-defined (0)   Echogenic foci: none (0)   ACR TI-RADS total points:  3.   ACR  TI-RADS risk category: TR3 (3 points).   ACR TI-RADS recommendations:   Nodule does not meet criteria for surveillance or biopsy   _________________________________________________________   Nodule # 2:   Location: Right; Superior   Maximum size: 3.0 cm; Other 2 dimensions: 2.1 cm x 2.0 cm   Composition: solid/almost completely solid (2)   Echogenicity: isoechoic (1)   Shape: not taller-than-wide (0)   Margins: ill-defined (0)   Echogenic foci: none (0)   ACR TI-RADS total points: 3.   ACR TI-RADS risk category: TR3 (3 points).   ACR TI-RADS recommendations:   Nodule meets criteria for biopsy   _________________________________________________________   Nodule # 3:   Location: Right; Mid   Maximum size: 2.0 cm; Other 2 dimensions: 1.9 cm x 1.5 cm   Composition: cannot determine (2)   Echogenicity: isoechoic (1)   Shape: not taller-than-wide (0)   Margins: ill-defined (0)   Echogenic foci: none (0)   ACR TI-RADS total points: 3.   ACR TI-RADS risk category: TR3 (3 points).   ACR TI-RADS recommendations:   Nodule meets criteria for surveillance   _________________________________________________________   Nodule # 4:   Location: Left; Superior   Maximum size: 1.7 cm; Other 2 dimensions: 1.7 cm x 1.5 cm   Composition: solid/almost completely solid (2)   Echogenicity: isoechoic (1)   Shape: not taller-than-wide (0)   Margins: smooth (0)   Echogenic foci: none (0)   ACR TI-RADS total points: 3.   ACR TI-RADS risk category: TR3 (3 points).   ACR TI-RADS recommendations:   Nodule meets criteria for surveillance   _________________________________________________________   Nodule # 5:   Location: Left; Mid   Maximum size: 2.3 cm; Other 2 dimensions: 2.0 cm x 1.8 cm   Composition: solid/almost completely solid (2)   Echogenicity: hyperechoic (1)   Shape: not taller-than-wide (0)   Margins: ill-defined (0)   Echogenic foci:  none (0)   ACR TI-RADS total points: 3.   ACR TI-RADS risk category: TR3 (3 points).   ACR TI-RADS recommendations:   Nodule meets criteria for surveillance   _________________________________________________________   Nodule # 6:   Location: Left; Mid   Maximum size: 1.8 cm; Other 2 dimensions: 1.5 cm x 1.4 cm   Composition: mixed cystic and solid (1)   Echogenicity: isoechoic (1)   Shape: not taller-than-wide (0)   Margins: ill-defined (0)   Echogenic foci: none (0)   ACR TI-RADS total points: 2.   ACR TI-RADS risk category: TR2 (2 points).   ACR TI-RADS recommendations:   Cystic nodule does not meet criteria for surveillance or biopsy   _________________________________________________________   Nodule # 7:   Location: Left; Inferior   Maximum size: 2.4 cm; Other 2 dimensions: 2.4 cm x 2.1 cm   Composition: solid/almost completely solid (2)   Echogenicity: isoechoic (1)   Shape: not taller-than-wide (0)   Margins: ill-defined (0)   Echogenic foci: none (0)   ACR TI-RADS total points: 3.   ACR TI-RADS risk category: TR3 (3 points).   ACR TI-RADS recommendations:   Nodule meets criteria for surveillance   _________________________________________________________   No adenopathy   IMPRESSION: Multinodular thyroid.   Right superior thyroid nodule (labeled 2, 3.0 cm, TR 3) meets criteria for biopsy, as designated by the newly established ACR TI-RADS criteria, and referral for biopsy is recommended.   Right inferior thyroid nodule (labeled 3), left superior thyroid nodules (labeled 4 and labeled 5), and left inferior  thyroid nodule (labeled 7) all meet criteria for surveillance, as designated by the newly established ACR TI-RADS criteria. Surveillance ultrasound study recommended to be performed annually up to 5 years.   Recommendations follow those established by the new ACR TI-RADS criteria (J Am Coll Radiol 2017;14:587-595).      Electronically Signed   By: Gilmer Mor D.O.   On: 01/08/2021 14:12 --------------------------------------------------------------------------------------------------------------  Uptake and scan 01/26/21 CLINICAL DATA:  Suppressed TSH, prior partial thyroidectomy, multinodular goiter question toxic nodule, hyperthyroidism   EXAM: THYROID SCAN AND UPTAKE - 4 AND 24 HOURS   TECHNIQUE: Following oral administration of I-123 capsule, anterior planar imaging was acquired at 24 hours. Thyroid uptake was calculated with a thyroid probe at 4-6 hours and 24 hours.   RADIOPHARMACEUTICALS:  315 uCi I-123 sodium iodide p.o.   COMPARISON:  Thyroid ultrasound 01/08/2021   FINDINGS: Focal hot nodule in the inferior pole of LEFT thyroid lobe consistent with toxic adenoma.   This corresponds to a 2.5 cm diameter nodule on ultrasound at the inferior pole.   Suppression of uptake in remaining thyroid lobes.   No additional warm nodules.   Subtle areas of decreased tracer uptake, may represent suppression or subtle nodules.   4 hour I-123 uptake = 6.3 % (normal 5-20%)   24 hour I-123 uptake = 14.8% (normal 10-30%)   IMPRESSION: Normal 4 hour and 24 hour radio iodine uptakes.   Hot nodule inferior pole LEFT thyroid lobe corresponding to a 2.5 cm diameter nodule seen on prior ultrasound consistent with toxic adenoma.   Suppression of uptake in remaining thyroid tissue.     Electronically Signed   By: Ulyses Southward M.D.   On: 01/27/2021 16:01   Latest Reference Range & Units 01/07/21 10:53 03/25/21 08:41 05/26/21 08:30 09/16/21 08:57 01/19/22 09:15  TSH 0.450 - 4.500 uIU/mL 0.048 (L) 2.120 1.170 1.550 0.888  Triiodothyronine,Free,Serum 2.0 - 4.4 pg/mL 3.9 3.1 3.1 3.8 3.3  T4,Free(Direct) 0.82 - 1.77 ng/dL 1.61 0.96 0.45 4.09 8.11  Thyroperoxidase Ab SerPl-aCnc 0 - 34 IU/mL 10      Thyroglobulin Antibody 0.0 - 0.9 IU/mL <1.0      (L): Data is abnormally low  Assessment & Plan:    1. 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  Her uptake and scan showed hot nodule in left inferior pole, consistent with toxic thyroid nodule.   -She had RAI ablation on 02/11/21.   -Her repeat thyroid function tests show -euthyroid presentation.  She does not need thyroid hormone supplement or antithyroid medications at this time. There is a possibility that she will not need thyroid hormone replacement in the future.  Will repeat thyroid function tests in 6 months for surveillance.   -Patient is advised to maintain close follow up with Anabel Halon, MD for primary care needs.    I spent 22 minutes in the care of the patient today including review of labs from Thyroid Function, CMP, and other relevant labs ; imaging/biopsy records (current and previous including abstractions from other facilities); face-to-face time discussing  her lab results and symptoms, medications doses, her options of short and long term treatment based on the latest standards of care / guidelines;   and documenting the encounter.  Alejandra Sanders  participated in the discussions, expressed understanding, and voiced agreement with the above plans.  All questions were answered to her satisfaction. she is encouraged  to contact clinic should she have any questions or concerns prior to her return visit.    Follow up plan: Return in about 6 months (around 07/26/2022) for Thyroid follow up, Previsit labs.   Thank you for involving me in the care of this pleasant patient, and I will continue to update you with her progress.  Rayetta Pigg, [facility] [address] [place], [place] [address] Phone: [phone] Fax: [phone]  01/24/2022, 10:34 AM

## 2022-01-27 ENCOUNTER — Encounter: Payer: Self-pay | Admitting: Internal Medicine

## 2022-01-27 ENCOUNTER — Ambulatory Visit (INDEPENDENT_AMBULATORY_CARE_PROVIDER_SITE_OTHER): Payer: Medicaid Other | Admitting: Internal Medicine

## 2022-01-27 ENCOUNTER — Encounter: Payer: Self-pay | Admitting: *Deleted

## 2022-01-27 VITALS — BP 136/84 | HR 96 | Resp 18 | Ht 65.0 in | Wt 205.2 lb

## 2022-01-27 DIAGNOSIS — R5382 Chronic fatigue, unspecified: Secondary | ICD-10-CM

## 2022-01-27 DIAGNOSIS — I10 Essential (primary) hypertension: Secondary | ICD-10-CM

## 2022-01-27 DIAGNOSIS — Z1159 Encounter for screening for other viral diseases: Secondary | ICD-10-CM

## 2022-01-27 DIAGNOSIS — R7303 Prediabetes: Secondary | ICD-10-CM

## 2022-01-27 DIAGNOSIS — Z23 Encounter for immunization: Secondary | ICD-10-CM | POA: Diagnosis not present

## 2022-01-27 DIAGNOSIS — M069 Rheumatoid arthritis, unspecified: Secondary | ICD-10-CM | POA: Diagnosis not present

## 2022-01-27 DIAGNOSIS — E559 Vitamin D deficiency, unspecified: Secondary | ICD-10-CM

## 2022-01-27 DIAGNOSIS — E782 Mixed hyperlipidemia: Secondary | ICD-10-CM | POA: Diagnosis not present

## 2022-01-27 DIAGNOSIS — Z1211 Encounter for screening for malignant neoplasm of colon: Secondary | ICD-10-CM | POA: Diagnosis not present

## 2022-01-27 DIAGNOSIS — Z0001 Encounter for general adult medical examination with abnormal findings: Secondary | ICD-10-CM | POA: Insufficient documentation

## 2022-01-27 MED ORDER — HYDROCHLOROTHIAZIDE 12.5 MG PO CAPS: 12.5000 mg | ORAL_CAPSULE | Freq: Every day | ORAL | 1 refills | Status: DC

## 2022-01-27 MED ORDER — LABETALOL HCL 200 MG PO TABS: 200.0000 mg | ORAL_TABLET | Freq: Two times a day (BID) | ORAL | 1 refills | Status: DC

## 2022-01-27 MED ORDER — AMLODIPINE BESYLATE 10 MG PO TABS: 10.0000 mg | ORAL_TABLET | Freq: Every day | ORAL | 1 refills | Status: DC

## 2022-01-27 NOTE — Assessment & Plan Note (Addendum)
Could be due to multiple factors including RA, sleep disturbance, caregiver stress and beta-blocker Check TSH, vitamin D Review CBC and CMP from rheumatology office Needs to get colonoscopy

## 2022-01-27 NOTE — Patient Instructions (Signed)
Please continue taking medications as prescribed.  Please continue to follow low carb diet and perform moderate exercise/walking at least 150 mins/week.  Please consider getting Shingrix vaccine.

## 2022-01-27 NOTE — Assessment & Plan Note (Signed)

## 2022-01-27 NOTE — Progress Notes (Signed)
Established Patient Office Visit  Subjective:  Patient ID: Alejandra Sanders, female    DOB: [birth year]  Age: 61 y.o. MRN: [id number]  CC:  Chief Complaint  Patient presents with   Annual Exam    Annual exam pt gets really tired easily     HPI Alejandra Sanders is a 61 y.o. female with past medical history of HTN, RA, toxic thyroid nodule and tobacco abuse who presents for annual physical.  She complains of chronic fatigue.  Of note, she has history of RA and has chronic arthralgias.  She is also responsible for providing care to her parents.  She reports that she has to wake up multiple times at nighttime to look after her father.  She denies any fever, chills, night sweats, cough/hemoptysis or LAD.  She recently had CBC and CMP done at rheumatology office.  HTN: Her BP was borderline elevated today.  Of note, she has run out of her HCTZ.  She reports taking amlodipine and labetalol regularly.  She denies any headache, dizziness, chest pain, dyspnea or palpitations.  Past Medical History:  Diagnosis Date   Arthritis    Calculus of gallbladder with chronic cholecystitis without obstruction    Chronic cholecystitis 06/11/2018   Hypertension    Seizure ([place])    had seizurea as child; unknow etiology; was on phenobarbital for a few years and then was taken off meds. No seizure in 40 years.   Thyroid disease    Trichimoniasis 02/11/2021   Treated with flagyl 02/11/21    Past Surgical History:  Procedure Laterality Date   CHOLECYSTECTOMY N/A 06/11/2018   Procedure: LAPAROSCOPIC CHOLECYSTECTOMY;  Surgeon: Virl Cagey, MD;  Location: [facility];  Service: General;  Laterality: N/A;   GALLBLADDER SURGERY  06/11/2018   THYROID SURGERY     removal of thyroid    Family History  Problem Relation Age of Onset   Aneurysm Maternal Grandmother    Hypertension Father    Hypertension Mother    Diabetes Mother    Heart attack Brother    Hypertension Sister    Kidney failure Sister     Hypertension Sister    Hypertension Sister    Hypertension Son     Social History   Socioeconomic History   Marital status: Single    Spouse name: Not on file   Number of children: Not on file   Years of education: Not on file   Highest education level: Not on file  Occupational History   Not on file  Tobacco Use   Smoking status: Every Day    Packs/day: 0.25    Years: 35.00    Total pack years: 8.75    Types: Cigarettes   Smokeless tobacco: Never   Tobacco comments:    states smokes 3 cig daily only  Vaping Use   Vaping Use: Never used  Substance and Sexual Activity   Alcohol use: No   Drug use: No   Sexual activity: Not Currently    Birth control/protection: Post-menopausal  Other Topics Concern   Not on file  Social History Narrative   Not on file   Social Determinants of Health   Financial Resource Strain: Low Risk  (02/08/2021)   Overall Financial Resource Strain (CARDIA)    Difficulty of Paying Living Expenses: Not hard at all  Food Insecurity: No Food Insecurity (02/08/2021)   Hunger Vital Sign    Worried About Running Out of Food in the Last Year: Never true    Ran  Out of Food in the Last Year: Never true  Transportation Needs: No Transportation Needs (02/08/2021)   PRAPARE - Hydrologist (Medical): No    Lack of Transportation (Non-Medical): No  Physical Activity: Insufficiently Active (02/08/2021)   Exercise Vital Sign    Days of Exercise per Week: 2 days    Minutes of Exercise per Session: 20 min  Stress: No Stress Concern Present (02/08/2021)   [place]    Feeling of Stress : Only a little  Social Connections: Moderately Integrated (02/08/2021)   Social Connection and Isolation Panel [NHANES]    Frequency of Communication with Friends and Family: More than three times a week    Frequency of Social Gatherings with Friends and Family: More than three times a  week    Attends Religious Services: More than 4 times per year    Active Member of [employer] or Organizations: Yes    Attends Archivist Meetings: More than 4 times per year    Marital Status: Never married  Intimate Partner Violence: Not At Risk (02/08/2021)   Humiliation, Afraid, Rape, and Kick questionnaire    Fear of Current or Ex-Partner: No    Emotionally Abused: No    Physically Abused: No    Sexually Abused: No    Outpatient Medications Prior to Visit  Medication Sig Dispense Refill   cyclobenzaprine (FLEXERIL) 10 MG tablet Take 1 tablet (10 mg total) by mouth 3 (three) times daily as needed for muscle spasms. 30 tablet 0   diclofenac Sodium (VOLTAREN) 1 % GEL See admin instructions.     hydroxychloroquine (PLAQUENIL) 200 MG tablet Take 2 tablets by mouth daily.     meloxicam (MOBIC) 15 MG tablet Take 15 mg by mouth daily.     ORENCIA CLICKJECT 450 MG/ML SOAJ Inject 1 pen into the skin once a week. Takes on Tuesdays     amLODipine (NORVASC) 10 MG tablet Take 1 tablet (10 mg total) by mouth daily. 90 tablet 1   hydrochlorothiazide (MICROZIDE) 12.5 MG capsule Take 1 capsule (12.5 mg total) by mouth daily. 90 capsule 1   labetalol (NORMODYNE) 200 MG tablet Take 1 tablet (200 mg total) by mouth 2 (two) times daily. 180 tablet 1   No facility-administered medications prior to visit.    Allergies  Allergen Reactions   Lisinopril Swelling    ROS Review of Systems  Constitutional:  Positive for fatigue. Negative for chills and fever.  HENT:  Negative for congestion, sinus pressure and sinus pain.   Eyes:  Negative for pain and discharge.  Respiratory:  Negative for cough and shortness of breath.   Cardiovascular:  Negative for chest pain and palpitations.  Gastrointestinal:  Negative for abdominal pain, diarrhea, nausea and vomiting.  Endocrine: Negative for polydipsia and polyuria.  Genitourinary:  Negative for dysuria and hematuria.  Musculoskeletal:  Positive for  arthralgias and back pain. Negative for neck pain and neck stiffness.  Skin:  Negative for rash.  Neurological:  Negative for dizziness and weakness.  Psychiatric/Behavioral:  Negative for agitation and behavioral problems.       Objective:    Physical Exam Vitals reviewed.  Constitutional:      General: She is not in acute distress.    Appearance: She is not diaphoretic.  HENT:     Head: Normocephalic and atraumatic.     Nose: Nose normal. No congestion.     Mouth/Throat:  Mouth: Mucous membranes are moist.     Pharynx: No posterior oropharyngeal erythema.  Eyes:     General: No scleral icterus.    Extraocular Movements: Extraocular movements intact.  Cardiovascular:     Rate and Rhythm: Normal rate and regular rhythm.     Pulses: Normal pulses.     Heart sounds: Normal heart sounds. No murmur heard. Pulmonary:     Breath sounds: Normal breath sounds. No wheezing or rales.  Musculoskeletal:        General: Tenderness (Lumbar spine area and right paraspinal area) present.     Cervical back: Neck supple. No tenderness.     Right lower leg: No edema.     Left lower leg: No edema.     Comments: R shoulder ROM limited due to pain, no point tenderness  Skin:    General: Skin is warm.     Findings: No rash.  Neurological:     General: No focal deficit present.     Mental Status: She is alert and oriented to person, place, and time.  Psychiatric:        Mood and Affect: Mood normal.        Behavior: Behavior normal.     BP 136/84 (BP Location: Left Arm, Patient Position: Sitting, Cuff Size: Normal)   Pulse 96   Resp 18   Ht _0  (1.651 m)   Wt 205 lb 3.2 oz (93.1 kg)   SpO2 98%   BMI 34.15 kg/m  Wt Readings from Last 3 Encounters:  01/27/22 205 lb 3.2 oz (93.1 kg)  01/24/22 205 lb 3.2 oz (93.1 kg)  09/23/21 204 lb (92.5 kg)    Lab Results  Component Value Date   TSH 0.888 01/19/2022   Lab Results  Component Value Date   WBC 4.9 12/28/2020   HGB 13.2  12/28/2020   HCT 40.8 12/28/2020   MCV 78 (L) 12/28/2020   PLT 266 12/28/2020   Lab Results  Component Value Date   NA 141 12/28/2020   K 4.1 12/28/2020   CO2 24 12/28/2020   GLUCOSE 98 12/28/2020   BUN 11 12/28/2020   CREATININE 0.66 12/28/2020   BILITOT 0.3 12/28/2020   ALKPHOS 148 (H) 12/28/2020   AST 12 12/28/2020   ALT 12 12/28/2020   PROT 6.7 12/28/2020   ALBUMIN 4.0 12/28/2020   CALCIUM 9.0 12/28/2020   ANIONGAP 9 05/27/2018   EGFR 101 12/28/2020   Lab Results  Component Value Date   CHOL 175 12/28/2020   Lab Results  Component Value Date   HDL 33 (L) 12/28/2020   Lab Results  Component Value Date   LDLCALC 125 (H) 12/28/2020   Lab Results  Component Value Date   TRIG 94 12/28/2020   Lab Results  Component Value Date   CHOLHDL 5.3 (H) 12/28/2020   No results found for: "HGBA1C"    Assessment & Plan:   Problem List Items Addressed This Visit       Cardiovascular and Mediastinum   Essential hypertension    BP Readings from Last 1 Encounters:  01/27/22 136/84  Usually well-controlled with Amlodipine, HCTZ and Labetalol Counseled for compliance with the medications Advised DASH diet and moderate exercise/walking, at least 150 mins/week      Relevant Medications   hydrochlorothiazide (MICROZIDE) 12.5 MG capsule   amLODipine (NORVASC) 10 MG tablet   labetalol (NORMODYNE) 200 MG tablet     Musculoskeletal and Integument   Rheumatoid arthritis ([place])  Followed by Rheumatology On Plaquenil and Orencia Needs to follow up with Ophthalmology        Other   Encounter for general adult medical examination with abnormal findings - Primary    Physical exam as documented. Counseling done  re healthy lifestyle involving commitment to 150 minutes exercise per week, heart healthy diet, and attaining healthy weight.The importance of adequate sleep also discussed. Changes in health habits are decided on by the patient with goals and time frames  set for  achieving them. Immunization and cancer screening needs are specifically addressed at this visit.      Chronic fatigue    Could be due to multiple factors including RA, sleep disturbance, caregiver stress and beta-blocker Check TSH, vitamin D Review CBC and CMP from rheumatology office Needs to get colonoscopy      Other Visit Diagnoses     Need for immunization against influenza       Relevant Orders   Flu Vaccine QUAD 59moIM (Fluarix, Fluzone & Alfiuria Quad PF) (Completed)   Mixed hyperlipidemia       Relevant Medications   hydrochlorothiazide (MICROZIDE) 12.5 MG capsule   amLODipine (NORVASC) 10 MG tablet   labetalol (NORMODYNE) 200 MG tablet   Other Relevant Orders   Lipid Profile   Prediabetes       Relevant Orders   Hemoglobin A1c   Vitamin D deficiency       Relevant Orders   Vitamin D (25 hydroxy)   Need for hepatitis C screening test       Relevant Orders   Hepatitis C Antibody   Screening for colon cancer       Relevant Orders   Ambulatory referral to Gastroenterology       Meds ordered this encounter  Medications   hydrochlorothiazide (MICROZIDE) 12.5 MG capsule    Sig: Take 1 capsule (12.5 mg total) by mouth daily.    Dispense:  90 capsule    Refill:  1   amLODipine (NORVASC) 10 MG tablet    Sig: Take 1 tablet (10 mg total) by mouth daily.    Dispense:  90 tablet    Refill:  1   labetalol (NORMODYNE) 200 MG tablet    Sig: Take 1 tablet (200 mg total) by mouth 2 (two) times daily.    Dispense:  180 tablet    Refill:  1    Follow-up: Return in about 6 months (around 07/29/2022) for HTN and HLD.    RLindell Spar MD

## 2022-01-27 NOTE — Assessment & Plan Note (Signed)
Followed by Rheumatology On Plaquenil and Orencia Needs to follow up with Ophthalmology 

## 2022-01-27 NOTE — Assessment & Plan Note (Signed)
BP Readings from Last 1 Encounters:  01/27/22 136/84   Usually well-controlled with Amlodipine, HCTZ and Labetalol Counseled for compliance with the medications Advised DASH diet and moderate exercise/walking, at least 150 mins/week

## 2022-05-05 ENCOUNTER — Encounter: Payer: Self-pay | Admitting: Internal Medicine

## 2022-05-05 ENCOUNTER — Ambulatory Visit: Payer: Medicaid Other | Admitting: Internal Medicine

## 2022-05-05 VITALS — BP 114/70 | HR 89 | Ht 65.0 in | Wt 200.0 lb

## 2022-05-05 DIAGNOSIS — J309 Allergic rhinitis, unspecified: Secondary | ICD-10-CM | POA: Diagnosis not present

## 2022-05-05 DIAGNOSIS — Z720 Tobacco use: Secondary | ICD-10-CM | POA: Diagnosis not present

## 2022-05-05 DIAGNOSIS — I10 Essential (primary) hypertension: Secondary | ICD-10-CM | POA: Diagnosis not present

## 2022-05-05 DIAGNOSIS — M069 Rheumatoid arthritis, unspecified: Secondary | ICD-10-CM | POA: Diagnosis not present

## 2022-05-05 DIAGNOSIS — G4733 Obstructive sleep apnea (adult) (pediatric): Secondary | ICD-10-CM | POA: Insufficient documentation

## 2022-05-05 DIAGNOSIS — F17210 Nicotine dependence, cigarettes, uncomplicated: Secondary | ICD-10-CM

## 2022-05-05 DIAGNOSIS — R5382 Chronic fatigue, unspecified: Secondary | ICD-10-CM

## 2022-05-05 NOTE — Assessment & Plan Note (Signed)
STOP BANG: 4 Has wide neck circumference At risk for OSA Referred to pulmonology for sleep study

## 2022-05-05 NOTE — Assessment & Plan Note (Addendum)
Could be due to multiple factors including RA, sleep disturbance, caregiver stress and beta-blocker Get sleep study for OSA Checked TSH, vitamin D Review CBC and CMP from rheumatology office Needs to get colonoscopy

## 2022-05-05 NOTE — Assessment & Plan Note (Signed)
BP Readings from Last 1 Encounters:  05/05/22 114/70   Usually well-controlled with Amlodipine, HCTZ and Labetalol Counseled for compliance with the medications Advised DASH diet and moderate exercise/walking, at least 150 mins/week

## 2022-05-05 NOTE — Patient Instructions (Signed)
Please continue taking medications as prescribed.  Please continue to follow low salt diet and ambulate as tolerated. 

## 2022-05-05 NOTE — Assessment & Plan Note (Signed)
Nasal saline spray as needed Zyrtec or Allegra as needed for allergies Use humidifier and/or vaporizer Sinus rinse as tolerated

## 2022-05-05 NOTE — Progress Notes (Signed)
Established Patient Office Visit  Subjective:  Patient ID: Alejandra Sanders, female    DOB: Aug 16, 1960  Age: 62 y.o. MRN: [id number]  CC:  Chief Complaint  Patient presents with   Breathing Problem    Son states patient is breathing loudly even at rest. Patient does feel tired and congested    HPI Alejandra Sanders is a 62 y.o. female with past medical history of HTN, RA, toxic thyroid nodule and tobacco abuse who presents for f/u of her chronic medical conditions and complaint of heavy breathing.  Her son has noticed that she has been bleeding loudly even while she is awake.  She has history of allergic rhinitis, for which she has tried using humidifier, nasal saline spray and sinus rinse.  She has seen some improvement in her nasal congestion.  Of note, she also reports chronic fatigue and interrupted sleep.  She is not aware of snoring as she sleeps alone.  She currently denies any dyspnea or wheezing.  HTN: BP is well-controlled. Takes medications regularly. Patient denies headache, dizziness, chest pain, dyspnea or palpitations.  She has a history of RA, for which she takes Plaquenil and Orencia. She follows up with rheumatologist in [place].  She has not had ophthalmology visit recently since starting Plaquenil.  Denies any vision problem currently.  She complains of bilateral knee pain, which is worse with walking and prolonged standing.  She has had RAI ablation of toxic thyroid nodule.  Her TSH and free T4 have been WNL without needing thyroid supplement.   Past Medical History:  Diagnosis Date   Arthritis    Calculus of gallbladder with chronic cholecystitis without obstruction    Chronic cholecystitis 06/11/2018   Hypertension    Seizure ([place])    had seizurea as child; unknow etiology; was on phenobarbital for a few years and then was taken off meds. No seizure in 40 years.   Thyroid disease    Trichimoniasis 02/11/2021   Treated with flagyl 02/11/21    Past Surgical  History:  Procedure Laterality Date   CHOLECYSTECTOMY N/A 06/11/2018   Procedure: LAPAROSCOPIC CHOLECYSTECTOMY;  Surgeon: Virl Cagey, MD;  Location: [facility];  Service: General;  Laterality: N/A;   GALLBLADDER SURGERY  06/11/2018   THYROID SURGERY     removal of thyroid    Family History  Problem Relation Age of Onset   Aneurysm Maternal Grandmother    Hypertension Father    Hypertension Mother    Diabetes Mother    Heart attack Brother    Hypertension Sister    Kidney failure Sister    Hypertension Sister    Hypertension Sister    Hypertension Son     Social History   Socioeconomic History   Marital status: Single    Spouse name: Not on file   Number of children: Not on file   Years of education: Not on file   Highest education level: Not on file  Occupational History   Not on file  Tobacco Use   Smoking status: Every Day    Packs/day: 0.25    Years: 35.00    Total pack years: 8.75    Types: Cigarettes   Smokeless tobacco: Never   Tobacco comments:    states smokes 3 cig daily only  Vaping Use   Vaping Use: Never used  Substance and Sexual Activity   Alcohol use: No   Drug use: No   Sexual activity: Not Currently    Birth control/protection: Post-menopausal  Other Topics  Concern   Not on file  Social History Narrative   Not on file   Social Determinants of Health   Financial Resource Strain: Low Risk  (02/08/2021)   Overall Financial Resource Strain (CARDIA)    Difficulty of Paying Living Expenses: Not hard at all  Food Insecurity: No Food Insecurity (02/08/2021)   Hunger Vital Sign    Worried About Running Out of Food in the Last Year: Never true    Ran Out of Food in the Last Year: Never true  Transportation Needs: No Transportation Needs (02/08/2021)   PRAPARE - Administrator, Civil Service (Medical): No    Lack of Transportation (Non-Medical): No  Physical Activity: Insufficiently Active (02/08/2021)   Exercise Vital Sign    Days  of Exercise per Week: 2 days    Minutes of Exercise per Session: 20 min  Stress: No Stress Concern Present (02/08/2021)   [employer] of Occupational Health - Occupational Stress Questionnaire    Feeling of Stress : Only a little  Social Connections: Moderately Integrated (02/08/2021)   Social Connection and Isolation Panel [NHANES]    Frequency of Communication with Friends and Family: More than three times a week    Frequency of Social Gatherings with Friends and Family: More than three times a week    Attends Religious Services: More than 4 times per year    Active Member of [employer] or Organizations: Yes    Attends Banker Meetings: More than 4 times per year    Marital Status: Never married  Intimate Partner Violence: Not At Risk (02/08/2021)   Humiliation, Afraid, Rape, and Kick questionnaire    Fear of Current or Ex-Partner: No    Emotionally Abused: No    Physically Abused: No    Sexually Abused: No    Outpatient Medications Prior to Visit  Medication Sig Dispense Refill   amLODipine (NORVASC) 10 MG tablet Take 1 tablet (10 mg total) by mouth daily. 90 tablet 1   cyclobenzaprine (FLEXERIL) 10 MG tablet Take 1 tablet (10 mg total) by mouth 3 (three) times daily as needed for muscle spasms. 30 tablet 0   diclofenac Sodium (VOLTAREN) 1 % GEL See admin instructions.     hydrochlorothiazide (MICROZIDE) 12.5 MG capsule Take 1 capsule (12.5 mg total) by mouth daily. 90 capsule 1   hydroxychloroquine (PLAQUENIL) 200 MG tablet Take 2 tablets by mouth daily.     labetalol (NORMODYNE) 200 MG tablet Take 1 tablet (200 mg total) by mouth 2 (two) times daily. 180 tablet 1   meloxicam (MOBIC) 15 MG tablet Take 15 mg by mouth daily.     ORENCIA CLICKJECT 125 MG/ML SOAJ Inject 1 pen into the skin once a week. Takes on Tuesdays     No facility-administered medications prior to visit.    Allergies  Allergen Reactions   Lisinopril Swelling    ROS Review of Systems   Constitutional:  Negative for chills and fever.  HENT:  Positive for congestion, postnasal drip and sinus pressure. Negative for sinus pain.   Eyes:  Negative for pain and discharge.  Respiratory:  Negative for cough and shortness of breath.   Cardiovascular:  Negative for chest pain and palpitations.  Gastrointestinal:  Negative for abdominal pain, diarrhea, nausea and vomiting.  Endocrine: Negative for polydipsia and polyuria.  Genitourinary:  Negative for dysuria and hematuria.  Musculoskeletal:  Positive for arthralgias and back pain. Negative for neck pain and neck stiffness.  Skin:  Negative for  rash.  Neurological:  Negative for dizziness and weakness.  Psychiatric/Behavioral:  Negative for agitation and behavioral problems.       Objective:    Physical Exam Vitals reviewed.  Constitutional:      General: She is not in acute distress.    Appearance: She is not diaphoretic.  HENT:     Head: Normocephalic and atraumatic.     Nose: Congestion present.     Mouth/Throat:     Mouth: Mucous membranes are moist.     Pharynx: No posterior oropharyngeal erythema.  Eyes:     General: No scleral icterus.    Extraocular Movements: Extraocular movements intact.  Cardiovascular:     Rate and Rhythm: Normal rate and regular rhythm.     Pulses: Normal pulses.     Heart sounds: Normal heart sounds. No murmur heard. Pulmonary:     Breath sounds: Normal breath sounds. No wheezing or rales.  Musculoskeletal:        General: Tenderness (Lumbar spine area and right paraspinal area) present.     Cervical back: Neck supple. No tenderness.     Right lower leg: No edema.     Left lower leg: No edema.     Comments: R shoulder ROM limited due to pain, no point tenderness  Skin:    General: Skin is warm.     Findings: No rash.  Neurological:     General: No focal deficit present.     Mental Status: She is alert and oriented to person, place, and time.  Psychiatric:        Mood and  Affect: Mood normal.        Behavior: Behavior normal.     BP 114/70 (BP Location: Left Arm, Patient Position: Sitting, Cuff Size: Large)   Pulse 89   Ht 5\' 5"  (1.651 m)   Wt 200 lb (90.7 kg)   SpO2 96%   BMI 33.28 kg/m  Wt Readings from Last 3 Encounters:  05/05/22 200 lb (90.7 kg)  01/27/22 205 lb 3.2 oz (93.1 kg)  01/24/22 205 lb 3.2 oz (93.1 kg)    Lab Results  Component Value Date   TSH 0.888 01/19/2022   Lab Results  Component Value Date   WBC 4.9 12/28/2020   HGB 13.2 12/28/2020   HCT 40.8 12/28/2020   MCV 78 (L) 12/28/2020   PLT 266 12/28/2020   Lab Results  Component Value Date   NA 141 12/28/2020   K 4.1 12/28/2020   CO2 24 12/28/2020   GLUCOSE 98 12/28/2020   BUN 11 12/28/2020   CREATININE 0.66 12/28/2020   BILITOT 0.3 12/28/2020   ALKPHOS 148 (H) 12/28/2020   AST 12 12/28/2020   ALT 12 12/28/2020   PROT 6.7 12/28/2020   ALBUMIN 4.0 12/28/2020   CALCIUM 9.0 12/28/2020   ANIONGAP 9 05/27/2018   EGFR 101 12/28/2020   Lab Results  Component Value Date   CHOL 175 12/28/2020   Lab Results  Component Value Date   HDL 33 (L) 12/28/2020   Lab Results  Component Value Date   LDLCALC 125 (H) 12/28/2020   Lab Results  Component Value Date   TRIG 94 12/28/2020   Lab Results  Component Value Date   CHOLHDL 5.3 (H) 12/28/2020   No results found for: "HGBA1C"    Assessment & Plan:   Problem List Items Addressed This Visit       Cardiovascular and Mediastinum   Essential hypertension    BP Readings  from Last 1 Encounters:  05/05/22 114/70  Usually well-controlled with Amlodipine, HCTZ and Labetalol Counseled for compliance with the medications Advised DASH diet and moderate exercise/walking, at least 150 mins/week        Respiratory   OSA (obstructive sleep apnea) - Primary    STOP BANG: 4 Has wide neck circumference At risk for OSA Referred to pulmonology for sleep study      Relevant Orders   Ambulatory referral to  Pulmonology   Allergic sinusitis    Nasal saline spray as needed Zyrtec or Allegra as needed for allergies Use humidifier and/or vaporizer Sinus rinse as tolerated        Musculoskeletal and Integument   Rheumatoid arthritis ([place])    Followed by Rheumatology On Plaquenil and Orencia Needs to follow up with Ophthalmology        Other   Tobacco abuse    Smokes about 5 cigarettes/day  Asked about quitting: confirms that she currently smokes cigarettes Advise to quit smoking: Educated about QUITTING to reduce the risk of cancer, cardio and cerebrovascular disease. Assess willingness: Unwilling to quit at this time, but is working on cutting back. Assist with counseling and pharmacotherapy: Counseled for 5 minutes and literature provided. Arrange for follow up: follow up in 3 months and continue to offer help.      Chronic fatigue    Could be due to multiple factors including RA, sleep disturbance, caregiver stress and beta-blocker Get sleep study for OSA Checked TSH, vitamin D Review CBC and CMP from rheumatology office Needs to get colonoscopy       No orders of the defined types were placed in this encounter.   Follow-up: Return if symptoms worsen or fail to improve.    Lindell Spar, MD

## 2022-05-05 NOTE — Assessment & Plan Note (Signed)
Smokes about 5 cigarettes/day  Asked about quitting: confirms that she currently smokes cigarettes Advise to quit smoking: Educated about QUITTING to reduce the risk of cancer, cardio and cerebrovascular disease. Assess willingness: Unwilling to quit at this time, but is working on cutting back. Assist with counseling and pharmacotherapy: Counseled for 5 minutes and literature provided. Arrange for follow up: follow up in 3 months and continue to offer help.

## 2022-05-05 NOTE — Assessment & Plan Note (Signed)
Followed by Rheumatology On Plaquenil and Orencia Needs to follow up with Ophthalmology

## 2022-06-05 IMAGING — NM NM THYROID IMAGING W/ UPTAKE MULTI (4&24 HR)
4 series · 4 of 4 positions shown · non-contrast
Comparison: Thyroid ultrasound 01/08/2021

CLINICAL DATA: Suppressed TSH, prior partial thyroidectomy,
multinodular goiter question toxic nodule, hyperthyroidism

EXAM:
THYROID SCAN AND UPTAKE - 4 AND 24 HOURS
TECHNIQUE: Following oral administration of 3-91S capsule, anterior planar
imaging was acquired at 24 hours. Thyroid uptake was calculated with
a thyroid probe at 4-6 hours and 24 hours.
RADIOPHARMACEUTICALS:  315 uCi 3-91S sodium iodide p.o.

[Series 1: anterior · 1.18mm/px · 1 of 1 slices shown]
[im 1/1]
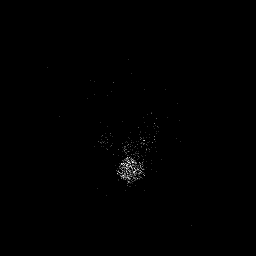

[Series 2: ant w marker · 1.18mm/px · 1 of 1 slices shown]
[im 1/1]
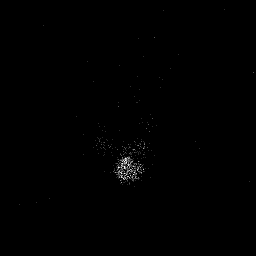

[Series 3: lao · 1.18mm/px · 1 of 1 slices shown]
[im 1/1]
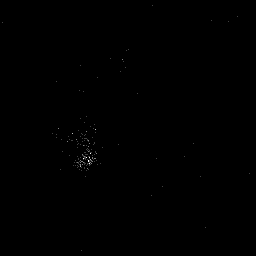

[Series 4: rao · 1.18mm/px · 1 of 1 slices shown]
[im 1/1]
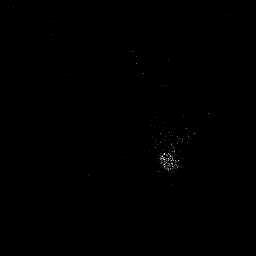

[4 of 4 positions shown; findings below may reference images not displayed]

FINDINGS: Focal hot nodule in the inferior pole of LEFT thyroid lobe
consistent with toxic adenoma.

This corresponds to a 2.5 cm diameter nodule on ultrasound at the
inferior pole.

Suppression of uptake in remaining thyroid lobes.

No additional warm nodules.

Subtle areas of decreased tracer uptake, may represent suppression
or subtle nodules.

4 hour 3-91S uptake = 6.3 % (normal 5-20%)

24 hour 3-91S uptake = 14.8% (normal 10-30%)
IMPRESSION: Normal 4 hour and 24 hour radio iodine uptakes.

Hot nodule inferior pole LEFT thyroid lobe corresponding to a 2.5 cm
diameter nodule seen on prior ultrasound consistent with toxic
adenoma.

Suppression of uptake in remaining thyroid tissue.

## 2022-06-13 ENCOUNTER — Emergency Department (HOSPITAL_COMMUNITY)
Admission: EM | Admit: 2022-06-13 | Discharge: 2022-06-13 | Disposition: A | Discharge status: Home / Self Care | Payer: Medicaid Other | Attending: Emergency Medicine | Admitting: Emergency Medicine

## 2022-06-13 ENCOUNTER — Other Ambulatory Visit: Payer: Self-pay

## 2022-06-13 ENCOUNTER — Encounter (HOSPITAL_COMMUNITY): Payer: Self-pay

## 2022-06-13 ENCOUNTER — Emergency Department (HOSPITAL_COMMUNITY): Payer: Medicaid Other

## 2022-06-13 DIAGNOSIS — R0789 Other chest pain: Secondary | ICD-10-CM | POA: Diagnosis not present

## 2022-06-13 DIAGNOSIS — I2510 Atherosclerotic heart disease of native coronary artery without angina pectoris: Secondary | ICD-10-CM

## 2022-06-13 DIAGNOSIS — I10 Essential (primary) hypertension: Secondary | ICD-10-CM | POA: Insufficient documentation

## 2022-06-13 DIAGNOSIS — Z79899 Other long term (current) drug therapy: Secondary | ICD-10-CM | POA: Diagnosis not present

## 2022-06-13 DIAGNOSIS — R791 Abnormal coagulation profile: Secondary | ICD-10-CM | POA: Diagnosis not present

## 2022-06-13 DIAGNOSIS — R0781 Pleurodynia: Secondary | ICD-10-CM | POA: Diagnosis not present

## 2022-06-13 LAB — CBC WITH DIFFERENTIAL/PLATELET
Abs Immature Granulocytes: 0.01 10*3/uL (ref 0.00–0.07)
Basophils Absolute: 0 10*3/uL (ref 0.0–0.1)
Basophils Relative: 1 %
Eosinophils Absolute: 0.3 10*3/uL (ref 0.0–0.5)
Eosinophils Relative: 4 %
HCT: 42.7 % (ref 36.0–46.0)
Hemoglobin: 13.7 g/dL (ref 12.0–15.0)
Immature Granulocytes: 0 %
Lymphocytes Relative: 44 %
Lymphs Abs: 2.6 10*3/uL (ref 0.7–4.0)
MCH: 26.1 pg (ref 26.0–34.0)
MCHC: 32.1 g/dL (ref 30.0–36.0)
MCV: 81.3 fL (ref 80.0–100.0)
Monocytes Absolute: 0.4 10*3/uL (ref 0.1–1.0)
Monocytes Relative: 6 %
Neutro Abs: 2.7 10*3/uL (ref 1.7–7.7)
Neutrophils Relative %: 45 %
Platelets: 252 10*3/uL (ref 150–400)
RBC: 5.25 MIL/uL — ABNORMAL HIGH (ref 3.87–5.11)
RDW: 14.7 % (ref 11.5–15.5)
WBC: 5.9 10*3/uL (ref 4.0–10.5)
nRBC: 0 % (ref 0.0–0.2)

## 2022-06-13 LAB — BASIC METABOLIC PANEL
Anion gap: 8 (ref 5–15)
BUN: 8 mg/dL (ref 8–23)
CO2: 27 mmol/L (ref 22–32)
Calcium: 9.3 mg/dL (ref 8.9–10.3)
Chloride: 102 mmol/L (ref 98–111)
Creatinine, Ser: 0.63 mg/dL (ref 0.44–1.00)
GFR, Estimated: >60 mL/min (ref >60)
Glucose, Bld: 82 mg/dL (ref 70–99)
Potassium: 3.2 mmol/L — ABNORMAL LOW (ref 3.5–5.1)
Sodium: 137 mmol/L (ref 135–145)

## 2022-06-13 LAB — TROPONIN I (HIGH SENSITIVITY): Troponin I (High Sensitivity): 4 ng/L (ref <18)

## 2022-06-13 LAB — D-DIMER, QUANTITATIVE: D-Dimer, Quant: 2.46 ug/mL-FEU — ABNORMAL HIGH (ref 0.00–0.50)

## 2022-06-13 LAB — BRAIN NATRIURETIC PEPTIDE: B Natriuretic Peptide: 14 pg/mL (ref 0.0–100.0)

## 2022-06-13 MED ORDER — IOHEXOL 350 MG/ML SOLN
75.0000 mL | Freq: Once | INTRAVENOUS | Status: AC | PRN
  Administered 2022-06-13: 75 mL via INTRAVENOUS

## 2022-06-13 MED ORDER — NAPROXEN 500 MG PO TABS: 500.0000 mg | ORAL_TABLET | Freq: Two times a day (BID) | ORAL | 0 refills | Status: DC

## 2022-06-13 MED ORDER — NAPROXEN 250 MG PO TABS: 500.0000 mg | ORAL_TABLET | Freq: Once | ORAL | Status: DC

## 2022-06-13 NOTE — ED Notes (Signed)
Pt was able to ambulate the entire length of the hallway twice and maintain O2 sat of 98 to 100, and was able to carry conversation speaking in full sentences.

## 2022-06-13 NOTE — Discharge Instructions (Addendum)
As discussed,  I suspect your symptoms today are due to a rib cage strain, and you are being prescribed naproxen to help with this pain.  Heating pad applied to the site may also help for 15 minutes several times daily.  Your CT scan is negative for blood clot or retained fluid around your lungs.  As discussed, you do have suggestion of significant plaque buildup in the arteries of your heart which is seen on today's CT scan.  I strongly encourage you to follow-up with cardiology to discuss further testing to ensure heart health or need for any other interventions.  The cardiologist office should call you within 24 hours to arrange an office visit with them.

## 2022-06-13 NOTE — ED Provider Notes (Signed)
[facility] Provider Note   CSN: [id number] Arrival date & time: 06/13/22  1143     History  Chief Complaint  Patient presents with   Chest Pain    Alejandra Sanders is a 62 y.o. female with a history including hypertension, RA, OA,  sleep apnea awaiting a sleep study presenting for evaluation of left-sided chest pain.  She noted a "knot" on her left rib just beneath her breast which is tender and she first noticed 2 days ago.  She endorses pain is worsened with palpation but also with deep inspiration.  She denies increased shortness of breath, also no cough, congestion, fevers or chills, denies nausea vomiting, diaphoresis, palpitations.  Family at bedside states she has been having increased shortness of breath with any exertion and has been seen by her primary MD recently for this hence pending a sleep study currently.  She denies peripheral edema, unable to state if she has orthopnea stating she always sleeps in a recliner chair, for no clear reason, states this is just her habit.  She believes she may have strained her chest, stated she had tried to pick up a case of water at a store 3 days ago and was unable to, it was the next day she noted this pain.  She has had no treatments for her symptoms prior to arrival.  She states she has been compliant with her home medications including her blood pressure medications and her HCTZ.  The history is provided by the patient.       Home Medications Prior to Admission medications   Medication Sig Start Date End Date Taking? Authorizing Provider  naproxen (NAPROSYN) 500 MG tablet Take 1 tablet (500 mg total) by mouth 2 (two) times daily. 06/13/22  Yes Haely Leyland, Almyra Free, PA-C  amLODipine (NORVASC) 10 MG tablet Take 1 tablet (10 mg total) by mouth daily. 01/27/22   Lindell Spar, MD  cyclobenzaprine (FLEXERIL) 10 MG tablet Take 1 tablet (10 mg total) by mouth 3 (three) times daily as needed for muscle spasms.  01/26/21   Lindell Spar, MD  diclofenac Sodium (VOLTAREN) 1 % GEL See admin instructions. 10/17/16   [provider]  hydrochlorothiazide (MICROZIDE) 12.5 MG capsule Take 1 capsule (12.5 mg total) by mouth daily. 01/27/22   Lindell Spar, MD  hydroxychloroquine (PLAQUENIL) 200 MG tablet Take 2 tablets by mouth daily. 04/17/18   [provider]  labetalol (NORMODYNE) 200 MG tablet Take 1 tablet (200 mg total) by mouth 2 (two) times daily. 01/27/22   Lindell Spar, MD  ORENCIA CLICKJECT 0000000 MG/ML SOAJ Inject 1 pen into the skin once a week. Takes on Tuesdays 06/26/18   Virl Cagey, MD      Allergies    Lisinopril    Review of Systems   Review of Systems  Constitutional:  Negative for fever.  HENT:  Negative for congestion and sore throat.   Eyes: Negative.   Respiratory:  Positive for shortness of breath. Negative for chest tightness.   Cardiovascular:  Positive for chest pain. Negative for palpitations and leg swelling.  Gastrointestinal:  Negative for abdominal pain and nausea.  Genitourinary: Negative.   Musculoskeletal:  Negative for arthralgias, joint swelling and neck pain.  Skin: Negative.  Negative for rash and wound.  Neurological:  Negative for dizziness, weakness, light-headedness, numbness and headaches.  Psychiatric/Behavioral: Negative.    All other systems reviewed and are negative.   Physical Exam Updated Vital  Signs BP (!) 182/102   Pulse 85   Temp (!) 97.4 F (36.3 C) (Oral)   Resp 17   Ht '5\' 5"'$  (1.651 m)   Wt 90.7 kg   SpO2 99%   BMI 33.28 kg/m  Physical Exam Vitals and nursing note reviewed.  Constitutional:      Appearance: She is well-developed.  HENT:     Head: Normocephalic and atraumatic.  Eyes:     Conjunctiva/sclera: Conjunctivae normal.  Cardiovascular:     Rate and Rhythm: Normal rate and regular rhythm.     Heart sounds: Normal heart sounds.  Pulmonary:     Effort: Pulmonary effort is normal. No tachypnea or  respiratory distress.     Breath sounds: Normal breath sounds. No stridor. No wheezing.     Comments: Her initial triage pulse ox was documented at 86%.  However nursing staff feels this was a typo was corrected to 96%. Abdominal:     General: Bowel sounds are normal.     Palpations: Abdomen is soft.     Tenderness: There is no abdominal tenderness.  Musculoskeletal:        General: Normal range of motion.     Cervical back: Normal range of motion.  Skin:    General: Skin is warm and dry.  Neurological:     Mental Status: She is alert.     ED Results / Procedures / Treatments   Labs (all labs ordered are listed, but only abnormal results are displayed) Labs Reviewed  D-DIMER, QUANTITATIVE - Abnormal; Notable for the following components:      Result Value   D-Dimer, Quant 2.46 (*)    All other components within normal limits  CBC WITH DIFFERENTIAL/PLATELET - Abnormal; Notable for the following components:   RBC 5.25 (*)    All other components within normal limits  BASIC METABOLIC PANEL - Abnormal; Notable for the following components:   Potassium 3.2 (*)    All other components within normal limits  BRAIN NATRIURETIC PEPTIDE  TROPONIN I (HIGH SENSITIVITY)    EKG EKG Interpretation  Date/Time:  Monday June 13 2022 19:09:31 EDT Ventricular Rate:  79 PR Interval:  142 QRS Duration: 76 QT Interval:  406 QTC Calculation: 465 R Axis:   81 Text Interpretation: Normal sinus rhythm Normal ECG When compared with ECG of 27-May-2018 15:27, No significant change was found Confirmed by Aletta Edouard [phone]) on 06/13/2022 7:10:28 PM  Radiology CT Angio Chest PE W and/or Wo Contrast  Result Date: 06/13/2022 CLINICAL DATA:  Left-sided rib pain when walking. EXAM: CT ANGIOGRAPHY CHEST WITH CONTRAST TECHNIQUE: Multidetector CT imaging of the chest was performed using the standard protocol during bolus administration of intravenous contrast. Multiplanar CT image reconstructions and  MIPs were obtained to evaluate the vascular anatomy. RADIATION DOSE REDUCTION: This exam was performed according to the departmental dose-optimization program which includes automated exposure control, adjustment of the mA and/or kV according to patient size and/or use of iterative reconstruction technique. CONTRAST:  65m OMNIPAQUE IOHEXOL 350 MG/ML SOLN COMPARISON:  Rib radiographs of earlier today. FINDINGS: Cardiovascular: The quality of this exam for evaluation of pulmonary embolism is good. No evidence of pulmonary embolism. Aortic atherosclerosis. Borderline cardiomegaly. Lad coronary artery calcification. Mediastinum/Nodes: Diffuse thyroid enlargement without dominant mass. No mediastinal or hilar adenopathy. Lungs/Pleura: No pleural fluid.  Clear lungs. Upper Abdomen: Cholecystectomy. Segment 2 1.6 cm hepatic cyst. Normal imaged portions of the spleen, stomach, pancreas, right adrenal gland, kidneys. Mild left adrenal thickening and nodularity.  Musculoskeletal: No acute osseous abnormality. Review of the MIP images confirms the above findings. IMPRESSION: No evidence of pulmonary embolism. No explanation for left-sided rib pain. Aortic Atherosclerosis (ICD10-I70.0). Age advanced coronary artery atherosclerosis. Recommend assessment of coronary risk factors. Electronically Signed   By: Abigail Miyamoto M.D.   On: 06/13/2022 18:48   DG Ribs Unilateral W/Chest Left  Result Date: 06/13/2022 CLINICAL DATA:  Point tender left rib cage. EXAM: LEFT RIBS AND CHEST - 3+ VIEW COMPARISON:  Two view chest 05/27/2018 FINDINGS: Low lung volumes. There is pulmonary vascular congestion without overt pulmonary edema. Cardiopericardial silhouette is at upper limits of normal for size. Oblique views of the left ribs show no evidence for an acute displaced rib fracture. IMPRESSION: 1. Low volume film with pulmonary vascular congestion. 2. No acute displaced rib fracture. Electronically Signed   By: Misty Stanley M.D.   On:  06/13/2022 13:41    Procedures Procedures    Medications Ordered in ED Medications  naproxen (NAPROSYN) tablet 500 mg (has no administration in time range)  iohexol (OMNIPAQUE) 350 MG/ML injection 75 mL (75 mLs Intravenous Contrast Given 06/13/22 1818)    ED Course/ Medical Decision Making/ A&P                             Medical Decision Making Patient presenting with well localized left chest wall pain which is reproducible with palpation and deep inspiration.  Family endorses she has significant shortness of breath with exertion, at first patient denied but then concurred that this has been present for months.  Her chest x-ray suggested pulmonary vascular congestion therefore labs were ordered including a BNP which is normal, given pleuritic character of her symptoms a D-dimer was also collected and this was elevated.  We proceeded to CT angio chest to rule out PE, this was negative, also negative for significant pulmonary congestion as well.  She does have aged advanced coronary artery atherosclerosis for which she is being referred to cardiology.  No evidence for unstable angina at this time.    Amount and/or Complexity of Data Reviewed Labs: ordered.    Details: Lab results per above, D-dimer 2.46, BNP is 14.0 she had a negative troponin at 4. Radiology: ordered.    Details: Chest x-ray and CT angio with results per above. ECG/medicine tests: ordered and independent interpretation performed.  Risk Prescription drug management.           Final Clinical Impression(s) / ED Diagnoses Final diagnoses:  Chest wall pain  Coronary artery disease involving native heart without angina pectoris, unspecified vessel or lesion type    Rx / DC Orders ED Discharge Orders          Ordered    naproxen (NAPROSYN) 500 MG tablet  2 times daily        06/13/22 1911    Ambulatory referral to Cardiology       Comments: If you have not heard from the Cardiology office within the next  72 hours please call [phone].   06/13/22 1916              Landis Martins 06/13/22 1916    Isla Pence, MD 06/14/22 [phone]

## 2022-06-13 NOTE — ED Triage Notes (Signed)
Pt states she developed a knot on her left side in her ribs 2 days ago.  States it only is painful if she moves a certain way or if you touch it.  Nothing visible from the outside.  Denies SOB, cough, congestion, N/V/D.

## 2022-07-07 ENCOUNTER — Ambulatory Visit: Payer: Medicaid Other | Admitting: Pulmonary Disease

## 2022-07-07 ENCOUNTER — Telehealth: Payer: Self-pay

## 2022-07-07 ENCOUNTER — Encounter: Payer: Self-pay | Admitting: Pulmonary Disease

## 2022-07-07 VITALS — BP 142/88 | HR 82 | Ht 65.0 in | Wt 198.6 lb

## 2022-07-07 DIAGNOSIS — J309 Allergic rhinitis, unspecified: Secondary | ICD-10-CM | POA: Diagnosis not present

## 2022-07-07 DIAGNOSIS — G4733 Obstructive sleep apnea (adult) (pediatric): Secondary | ICD-10-CM

## 2022-07-07 MED ORDER — MOMETASONE FUROATE 50 MCG/ACT NA SUSP: 1.0000 | Freq: Every day | NASAL | 0 refills | Status: AC

## 2022-07-07 NOTE — Patient Instructions (Signed)
X home sleep test  Trial of chlorpheniramine 4 mg at bedtime  + phenylephrine 10 mg daytime  (combination [employer] brand 'sinus PE') x 1-2 weeks  After that, take zyrtec as needed  X Rx for nasonex - 1 spray each at bedtime

## 2022-07-07 NOTE — Assessment & Plan Note (Signed)
Given excessive daytime somnolence, narrow pharyngeal exam, chronic fatigue & loud snoring, obstructive sleep apnea is very likely & an overnight polysomnogram will be scheduled as a home study. The pathophysiology of obstructive sleep apnea , it's cardiovascular consequences & modes of treatment including CPAP were discused with the patient in detail & they evidenced understanding. Pretest probability is low to intermediate.  She is not a good candidate for appliance

## 2022-07-07 NOTE — Assessment & Plan Note (Signed)
Symptoms appear to be perennial Trial of chlorpheniramine 4 mg at bedtime  + phenylephrine 10 mg daytime  (combination [employer] brand 'sinus PE') x 1-2 weeks  After that, take zyrtec as needed  X Rx for nasonex - 1 spray each at bedtime

## 2022-07-07 NOTE — Progress Notes (Signed)
Subjective:    Patient ID: Alejandra Sanders, female    DOB: [birth year], 62 y.o.   MRN: [id number]  HPI 62 year old smoker referred for evaluation of sleep disordered breathing   PMH -RA since 2010, on Plaquenil and Orencia, follows with Dr. 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  She has tried OTC antihistaminics and nasal saline spray without relief.  She had an ED visit on 3/11 for left-sided chest pain, pleuritic, D-dimer was positive, CT angiogram did not show any evidence of pulm embolism, troponin was negative. She reports interrupted sleep and nonrefreshing sleep.  She also reports chronic daytime fatigue.  She denies choking or gasping episodes in his sleep.  She sleeps 3 to 4 hours at a time Epworth sleepiness score is 5 and she reports some sleepiness while watching TV and lying down to rest in the afternoons.  Bedtime is between 10 PM and midnight, she sleeps on his side with 2 pillows, sleep latency is minimal, she wakes up around 3 AM and then again 1 more time before finally getting out of bed at 7:30 AM feeling occasionally tired without dryness of mouth or headaches.  Her weight has not fluctuated much. She moved from [place] to be with her parents.  They live upstairs and she sleeps downstairs and she wonders if her awakenings are related to looking out for her dad who wakes up at night Snoring has been noted by her son  There is no history suggestive of cataplexy, sleep paralysis or parasomnias   Past Medical History:  Diagnosis Date   Arthritis    Calculus of gallbladder with chronic cholecystitis without obstruction    Chronic cholecystitis 06/11/2018   Hypertension    Seizure    had seizurea as child; unknow etiology; was on phenobarbital for a few years and then was taken off meds. No seizure in 40 years.   Thyroid disease    Trichimoniasis 02/11/2021   Treated with flagyl  02/11/21    Past Surgical History:  Procedure Laterality Date   CHOLECYSTECTOMY N/A 06/11/2018   Procedure: LAPAROSCOPIC CHOLECYSTECTOMY;  Surgeon: Virl Cagey, MD;  Location: [facility];  Service: General;  Laterality: N/A;   GALLBLADDER SURGERY  06/11/2018   THYROID SURGERY     removal of thyroid   Allergies  Allergen Reactions   Lisinopril Swelling    Social History   Socioeconomic History   Marital status: Single    Spouse name: Not on file   Number of children: Not on file   Years of education: Not on file   Highest education level: Not on file  Occupational History   Not on file  Tobacco Use   Smoking status: Every Day    Packs/day: 0.25    Years: 35.00    Additional pack years: 0.00    Total pack years: 8.75    Types: Cigarettes   Smokeless tobacco: Never   Tobacco comments:    states smokes 3 cig daily only  Vaping Use   Vaping Use: Never used  Substance and Sexual Activity   Alcohol use: No   Drug use: No   Sexual activity: Not Currently    Birth control/protection: Post-menopausal  Other Topics Concern   Not on file  Social History Narrative   Not on file   Social Determinants of Health   Financial Resource Strain: Low Risk  (02/08/2021)   Overall Financial Resource Strain (CARDIA)  Difficulty of Paying Living Expenses: Not hard at all  Food Insecurity: No Food Insecurity (02/08/2021)   Hunger Vital Sign    Worried About Running Out of Food in the Last Year: Never true    Ran Out of Food in the Last Year: Never true  Transportation Needs: No Transportation Needs (02/08/2021)   PRAPARE - Hydrologist (Medical): No    Lack of Transportation (Non-Medical): No  Physical Activity: Insufficiently Active (02/08/2021)   Exercise Vital Sign    Days of Exercise per Week: 2 days    Minutes of Exercise per Session: 20 min  Stress: No Stress Concern Present (02/08/2021)   [place]    Feeling of Stress : Only a little  Social Connections: Moderately Integrated (02/08/2021)   Social Connection and Isolation Panel [NHANES]    Frequency of Communication with Friends and Family: More than three times a week    Frequency of Social Gatherings with Friends and Family: More than three times a week    Attends Religious Services: More than 4 times per year    Active Member of [employer] or Organizations: Yes    Attends Archivist Meetings: More than 4 times per year    Marital Status: Never married  Intimate Partner Violence: Not At Risk (02/08/2021)   Humiliation, Afraid, Rape, and Kick questionnaire    Fear of Current or Ex-Partner: No    Emotionally Abused: No    Physically Abused: No    Sexually Abused: No    Family History  Problem Relation Age of Onset   Aneurysm Maternal Grandmother    Hypertension Father    Hypertension Mother    Diabetes Mother    Heart attack Brother    Hypertension Sister    Kidney failure Sister    Hypertension Sister    Hypertension Sister    Hypertension Son      Review of Systems Constitutional: negative for anorexia, fevers and sweats  Eyes: negative for irritation, redness and visual disturbance  Ears, nose, mouth, throat, and face: negative for earaches, epistaxis, nasal congestion and sore throat  Respiratory: negative for cough, dyspnea on exertion, sputum and wheezing  Cardiovascular: negative for chest pain, dyspnea, lower extremity edema, orthopnea, palpitations and syncope  Gastrointestinal: negative for abdominal pain, constipation, diarrhea, melena, nausea and vomiting  Genitourinary:negative for dysuria, frequency and hematuria  Hematologic/lymphatic: negative for bleeding, easy bruising and lymphadenopathy  Musculoskeletal:negative for arthralgias, muscle weakness and stiff joints  Neurological: negative for coordination problems, gait problems, headaches and weakness  Endocrine: negative for  diabetic symptoms including polydipsia, polyuria and weight loss     Objective:   Physical Exam  Gen. Pleasant, well-nourished, in no distress, normal affect ENT - no pallor,icterus, no post nasal drip, swollen nasal turbinates, no polyps dentures upper jaw Neck: No JVD, no thyromegaly, no carotid bruits Lungs: no use of accessory muscles, no dullness to percussion, clear without rales or rhonchi  Cardiovascular: Rhythm regular, heart sounds  normal, no murmurs or gallops, no peripheral edema Abdomen: soft and non-tender, no hepatosplenomegaly, BS normal. Musculoskeletal: No deformities, no cyanosis or clubbing Neuro:  alert, non focal       Assessment & Plan:

## 2022-07-07 NOTE — Telephone Encounter (Signed)
PA request received via CMM for Mometasone Furoate 50MCG/ACT suspension  Preferred medications not listed as tried in chart  Key: Organ

## 2022-07-11 ENCOUNTER — Ambulatory Visit: Payer: Medicaid Other | Admitting: Cardiovascular Disease

## 2022-07-14 ENCOUNTER — Ambulatory Visit: Payer: Medicaid Other | Attending: Cardiovascular Disease | Admitting: Internal Medicine

## 2022-07-14 ENCOUNTER — Encounter: Payer: Self-pay | Admitting: Internal Medicine

## 2022-07-14 VITALS — BP 132/84 | HR 80 | Ht 65.5 in | Wt 198.0 lb

## 2022-07-14 DIAGNOSIS — I2510 Atherosclerotic heart disease of native coronary artery without angina pectoris: Secondary | ICD-10-CM | POA: Diagnosis not present

## 2022-07-14 DIAGNOSIS — I517 Cardiomegaly: Secondary | ICD-10-CM

## 2022-07-14 DIAGNOSIS — R5383 Other fatigue: Secondary | ICD-10-CM

## 2022-07-14 DIAGNOSIS — I2584 Coronary atherosclerosis due to calcified coronary lesion: Secondary | ICD-10-CM

## 2022-07-14 NOTE — Patient Instructions (Signed)
Medication Instructions:  Your physician recommends that you continue on your current medications as directed. Please refer to the Current Medication list given to you today.  *If you need a refill on your cardiac medications before your next appointment, please call your pharmacy*   Lab Work: None If you have labs (blood work) drawn today and your tests are completely normal, you will receive your results only by: MyChart Message (if you have MyChart) OR A paper copy in the mail If you have any lab test that is abnormal or we need to change your treatment, we will call you to review the results.   Testing/Procedures: Your physician has requested that you have an echocardiogram. Echocardiography is a painless test that uses sound waves to create images of your heart. It provides your doctor with information about the size and shape of your heart and how well your heart's chambers and valves are working. This procedure takes approximately one hour. There are no restrictions for this procedure. Please do NOT wear cologne, perfume, aftershave, or lotions (deodorant is allowed). Please arrive 15 minutes prior to your appointment time.    Follow-Up: At [facility], you and your health needs are our priority.  As part of our continuing mission to provide you with exceptional heart care, we have created designated Provider Care Teams.  These Care Teams include your primary Cardiologist (physician) and Advanced Practice Providers (APPs -  Physician Assistants and Nurse Practitioners) who all work together to provide you with the care you need, when you need it.  We recommend signing up for the patient portal called "MyChart".  Sign up information is provided on this After Visit Summary.  MyChart is used to connect with patients for Virtual Visits (Telemedicine).  Patients are able to view lab/test results, encounter notes, upcoming appointments, etc.  Non-urgent messages can be sent to your  provider as well.   To learn more about what you can do with MyChart, go to [email or web address].    Your next appointment:   6 month(s)  Provider:   Luane School, MD    Other Instructions

## 2022-07-14 NOTE — Progress Notes (Signed)
Cardiology Office Note  Date: 07/14/2022   ID: Alejandra DryGloria Whidbee, DOB 02/15/1961, MRN [id number]  PCP:  Anabel HalonPatel, Rutwik K, MD  Cardiologist:  Marjo BickerVishnu P Elnathan Fulford, MD Electrophysiologist:  None   Reason for Office Visit: Evaluation of cardiomegaly at the request of Idol, PA-C   History of Present Illness: Alejandra Sanders is a 62 y.o. female known to have HTN, RA was referred to cardiology clinic for evaluation of cardiomegaly.  Patient had ER visit recently with left wrist pain for which CT angio PE showed no evidence of PE, borderline cardiomegaly and calcification in the LAD artery. She was hence referred to cardiology clinic by the ER.  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 Her sister comes from [place] to help her mother. She said she feels tired and short of breath all the time.  Denies any angina, orthopnea or PND, dizziness, syncope, leg swelling.  Smokes 5 cigarettes/day, denies alcohol use and illicit drug abuse.  Past Medical History:  Diagnosis Date   Arthritis    Calculus of gallbladder with chronic cholecystitis without obstruction    Chronic cholecystitis 06/11/2018   Hypertension    Seizure    had seizurea as child; unknow etiology; was on phenobarbital for a few years and then was taken off meds. No seizure in 40 years.   Thyroid disease    Trichimoniasis 02/11/2021   Treated with flagyl 02/11/21    Past Surgical History:  Procedure Laterality Date   CHOLECYSTECTOMY N/A 06/11/2018   Procedure: LAPAROSCOPIC CHOLECYSTECTOMY;  Surgeon: Lucretia RoersBridges, Lindsay C, MD;  Location: [facility];  Service: General;  Laterality: N/A;   GALLBLADDER SURGERY  06/11/2018   THYROID SURGERY     removal of thyroid    Current Outpatient Medications  Medication Sig Dispense Refill   amLODipine (NORVASC) 10 MG tablet Take 1 tablet (10 mg total) by mouth daily. 90 tablet 1   cyclobenzaprine (FLEXERIL) 10 MG tablet Take 1 tablet (10  mg total) by mouth 3 (three) times daily as needed for muscle spasms. 30 tablet 0   diclofenac Sodium (VOLTAREN) 1 % GEL See admin instructions.     hydrochlorothiazide (MICROZIDE) 12.5 MG capsule Take 1 capsule (12.5 mg total) by mouth daily. 90 capsule 1   hydroxychloroquine (PLAQUENIL) 200 MG tablet Take 2 tablets by mouth daily.     labetalol (NORMODYNE) 200 MG tablet Take 1 tablet (200 mg total) by mouth 2 (two) times daily. 180 tablet 1   mometasone (NASONEX) 50 MCG/ACT nasal spray Place 1 spray into the nose at bedtime. 1 each 0   naproxen (NAPROSYN) 500 MG tablet Take 1 tablet (500 mg total) by mouth 2 (two) times daily. 20 tablet 0   ORENCIA CLICKJECT 125 MG/ML SOAJ Inject 1 pen into the skin once a week. Takes on Tuesdays     No current facility-administered medications for this visit.   Allergies:  Lisinopril   Social History: The patient  reports that she has been smoking cigarettes. She has a 8.75 pack-year smoking history. She has never used smokeless tobacco. She reports that she does not drink alcohol and does not use drugs.   Family History: The patient's family history includes Aneurysm in her maternal grandmother; Diabetes in her mother; Heart attack in her brother; Hypertension in her father, mother, sister, sister, sister, and son; Kidney failure in her sister.   ROS:  Please see the history of present illness. Otherwise, complete review of systems is  positive for none.  All other systems are reviewed and negative.   Physical Exam: VS:  BP 132/84   Pulse 80   Ht 5' 5.5" (1.664 m)   Wt 198 lb (89.8 kg)   SpO2 95%   BMI 32.45 kg/m , BMI Body mass index is 32.45 kg/m.  Wt Readings from Last 3 Encounters:  07/14/22 198 lb (89.8 kg)  07/07/22 198 lb 9.6 oz (90.1 kg)  06/13/22 200 lb (90.7 kg)    General: Patient appears comfortable at rest. HEENT: Conjunctiva and lids normal, oropharynx clear with moist mucosa. Neck: Supple, no elevated JVP or carotid bruits, no  thyromegaly. Lungs: Clear to auscultation, nonlabored breathing at rest. Cardiac: Regular rate and rhythm, no S3 or significant systolic murmur, no pericardial rub. Abdomen: Soft, nontender, no hepatomegaly, bowel sounds present, no guarding or rebound. Extremities: No pitting edema, distal pulses 2+. Skin: Warm and dry. Musculoskeletal: No kyphosis. Neuropsychiatric: Alert and oriented x3, affect grossly appropriate.  ECG:  NSR  Recent Labwork: 01/19/2022: TSH 0.888 06/13/2022: B Natriuretic Peptide 14.0; BUN 8; Creatinine, Ser 0.63; Hemoglobin 13.7; Platelets 252; Potassium 3.2; Sodium 137     Component Value Date/Time   CHOL 175 12/28/2020 0813   TRIG 94 12/28/2020 0813   HDL 33 (L) 12/28/2020 0813   CHOLHDL 5.3 (H) 12/28/2020 0813   LDLCALC 125 (H) 12/28/2020 0813   Assessment and Plan: Patient is a 62 year old F known to have HTN, RA was referred to cardiology clinic for evaluation of cardiomegaly and calcification of LAD.  # Borderline cardiomegaly on CT: Obtain 2D echocardiogram # Calcification of LAD artery: Patient has no symptoms of angina. She has SOB but I think this is secondary to caregiver fatigue. I will reevaluate her symptoms in the next 6 months and will obtain stress test if her symptoms are worsening. # Caregiver fatigue: Patient has chronic fatigue and SOB due to sole caregiver of her parents (90 year old father and 33 year old mother). # HTN, controlled: Continue amlodipine 10 mg once daily, HCTZ 12.5 mg once daily, labetalol 200 mg, HTN per PCP. # Tobacco abuse: Smokes 5 cigarettes/day, smoking cessation counseling provided, Smoking cessation instruction/counseling given:  counseled patient on the dangers of tobacco use, advised patient to stop smoking, and reviewed strategies to maximize success.  Patient understands that she has to quit smoking and is motivated to quit eventually.  I have spent a total of 45 minutes with patient reviewing chart, EKGs, labs and  examining patient as well as establishing an assessment and plan that was discussed with the patient.  > 50% of time was spent in direct patient care.     Medication Adjustments/Labs and Tests Ordered: Current medicines are reviewed at length with the patient today.  Concerns regarding medicines are outlined above.   Tests Ordered: Orders Placed This Encounter  Procedures   ECHOCARDIOGRAM COMPLETE     Medication Changes: No orders of the defined types were placed in this encounter.   Disposition:  Follow up  6 months  Signed, Vennie Waymire Verne Spurr, MD, 07/14/2022 8:05 AM    [facility] [address] [place], [place] [address]

## 2022-07-19 DIAGNOSIS — E0510 Thyrotoxicosis with toxic single thyroid nodule without thyrotoxic crisis or storm: Secondary | ICD-10-CM | POA: Diagnosis not present

## 2022-07-19 DIAGNOSIS — E890 Postprocedural hypothyroidism: Secondary | ICD-10-CM | POA: Diagnosis not present

## 2022-07-20 LAB — TSH: TSH: 0.699 u[IU]/mL (ref 0.450–4.500)

## 2022-07-20 LAB — T3, FREE: T3, Free: 3.3 pg/mL (ref 2.0–4.4)

## 2022-07-20 LAB — T4, FREE: Free T4: 1.23 ng/dL (ref 0.82–1.77)

## 2022-07-21 DIAGNOSIS — H5213 Myopia, bilateral: Secondary | ICD-10-CM | POA: Diagnosis not present

## 2022-07-26 ENCOUNTER — Encounter: Payer: Self-pay | Admitting: Nurse Practitioner

## 2022-07-26 ENCOUNTER — Ambulatory Visit: Payer: Medicaid Other | Admitting: Nurse Practitioner

## 2022-07-26 VITALS — BP 138/88 | HR 65 | Ht 65.5 in | Wt 198.8 lb

## 2022-07-26 DIAGNOSIS — E0510 Thyrotoxicosis with toxic single thyroid nodule without thyrotoxic crisis or storm: Secondary | ICD-10-CM | POA: Diagnosis not present

## 2022-07-26 NOTE — Progress Notes (Signed)
07/26/2022     Endocrinology Follow Up Note    Subjective:    Patient ID: Alejandra Sanders, female    DOB: [birth year], PCP Anabel Halon, MD.   Past Medical History:  Diagnosis Date   Arthritis    Calculus of gallbladder with chronic cholecystitis without obstruction    Chronic cholecystitis 06/11/2018   Hypertension    Seizure    had seizurea as child; unknow etiology; was on phenobarbital for a few years and then was taken off meds. No seizure in 40 years.   Thyroid disease    Trichimoniasis 02/11/2021   Treated with flagyl 02/11/21    Past Surgical History:  Procedure Laterality Date   CHOLECYSTECTOMY N/A 06/11/2018   Procedure: LAPAROSCOPIC CHOLECYSTECTOMY;  Surgeon: Lucretia Roers, MD;  Location: [facility];  Service: General;  Laterality: N/A;   GALLBLADDER SURGERY  06/11/2018   THYROID SURGERY     removal of thyroid    Social History   Socioeconomic History   Marital status: Single    Spouse name: Not on file   Number of children: Not on file   Years of education: Not on file   Highest education level: Not on file  Occupational History   Not on file  Tobacco Use   Smoking status: Every Day    Packs/day: 0.25    Years: 35.00    Additional pack years: 0.00    Total pack years: 8.75    Types: Cigarettes   Smokeless tobacco: Never   Tobacco comments:    states smokes 3 cig daily only  Substance and Sexual Activity   Alcohol use: No   Drug use: No   Sexual activity: Not Currently    Birth control/protection: Post-menopausal  Other Topics Concern   Not on file  Social History Narrative   Not on file   Social Determinants of Health   Financial Resource Strain: Low Risk  (02/08/2021)   Overall Financial Resource Strain (CARDIA)    Difficulty of Paying Living Expenses: Not hard at all  Food Insecurity: No Food Insecurity (02/08/2021)   Hunger Vital Sign    Worried About Running Out of Food in the Last Year: Never true    Ran Out of Food in  the Last Year: Never true  Transportation Needs: No Transportation Needs (02/08/2021)   PRAPARE - Administrator, Civil Service (Medical): No    Lack of Transportation (Non-Medical): No  Physical Activity: Insufficiently Active (02/08/2021)   Exercise Vital Sign    Days of Exercise per Week: 2 days    Minutes of Exercise per Session: 20 min  Stress: No Stress Concern Present (02/08/2021)   [employer] of Occupational Health - Occupational Stress Questionnaire    Feeling of Stress : Only a little  Social Connections: Moderately Integrated (02/08/2021)   Social Connection and Isolation Panel [NHANES]    Frequency of Communication with Friends and Family: More than three times a week    Frequency of Social Gatherings with Friends and Family: More than three times a week    Attends Religious Services: More than 4 times per year    Active Member of [employer] or Organizations: Yes    Attends Engineer, structural: More than 4 times per year    Marital Status: Never married    Family History  Problem Relation Age of Onset   Aneurysm Maternal Grandmother    Hypertension Father    Hypertension Mother  Diabetes Mother    Heart attack Brother    Hypertension Sister    Kidney failure Sister    Hypertension Sister    Hypertension Sister    Hypertension Son     Outpatient Encounter Medications as of 07/26/2022  Medication Sig   amLODipine (NORVASC) 10 MG tablet Take 1 tablet (10 mg total) by mouth daily.   cyclobenzaprine (FLEXERIL) 10 MG tablet Take 1 tablet (10 mg total) by mouth 3 (three) times daily as needed for muscle spasms.   diclofenac Sodium (VOLTAREN) 1 % GEL See admin instructions.   hydrochlorothiazide (MICROZIDE) 12.5 MG capsule Take 1 capsule (12.5 mg total) by mouth daily.   hydroxychloroquine (PLAQUENIL) 200 MG tablet Take 2 tablets by mouth daily.   labetalol (NORMODYNE) 200 MG tablet Take 1 tablet (200 mg total) by mouth 2 (two) times daily.    mometasone (NASONEX) 50 MCG/ACT nasal spray Place 1 spray into the nose at bedtime.   naproxen (NAPROSYN) 500 MG tablet Take 1 tablet (500 mg total) by mouth 2 (two) times daily.   ORENCIA CLICKJECT 125 MG/ML SOAJ Inject 1 pen into the skin once a week. Takes on Tuesdays   No facility-administered encounter medications on file as of 07/26/2022.    ALLERGIES: Allergies  Allergen Reactions   Lisinopril Swelling    VACCINATION STATUS: Immunization History  Administered Date(s) Administered   Influenza,inj,Quad PF,6+ Mos 12/15/2020, 01/27/2022   Moderna SARS-COV2 Booster Vaccination 06/14/2019, 02/22/2020   Moderna Sars-Covid-2 Vaccination 05/16/2019     HPI  Alejandra Sanders is 62 y.o. female who presents today with a medical history as above. she is being seen in follow up after being seen in consultation for hyperthyroidism requested by Anabel Halon, MD.  She has a history of partial thyroidectomy as a result of large goiter back in 2001.  She never needed thyroid hormone replacement after her surgery.  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  she denies dysphagia, choking, shortness of breath, no recent voice change.    she does have family history of thyroid dysfunction in her sister and cousin (both requiring surgery 1 with goiter the other with hyperthyroidism), but denies family hx of thyroid cancer.  she is not on any anti-thyroid medications nor on any thyroid hormone supplements. Denies use of Biotin containing supplements.   Review of systems  Constitutional: + Minimally fluctuating body weight,  current Body mass index is 32.58 kg/m. , + fatigue-has workup with cardiology for suspected heart etiology, no subjective hyperthermia, no subjective hypothermia Eyes: no blurry vision, no xerophthalmia ENT: no sore throat, no nodules palpated in throat, no dysphagia/odynophagia, no hoarseness, Cardiovascular: no chest pain, no shortness of breath, no palpitations, no leg  swelling Respiratory: no cough, no shortness of breath Gastrointestinal: no nausea/vomiting/diarrhea Musculoskeletal: no muscle/joint aches Skin: no rashes, no hyperemia Neurological: no tremors, no numbness, no tingling, no dizziness Psychiatric: no depression, no anxiety   Objective:    BP 138/88 (BP Location: Right Arm, Patient Position: Sitting, Cuff Size: Large)   Pulse 65   Ht 5' 5.5" (1.664 m)   Wt 198 lb 12.8 oz (90.2 kg)   BMI 32.58 kg/m   Wt Readings from Last 3 Encounters:  07/26/22 198 lb 12.8 oz (90.2 kg)  07/14/22 198 lb (89.8 kg)  07/07/22 198 lb 9.6 oz (90.1 kg)     BP Readings from Last 3 Encounters:  07/26/22 138/88  07/14/22 132/84  07/07/22 (!) 142/88  Physical Exam- Limited  Constitutional:  Body mass index is 32.58 kg/m. , not in acute distress, normal state of mind Eyes:  EOMI, no exophthalmos Neck: Supple, surgical scar barely visible from partial thyroidectomy Musculoskeletal: no gross deformities, strength intact in all four extremities, no gross restriction of joint movements Skin:  no rashes, no hyperemia Neurological: no tremor with outstretched hands   CMP     Component Value Date/Time   NA 137 06/13/2022 1706   NA 141 12/28/2020 0813   K 3.2 (L) 06/13/2022 1706   CL 102 06/13/2022 1706   CO2 27 06/13/2022 1706   GLUCOSE 82 06/13/2022 1706   BUN 8 06/13/2022 1706   BUN 11 12/28/2020 0813   CREATININE 0.63 06/13/2022 1706   CALCIUM 9.3 06/13/2022 1706   PROT 6.7 12/28/2020 0813   ALBUMIN 4.0 12/28/2020 0813   AST 12 12/28/2020 0813   ALT 12 12/28/2020 0813   ALKPHOS 148 (H) 12/28/2020 0813   BILITOT 0.3 12/28/2020 0813   GFRNONAA >60 06/13/2022 1706   GFRAA >60 05/27/2018 1528     CBC    Component Value Date/Time   WBC 5.9 06/13/2022 1706   RBC 5.25 (H) 06/13/2022 1706   HGB 13.7 06/13/2022 1706   HGB 13.2 12/28/2020 0813   HCT 42.7 06/13/2022 1706   HCT 40.8 12/28/2020 0813   PLT 252 06/13/2022 1706   PLT  266 12/28/2020 0813   MCV 81.3 06/13/2022 1706   MCV 78 (L) 12/28/2020 0813   MCH 26.1 06/13/2022 1706   MCHC 32.1 06/13/2022 1706   RDW 14.7 06/13/2022 1706   RDW 13.0 12/28/2020 0813   LYMPHSABS 2.6 06/13/2022 1706   LYMPHSABS 2.1 12/28/2020 0813   MONOABS 0.4 06/13/2022 1706   EOSABS 0.3 06/13/2022 1706   EOSABS 0.2 12/28/2020 0813   BASOSABS 0.0 06/13/2022 1706   BASOSABS 0.0 12/28/2020 0813     Diabetic Labs (most recent): No results found for: "HGBA1C", "MICROALBUR"  Lipid Panel     Component Value Date/Time   CHOL 175 12/28/2020 0813   TRIG 94 12/28/2020 0813   HDL 33 (L) 12/28/2020 0813   CHOLHDL 5.3 (H) 12/28/2020 0813   LDLCALC 125 (H) 12/28/2020 0813   LABVLDL 17 12/28/2020 0813     Lab Results  Component Value Date   TSH 0.699 07/19/2022   TSH 0.888 01/19/2022   TSH 1.550 09/16/2021   TSH 1.170 05/26/2021   TSH 2.120 03/25/2021   TSH 0.048 (L) 01/07/2021   TSH 0.053 (L) 12/28/2020   FREET4 1.23 07/19/2022   FREET4 1.32 01/19/2022   FREET4 1.19 09/16/2021   FREET4 1.17 05/26/2021   FREET4 1.00 03/25/2021   FREET4 1.32 01/07/2021     US Thyroid 01/08/21 CLINICAL DATA:  62 year old female with a history hyperthyroidism   EXAM: THYROID ULTRASOUND   TECHNIQUE: Ultrasound examination of the thyroid gland and adjacent soft tissues was performed.   COMPARISON:  None.   FINDINGS: Parenchymal Echotexture: Markedly heterogenous   Isthmus: 0.8 cm   Right lobe: 4.8 cm x 2.4 cm x 2.5 cm   Left lobe: 5.9 cm x 3.3 cm x 4.4 cm   _________________________________________________________   Estimated total number of nodules >/= 1 cm: 6-10   Number of spongiform nodules >/=  2 cm not described below (TR1): 0   Number of mixed cystic and solid nodules >/= 1.5 cm not described below (TR2): 0   _________________________________________________________   Nodule # 1:   Location: Isthmus; mid  Maximum size: 1.3 cm; Other 2 dimensions: 0.9 cm x 0.7  cm   Composition: cannot determine (2)   Echogenicity: isoechoic (1)   Shape: not taller-than-wide (0)   Margins: ill-defined (0)   Echogenic foci: none (0)   ACR TI-RADS total points: 3.   ACR TI-RADS risk category: TR3 (3 points).   ACR TI-RADS recommendations:   Nodule does not meet criteria for surveillance or biopsy   _________________________________________________________   Nodule # 2:   Location: Right; Superior   Maximum size: 3.0 cm; Other 2 dimensions: 2.1 cm x 2.0 cm   Composition: solid/almost completely solid (2)   Echogenicity: isoechoic (1)   Shape: not taller-than-wide (0)   Margins: ill-defined (0)   Echogenic foci: none (0)   ACR TI-RADS total points: 3.   ACR TI-RADS risk category: TR3 (3 points).   ACR TI-RADS recommendations:   Nodule meets criteria for biopsy   _________________________________________________________   Nodule # 3:   Location: Right; Mid   Maximum size: 2.0 cm; Other 2 dimensions: 1.9 cm x 1.5 cm   Composition: cannot determine (2)   Echogenicity: isoechoic (1)   Shape: not taller-than-wide (0)   Margins: ill-defined (0)   Echogenic foci: none (0)   ACR TI-RADS total points: 3.   ACR TI-RADS risk category: TR3 (3 points).   ACR TI-RADS recommendations:   Nodule meets criteria for surveillance   _________________________________________________________   Nodule # 4:   Location: Left; Superior   Maximum size: 1.7 cm; Other 2 dimensions: 1.7 cm x 1.5 cm   Composition: solid/almost completely solid (2)   Echogenicity: isoechoic (1)   Shape: not taller-than-wide (0)   Margins: smooth (0)   Echogenic foci: none (0)   ACR TI-RADS total points: 3.   ACR TI-RADS risk category: TR3 (3 points).   ACR TI-RADS recommendations:   Nodule meets criteria for surveillance   _________________________________________________________   Nodule # 5:   Location: Left; Mid   Maximum size: 2.3 cm;  Other 2 dimensions: 2.0 cm x 1.8 cm   Composition: solid/almost completely solid (2)   Echogenicity: hyperechoic (1)   Shape: not taller-than-wide (0)   Margins: ill-defined (0)   Echogenic foci: none (0)   ACR TI-RADS total points: 3.   ACR TI-RADS risk category: TR3 (3 points).   ACR TI-RADS recommendations:   Nodule meets criteria for surveillance   _________________________________________________________   Nodule # 6:   Location: Left; Mid   Maximum size: 1.8 cm; Other 2 dimensions: 1.5 cm x 1.4 cm   Composition: mixed cystic and solid (1)   Echogenicity: isoechoic (1)   Shape: not taller-than-wide (0)   Margins: ill-defined (0)   Echogenic foci: none (0)   ACR TI-RADS total points: 2.   ACR TI-RADS risk category: TR2 (2 points).   ACR TI-RADS recommendations:   Cystic nodule does not meet criteria for surveillance or biopsy   _________________________________________________________   Nodule # 7:   Location: Left; Inferior   Maximum size: 2.4 cm; Other 2 dimensions: 2.4 cm x 2.1 cm   Composition: solid/almost completely solid (2)   Echogenicity: isoechoic (1)   Shape: not taller-than-wide (0)   Margins: ill-defined (0)   Echogenic foci: none (0)   ACR TI-RADS total points: 3.   ACR TI-RADS risk category: TR3 (3 points).   ACR TI-RADS recommendations:   Nodule meets criteria for surveillance   _________________________________________________________   No adenopathy   IMPRESSION: Multinodular thyroid.   Right superior thyroid  nodule (labeled 2, 3.0 cm, TR 3) meets criteria for biopsy, as designated by the newly established ACR TI-RADS criteria, and referral for biopsy is recommended.   Right inferior thyroid nodule (labeled 3), left superior thyroid nodules (labeled 4 and labeled 5), and left inferior thyroid nodule (labeled 7) all meet criteria for surveillance, as designated by the newly established ACR TI-RADS criteria.  Surveillance ultrasound study recommended to be performed annually up to 5 years.   Recommendations follow those established by the new ACR TI-RADS criteria (J Am Coll Radiol 2017;14:587-595).     Electronically Signed   By: Gilmer Mor D.O.   On: 01/08/2021 14:12 --------------------------------------------------------------------------------------------------------------  Uptake and scan 01/26/21 CLINICAL DATA:  Suppressed TSH, prior partial thyroidectomy, multinodular goiter question toxic nodule, hyperthyroidism   EXAM: THYROID SCAN AND UPTAKE - 4 AND 24 HOURS   TECHNIQUE: Following oral administration of I-123 capsule, anterior planar imaging was acquired at 24 hours. Thyroid uptake was calculated with a thyroid probe at 4-6 hours and 24 hours.   RADIOPHARMACEUTICALS:  315 uCi I-123 sodium iodide p.o.   COMPARISON:  Thyroid ultrasound 01/08/2021   FINDINGS: Focal hot nodule in the inferior pole of LEFT thyroid lobe consistent with toxic adenoma.   This corresponds to a 2.5 cm diameter nodule on ultrasound at the inferior pole.   Suppression of uptake in remaining thyroid lobes.   No additional warm nodules.   Subtle areas of decreased tracer uptake, may represent suppression or subtle nodules.   4 hour I-123 uptake = 6.3 % (normal 5-20%)   24 hour I-123 uptake = 14.8% (normal 10-30%)   IMPRESSION: Normal 4 hour and 24 hour radio iodine uptakes.   Hot nodule inferior pole LEFT thyroid lobe corresponding to a 2.5 cm diameter nodule seen on prior ultrasound consistent with toxic adenoma.   Suppression of uptake in remaining thyroid tissue.     Electronically Signed   By: Ulyses Southward M.D.   On: 01/27/2021 16:01   Latest Reference Range & Units 03/25/21 08:41 05/26/21 08:30 09/16/21 08:57 01/19/22 09:15 07/19/22 08:57  TSH 0.450 - 4.500 uIU/mL 2.120 1.170 1.550 0.888 0.699  Triiodothyronine,Free,Serum 2.0 - 4.4 pg/mL 3.1 3.1 3.8 3.3 3.3   T4,Free(Direct) 0.82 - 1.77 ng/dL 4.09 8.11 9.14 7.82 9.56    Assessment & Plan:   1. 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  Her uptake and scan showed hot nodule in left inferior pole, consistent with toxic thyroid nodule.   -She had RAI ablation on 02/11/21.   -Her repeat thyroid function tests show -euthyroid presentation.  She does not need thyroid hormone supplement or antithyroid medications at this time. There is a possibility that she will not need thyroid hormone replacement in the future.  Will repeat thyroid function tests in 6 months for surveillance.   -Patient is advised to maintain close follow up with Anabel Halon, MD for primary care needs.     I spent  15  minutes in the care of the patient today including review of labs from Thyroid Function, CMP, and other relevant labs ; imaging/biopsy records (current and previous including abstractions from other facilities); face-to-face time discussing  her lab results and symptoms, medications doses, her options of short and long term treatment based on the latest standards of care / guidelines;   and documenting the encounter.  Arnold Villafranca  participated in the discussions, expressed  understanding, and voiced agreement with the above plans.  All questions were answered to her satisfaction. she is encouraged to contact clinic should she have any questions or concerns prior to her return visit.    Follow up plan: Return in about 6 months (around 01/25/2023) for Thyroid follow up, Previsit labs.   Thank you for involving me in the care of this pleasant patient, and I will continue to update you with her progress.  Ronny Bacon, [facility] [address] [place], [place] [address] Phone: [phone] Fax: [phone]  07/26/2022, 10:07 AM

## 2022-07-29 ENCOUNTER — Ambulatory Visit: Payer: Medicaid Other | Admitting: Internal Medicine

## 2022-08-10 ENCOUNTER — Ambulatory Visit: Payer: Medicaid Other

## 2022-08-10 DIAGNOSIS — G4733 Obstructive sleep apnea (adult) (pediatric): Secondary | ICD-10-CM

## 2022-08-10 DIAGNOSIS — R0683 Snoring: Secondary | ICD-10-CM | POA: Diagnosis not present

## 2022-08-15 ENCOUNTER — Ambulatory Visit (HOSPITAL_COMMUNITY)
Admission: RE | Admit: 2022-08-15 | Discharge: 2022-08-15 | Disposition: A | Discharge status: Home / Self Care | Payer: Medicaid Other | Source: Ambulatory Visit | Attending: Internal Medicine | Admitting: Internal Medicine

## 2022-08-15 DIAGNOSIS — I517 Cardiomegaly: Secondary | ICD-10-CM | POA: Diagnosis not present

## 2022-08-15 LAB — ECHOCARDIOGRAM COMPLETE
Area-P 1/2: 3.85 cm2
S' Lateral: 2.7 cm

## 2022-08-15 NOTE — Progress Notes (Signed)
*  PRELIMINARY RESULTS* Echocardiogram 2D Echocardiogram has been performed.  Stacey Drain 08/15/2022, 9:20 AM

## 2022-08-17 ENCOUNTER — Telehealth: Payer: Self-pay

## 2022-08-17 NOTE — Telephone Encounter (Signed)
-----   Message from Marjo Bicker, MD sent at 08/16/2022  2:56 PM EDT ----- Normal pumping function of the heart and no valve abnormalities.

## 2022-08-17 NOTE — Telephone Encounter (Signed)
Patient notified via My Chart

## 2022-08-18 ENCOUNTER — Telehealth: Payer: Self-pay | Admitting: Pulmonary Disease

## 2022-08-18 DIAGNOSIS — R0683 Snoring: Secondary | ICD-10-CM | POA: Diagnosis not present

## 2022-08-18 NOTE — Telephone Encounter (Signed)
HST did not show sig  OSA  Follow up as needed if symptoms get worse

## 2022-08-19 NOTE — Telephone Encounter (Signed)
Called and spoke w/ pt she verbalized understanding of RA's message. NFN att.

## 2022-09-27 ENCOUNTER — Ambulatory Visit: Payer: Medicaid Other | Admitting: Internal Medicine

## 2022-09-27 ENCOUNTER — Encounter: Payer: Self-pay | Admitting: Internal Medicine

## 2022-09-27 VITALS — BP 135/85 | HR 79 | Ht 65.0 in | Wt 199.6 lb

## 2022-09-27 DIAGNOSIS — M069 Rheumatoid arthritis, unspecified: Secondary | ICD-10-CM | POA: Diagnosis not present

## 2022-09-27 DIAGNOSIS — R7303 Prediabetes: Secondary | ICD-10-CM | POA: Diagnosis not present

## 2022-09-27 DIAGNOSIS — I10 Essential (primary) hypertension: Secondary | ICD-10-CM | POA: Diagnosis not present

## 2022-09-27 DIAGNOSIS — E559 Vitamin D deficiency, unspecified: Secondary | ICD-10-CM | POA: Diagnosis not present

## 2022-09-27 DIAGNOSIS — R5382 Chronic fatigue, unspecified: Secondary | ICD-10-CM

## 2022-09-27 DIAGNOSIS — E782 Mixed hyperlipidemia: Secondary | ICD-10-CM | POA: Diagnosis not present

## 2022-09-27 DIAGNOSIS — Z1211 Encounter for screening for malignant neoplasm of colon: Secondary | ICD-10-CM

## 2022-09-27 DIAGNOSIS — Z1159 Encounter for screening for other viral diseases: Secondary | ICD-10-CM | POA: Diagnosis not present

## 2022-09-27 DIAGNOSIS — E0510 Thyrotoxicosis with toxic single thyroid nodule without thyrotoxic crisis or storm: Secondary | ICD-10-CM

## 2022-09-27 DIAGNOSIS — Z114 Encounter for screening for human immunodeficiency virus [HIV]: Secondary | ICD-10-CM | POA: Diagnosis not present

## 2022-09-27 DIAGNOSIS — Z23 Encounter for immunization: Secondary | ICD-10-CM | POA: Diagnosis not present

## 2022-09-27 DIAGNOSIS — F5101 Primary insomnia: Secondary | ICD-10-CM

## 2022-09-27 MED ORDER — LABETALOL HCL 200 MG PO TABS: 200.0000 mg | ORAL_TABLET | Freq: Two times a day (BID) | ORAL | 1 refills | Status: DC

## 2022-09-27 MED ORDER — AMLODIPINE BESYLATE 10 MG PO TABS: 10.0000 mg | ORAL_TABLET | Freq: Every day | ORAL | 1 refills | Status: DC

## 2022-09-27 MED ORDER — AMITRIPTYLINE HCL 10 MG PO TABS: 10.0000 mg | ORAL_TABLET | Freq: Every day | ORAL | 3 refills | Status: DC

## 2022-09-27 MED ORDER — HYDROCHLOROTHIAZIDE 12.5 MG PO CAPS: 12.5000 mg | ORAL_CAPSULE | Freq: Every day | ORAL | 1 refills | Status: DC

## 2022-09-27 NOTE — Assessment & Plan Note (Signed)
S/p RAI ablation ?Followed by endocrinology ?

## 2022-09-27 NOTE — Assessment & Plan Note (Signed)
BP Readings from Last 1 Encounters:  09/27/22 135/85   Usually well-controlled with Amlodipine, HCTZ and Labetalol Counseled for compliance with the medications Advised DASH diet and moderate exercise/walking, at least 150 mins/week

## 2022-09-27 NOTE — Assessment & Plan Note (Signed)
Followed by Rheumatology On Plaquenil and Orencia Needs to follow up with Ophthalmology 

## 2022-09-27 NOTE — Patient Instructions (Signed)
Please start taking Amitriptyline as prescribed.  Please continue to take medications as prescribed.  Please continue to follow low carb diet and perform moderate exercise/walking at least 150 mins/week.  Please get fasting blood tests done before the next visit.

## 2022-09-27 NOTE — Progress Notes (Signed)
Established Patient Office Visit  Subjective:  Patient ID: Alejandra Sanders, female    DOB: 1960/05/26  Age: 62 y.o. MRN: [id number]  CC:  Chief Complaint  Patient presents with   Hypertension   Hyperlipidemia    HPI Alejandra Sanders is a 62 y.o. female with past medical history of HTN, RA, toxic thyroid nodule and tobacco abuse who presents for f/u of her chronic medical conditions and complaint of heavy breathing.  HTN: BP is well-controlled. Takes medications regularly. Patient denies headache, dizziness, chest pain, dyspnea or palpitations.  She has a history of RA, for which she takes Plaquenil and Orencia. She follows up with rheumatologist in [place].  She has not had ophthalmology visit recently since starting Plaquenil.  Denies any vision problem currently.  She complains of bilateral knee pain, which is worse with walking and prolonged standing.  She has had RAI ablation of toxic thyroid nodule.  Her TSH and free T4 have been WNL without needing thyroid supplement.  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  She wakes up due to the movement of her parents walking in her home at nighttime.  She feels tired throughout the day.  She had sleep study, which did not show OSA.  Her thyroid profile have been WNL recently.  She denies any recent change in her appetite or weight, fever, chills, chronic cough, hemoptysis, night sweats or LAD.  Past Medical History:  Diagnosis Date   Arthritis    Calculus of gallbladder with chronic cholecystitis without obstruction    Chronic cholecystitis 06/11/2018   Hypertension    Seizure (HCC)    had seizurea as child; unknow etiology; was on phenobarbital for a few years and then was taken off meds. No seizure in 40 years.   Thyroid disease    Trichimoniasis 02/11/2021   Treated with flagyl 02/11/21    Past Surgical History:  Procedure Laterality Date   CHOLECYSTECTOMY N/A 06/11/2018   Procedure:  LAPAROSCOPIC CHOLECYSTECTOMY;  Surgeon: Lucretia Roers, MD;  Location: [facility];  Service: General;  Laterality: N/A;   GALLBLADDER SURGERY  06/11/2018   THYROID SURGERY     removal of thyroid    Family History  Problem Relation Age of Onset   Aneurysm Maternal Grandmother    Hypertension Father    Hypertension Mother    Diabetes Mother    Heart attack Brother    Hypertension Sister    Kidney failure Sister    Hypertension Sister    Hypertension Sister    Hypertension Son     Social History   Socioeconomic History   Marital status: Single    Spouse name: Not on file   Number of children: Not on file   Years of education: Not on file   Highest education level: Not on file  Occupational History   Not on file  Tobacco Use   Smoking status: Every Day    Packs/day: 0.25    Years: 35.00    Additional pack years: 0.00    Total pack years: 8.75    Types: Cigarettes   Smokeless tobacco: Never   Tobacco comments:    states smokes 3 cig daily only  Substance and Sexual Activity   Alcohol use: No   Drug use: No   Sexual activity: Not Currently    Birth control/protection: Post-menopausal  Other Topics Concern   Not on file  Social History Narrative   Not on file   Social  Determinants of Health   Financial Resource Strain: Low Risk  (02/08/2021)   Overall Financial Resource Strain (CARDIA)    Difficulty of Paying Living Expenses: Not hard at all  Food Insecurity: No Food Insecurity (02/08/2021)   Hunger Vital Sign    Worried About Running Out of Food in the Last Year: Never true    Ran Out of Food in the Last Year: Never true  Transportation Needs: No Transportation Needs (02/08/2021)   PRAPARE - Administrator, Civil Service (Medical): No    Lack of Transportation (Non-Medical): No  Physical Activity: Insufficiently Active (02/08/2021)   Exercise Vital Sign    Days of Exercise per Week: 2 days    Minutes of Exercise per Session: 20 min  Stress: No  Stress Concern Present (02/08/2021)   [employer] of Occupational Health - Occupational Stress Questionnaire    Feeling of Stress : Only a little  Social Connections: Moderately Integrated (02/08/2021)   Social Connection and Isolation Panel [NHANES]    Frequency of Communication with Friends and Family: More than three times a week    Frequency of Social Gatherings with Friends and Family: More than three times a week    Attends Religious Services: More than 4 times per year    Active Member of [employer] or Organizations: Yes    Attends Banker Meetings: More than 4 times per year    Marital Status: Never married  Intimate Partner Violence: Not At Risk (02/08/2021)   Humiliation, Afraid, Rape, and Kick questionnaire    Fear of Current or Ex-Partner: No    Emotionally Abused: No    Physically Abused: No    Sexually Abused: No    Outpatient Medications Prior to Visit  Medication Sig Dispense Refill   cyclobenzaprine (FLEXERIL) 10 MG tablet Take 1 tablet (10 mg total) by mouth 3 (three) times daily as needed for muscle spasms. 30 tablet 0   diclofenac Sodium (VOLTAREN) 1 % GEL See admin instructions.     hydroxychloroquine (PLAQUENIL) 200 MG tablet Take 2 tablets by mouth daily.     mometasone (NASONEX) 50 MCG/ACT nasal spray Place 1 spray into the nose at bedtime. 1 each 0   naproxen (NAPROSYN) 500 MG tablet Take 1 tablet (500 mg total) by mouth 2 (two) times daily. 20 tablet 0   ORENCIA CLICKJECT 125 MG/ML SOAJ Inject 1 pen into the skin once a week. Takes on Tuesdays     amLODipine (NORVASC) 10 MG tablet Take 1 tablet (10 mg total) by mouth daily. 90 tablet 1   hydrochlorothiazide (MICROZIDE) 12.5 MG capsule Take 1 capsule (12.5 mg total) by mouth daily. 90 capsule 1   labetalol (NORMODYNE) 200 MG tablet Take 1 tablet (200 mg total) by mouth 2 (two) times daily. 180 tablet 1   No facility-administered medications prior to visit.    Allergies  Allergen Reactions    Lisinopril Swelling    ROS Review of Systems  Constitutional:  Positive for fatigue. Negative for chills and fever.  HENT:  Negative for congestion, postnasal drip, sinus pressure and sinus pain.   Eyes:  Negative for pain and discharge.  Respiratory:  Negative for cough and shortness of breath.   Cardiovascular:  Negative for chest pain and palpitations.  Gastrointestinal:  Negative for abdominal pain, diarrhea, nausea and vomiting.  Endocrine: Negative for polydipsia and polyuria.  Genitourinary:  Negative for dysuria and hematuria.  Musculoskeletal:  Positive for arthralgias and back pain. Negative for neck  pain and neck stiffness.  Skin:  Negative for rash.  Neurological:  Negative for dizziness and weakness.  Psychiatric/Behavioral:  Negative for agitation and behavioral problems.       Objective:    Physical Exam Vitals reviewed.  Constitutional:      General: She is not in acute distress.    Appearance: She is not diaphoretic.  HENT:     Head: Normocephalic and atraumatic.     Nose: No congestion.     Mouth/Throat:     Mouth: Mucous membranes are moist.     Pharynx: No posterior oropharyngeal erythema.  Eyes:     General: No scleral icterus.    Extraocular Movements: Extraocular movements intact.  Cardiovascular:     Rate and Rhythm: Normal rate and regular rhythm.     Pulses: Normal pulses.     Heart sounds: Normal heart sounds. No murmur heard. Pulmonary:     Breath sounds: Normal breath sounds. No wheezing or rales.  Musculoskeletal:        General: Tenderness (Lumbar spine area and right paraspinal area) present.     Cervical back: Neck supple. No tenderness.     Right lower leg: No edema.     Left lower leg: No edema.  Skin:    General: Skin is warm.     Findings: No rash.  Neurological:     General: No focal deficit present.     Mental Status: She is alert and oriented to person, place, and time.  Psychiatric:        Mood and Affect: Mood normal.         Behavior: Behavior normal.     BP 135/85 (BP Location: Right Arm, Patient Position: Sitting, Cuff Size: Normal)   Pulse 79   Ht 5\' 5"  (1.651 m)   Wt 199 lb 9.6 oz (90.5 kg)   SpO2 98%   BMI 33.22 kg/m  Wt Readings from Last 3 Encounters:  09/27/22 199 lb 9.6 oz (90.5 kg)  07/26/22 198 lb 12.8 oz (90.2 kg)  07/14/22 198 lb (89.8 kg)    Lab Results  Component Value Date   TSH 0.699 07/19/2022   Lab Results  Component Value Date   WBC 5.9 06/13/2022   HGB 13.7 06/13/2022   HCT 42.7 06/13/2022   MCV 81.3 06/13/2022   PLT 252 06/13/2022   Lab Results  Component Value Date   NA 137 06/13/2022   K 3.2 (L) 06/13/2022   CO2 27 06/13/2022   GLUCOSE 82 06/13/2022   BUN 8 06/13/2022   CREATININE 0.63 06/13/2022   BILITOT 0.3 12/28/2020   ALKPHOS 148 (H) 12/28/2020   AST 12 12/28/2020   ALT 12 12/28/2020   PROT 6.7 12/28/2020   ALBUMIN 4.0 12/28/2020   CALCIUM 9.3 06/13/2022   ANIONGAP 8 06/13/2022   EGFR 101 12/28/2020   Lab Results  Component Value Date   CHOL 175 12/28/2020   Lab Results  Component Value Date   HDL 33 (L) 12/28/2020   Lab Results  Component Value Date   LDLCALC 125 (H) 12/28/2020   Lab Results  Component Value Date   TRIG 94 12/28/2020   Lab Results  Component Value Date   CHOLHDL 5.3 (H) 12/28/2020   No results found for: "HGBA1C"    Assessment & Plan:   Problem List Items Addressed This Visit       Cardiovascular and Mediastinum   Essential hypertension - Primary (Chronic)    BP Readings from  Last 1 Encounters:  09/27/22 135/85  Usually well-controlled with Amlodipine, HCTZ and Labetalol Counseled for compliance with the medications Advised DASH diet and moderate exercise/walking, at least 150 mins/week      Relevant Medications   labetalol (NORMODYNE) 200 MG tablet   hydrochlorothiazide (MICROZIDE) 12.5 MG capsule   amLODipine (NORVASC) 10 MG tablet   Other Relevant Orders   CMP14+EGFR   CBC with  Differential/Platelet     Endocrine   Toxic thyroid nodule    S/p RAI ablation Followed by endocrinology      Relevant Medications   labetalol (NORMODYNE) 200 MG tablet   Other Relevant Orders   TSH+T4F+T3Free     Musculoskeletal and Integument   Rheumatoid arthritis (HCC)    Followed by Rheumatology On Plaquenil and Orencia Needs to follow up with Ophthalmology        Other   Chronic fatigue    Could be due to multiple factors including RA, sleep disturbance, caregiver stress and beta-blocker Sleep study did not show OSA Checked TSH, vitamin D Review CBC and CMP from rheumatology office Needs to get colonoscopy Could be due to insomnia-added Elavil for insomnia, can be helpful for chronic fatigue/fibromyalgia as well      Primary insomnia    She has difficulty maintaining sleep, likely contributing to chronic fatigue Added Elavil for insomnia Sleep hygiene discussed, material provided      Relevant Medications   amitriptyline (ELAVIL) 10 MG tablet   Mixed hyperlipidemia    Check lipid profile Advised to follow DASH diet      Relevant Medications   labetalol (NORMODYNE) 200 MG tablet   hydrochlorothiazide (MICROZIDE) 12.5 MG capsule   amLODipine (NORVASC) 10 MG tablet   Other Relevant Orders   Lipid panel   Other Visit Diagnoses     Encounter for screening for HIV       Relevant Orders   HIV antibody (with reflex)   Need for hepatitis C screening test       Relevant Orders   Hepatitis C Antibody   Vitamin D deficiency       Relevant Orders   VITAMIN D 25 Hydroxy (Vit-D Deficiency, Fractures)   Prediabetes       Relevant Orders   Hemoglobin A1c   CMP14+EGFR   Colon cancer screening       Relevant Orders   Ambulatory referral to Gastroenterology   Need for shingles vaccine       Relevant Orders   Varicella-zoster vaccine IM (Completed)      Meds ordered this encounter  Medications   amitriptyline (ELAVIL) 10 MG tablet    Sig: Take 1 tablet  (10 mg total) by mouth at bedtime.    Dispense:  30 tablet    Refill:  3   labetalol (NORMODYNE) 200 MG tablet    Sig: Take 1 tablet (200 mg total) by mouth 2 (two) times daily.    Dispense:  180 tablet    Refill:  1   hydrochlorothiazide (MICROZIDE) 12.5 MG capsule    Sig: Take 1 capsule (12.5 mg total) by mouth daily.    Dispense:  90 capsule    Refill:  1   amLODipine (NORVASC) 10 MG tablet    Sig: Take 1 tablet (10 mg total) by mouth daily.    Dispense:  90 tablet    Refill:  1    Follow-up: Return in about 4 months (around 01/27/2023) for Annual physical.    Anabel Halon, MD

## 2022-09-29 ENCOUNTER — Encounter: Payer: Self-pay | Admitting: *Deleted

## 2022-09-30 DIAGNOSIS — F5101 Primary insomnia: Secondary | ICD-10-CM | POA: Insufficient documentation

## 2022-09-30 DIAGNOSIS — E782 Mixed hyperlipidemia: Secondary | ICD-10-CM | POA: Insufficient documentation

## 2022-09-30 NOTE — Assessment & Plan Note (Addendum)
She has difficulty maintaining sleep, likely contributing to chronic fatigue Added Elavil for insomnia Sleep hygiene discussed, material provided

## 2022-09-30 NOTE — Assessment & Plan Note (Addendum)
Could be due to multiple factors including RA, sleep disturbance, caregiver stress and beta-blocker Sleep study did not show OSA Checked TSH, vitamin D Review CBC and CMP from rheumatology office Needs to get colonoscopy Could be due to insomnia-added Elavil for insomnia, can be helpful for chronic fatigue/fibromyalgia as well

## 2022-09-30 NOTE — Assessment & Plan Note (Signed)
Check lipid profile Advised to follow DASH diet 

## 2022-11-23 ENCOUNTER — Encounter: Payer: Self-pay | Admitting: Emergency Medicine

## 2022-11-23 ENCOUNTER — Ambulatory Visit
Admission: EM | Admit: 2022-11-23 | Discharge: 2022-11-23 | Disposition: A | Discharge status: Home / Self Care | Payer: Medicaid Other | Attending: Family Medicine | Admitting: Family Medicine

## 2022-11-23 ENCOUNTER — Other Ambulatory Visit: Payer: Self-pay

## 2022-11-23 DIAGNOSIS — M79642 Pain in left hand: Secondary | ICD-10-CM

## 2022-11-23 MED ORDER — PREDNISONE 10 MG (21) PO TBPK: ORAL_TABLET | Freq: Every day | ORAL | 0 refills | Status: DC

## 2022-11-23 NOTE — ED Triage Notes (Signed)
Pt reports left hand swelling and "hard knot" to palm since Monday. Pt denies any known injury and reports history of arthritis.

## 2022-11-26 NOTE — ED Provider Notes (Signed)
[facility]   [id number] 11/23/22 Arrival Time: 1650  ASSESSMENT & PLAN:  1. Left hand pain    Ques neuroma of L hand. Recommend orthopaedic evaluation. No indication for plain imaging at this time. Trial of: Discharge Medication List as of 11/23/2022  5:16 PM     START taking these medications   Details  predniSONE (STERAPRED UNI-PAK 21 TAB) 10 MG (21) TBPK tablet Take by mouth daily. Take as directed., Starting Wed 11/23/2022, Normal       Recommend:  Follow-up Information     Oliver Barre, MD.   Specialties: Orthopedic Surgery, Sports Medicine Why: If worsening or failing to improve as anticipated. Contact information: [address] [place] [address] [phone]                 Reviewed expectations re: course of current medical issues. Questions answered. Outlined signs and symptoms indicating need for more acute intervention. Patient verbalized understanding. After Visit Summary given.  SUBJECTIVE: History from: patient. Alejandra Sanders is a 62 y.o. female who reports left hand swelling and "hard knot" to palm for sev days, maybe longer; does hurt with certain movements. Pt denies any known injury and reports history of arthritis. No tx PTA.   Past Surgical History:  Procedure Laterality Date   CHOLECYSTECTOMY N/A 06/11/2018   Procedure: LAPAROSCOPIC CHOLECYSTECTOMY;  Surgeon: Lucretia Roers, MD;  Location: [facility];  Service: General;  Laterality: N/A;   GALLBLADDER SURGERY  06/11/2018   THYROID SURGERY     removal of thyroid      OBJECTIVE:  Vitals:   11/23/22 1658  BP: 137/86  Pulse: 81  Resp: 20  Temp: 97.9 F (36.6 C)  TempSrc: Oral  SpO2: 94%    General appearance: alert; no distress HEENT: [place]; AT Neck: supple with FROM Resp: unlabored respirations Extremities: LUE: warm with well perfused appearance; well localized mild tenderness over left distal palm with slight firmness under skin around 1st or 2nd  metacarpal; without gross deformities; swelling: none; bruising: none; wrist and all fingers with FROM Skin: warm and dry; no visible rashes Psychological: alert and cooperative; normal mood and affect    Allergies  Allergen Reactions   Lisinopril Swelling    Past Medical History:  Diagnosis Date   Arthritis    Calculus of gallbladder with chronic cholecystitis without obstruction    Chronic cholecystitis 06/11/2018   Hypertension    Seizure (HCC)    had seizurea as child; unknow etiology; was on phenobarbital for a few years and then was taken off meds. No seizure in 40 years.   Thyroid disease    Trichimoniasis 02/11/2021   Treated with flagyl 02/11/21   Social History   Socioeconomic History   Marital status: Single    Spouse name: Not on file   Number of children: Not on file   Years of education: Not on file   Highest education level: Not on file  Occupational History   Not on file  Tobacco Use   Smoking status: Every Day    Current packs/day: 0.25    Average packs/day: 0.3 packs/day for 35.0 years (8.8 ttl pk-yrs)    Types: Cigarettes   Smokeless tobacco: Never   Tobacco comments:    states smokes 3 cig daily only  Substance and Sexual Activity   Alcohol use: No   Drug use: No   Sexual activity: Not Currently    Birth control/protection: Post-menopausal  Other Topics Concern   Not on  file  Social History Narrative   Not on file   Social Determinants of Health   Financial Resource Strain: Low Risk  (02/08/2021)   Overall Financial Resource Strain (CARDIA)    Difficulty of Paying Living Expenses: Not hard at all  Food Insecurity: No Food Insecurity (02/08/2021)   Hunger Vital Sign    Worried About Running Out of Food in the Last Year: Never true    Ran Out of Food in the Last Year: Never true  Transportation Needs: No Transportation Needs (02/08/2021)   PRAPARE - Administrator, Civil Service (Medical): No    Lack of Transportation  (Non-Medical): No  Physical Activity: Insufficiently Active (02/08/2021)   Exercise Vital Sign    Days of Exercise per Week: 2 days    Minutes of Exercise per Session: 20 min  Stress: No Stress Concern Present (02/08/2021)   [employer] of Occupational Health - Occupational Stress Questionnaire    Feeling of Stress : Only a little  Social Connections: Moderately Integrated (02/08/2021)   Social Connection and Isolation Panel [NHANES]    Frequency of Communication with Friends and Family: More than three times a week    Frequency of Social Gatherings with Friends and Family: More than three times a week    Attends Religious Services: More than 4 times per year    Active Member of Clubs or Organizations: Yes    Attends Engineer, structural: More than 4 times per year    Marital Status: Never married   Family History  Problem Relation Age of Onset   Aneurysm Maternal Grandmother    Hypertension Father    Hypertension Mother    Diabetes Mother    Heart attack Brother    Hypertension Sister    Kidney failure Sister    Hypertension Sister    Hypertension Sister    Hypertension Son    Past Surgical History:  Procedure Laterality Date   CHOLECYSTECTOMY N/A 06/11/2018   Procedure: LAPAROSCOPIC CHOLECYSTECTOMY;  Surgeon: Lucretia Roers, MD;  Location: [facility];  Service: General;  Laterality: N/A;   GALLBLADDER SURGERY  06/11/2018   THYROID SURGERY     removal of thyroid       Mardella Layman, MD 11/26/22 1141

## 2022-11-29 DIAGNOSIS — M0579 Rheumatoid arthritis with rheumatoid factor of multiple sites without organ or systems involvement: Secondary | ICD-10-CM | POA: Diagnosis not present

## 2022-11-29 DIAGNOSIS — R5383 Other fatigue: Secondary | ICD-10-CM | POA: Diagnosis not present

## 2023-01-17 ENCOUNTER — Ambulatory Visit: Payer: Medicaid Other | Admitting: Internal Medicine

## 2023-01-22 ENCOUNTER — Emergency Department (HOSPITAL_COMMUNITY)
Admission: EM | Admit: 2023-01-22 | Discharge: 2023-01-22 | Disposition: A | Discharge status: Home / Self Care | Payer: Medicaid Other | Attending: Emergency Medicine | Admitting: Emergency Medicine

## 2023-01-22 ENCOUNTER — Other Ambulatory Visit: Payer: Self-pay

## 2023-01-22 ENCOUNTER — Encounter (HOSPITAL_COMMUNITY): Payer: Self-pay

## 2023-01-22 ENCOUNTER — Emergency Department (HOSPITAL_COMMUNITY): Payer: Medicaid Other

## 2023-01-22 DIAGNOSIS — M5459 Other low back pain: Secondary | ICD-10-CM | POA: Diagnosis not present

## 2023-01-22 DIAGNOSIS — Z79899 Other long term (current) drug therapy: Secondary | ICD-10-CM | POA: Diagnosis not present

## 2023-01-22 DIAGNOSIS — M545 Low back pain, unspecified: Secondary | ICD-10-CM | POA: Diagnosis present

## 2023-01-22 DIAGNOSIS — I10 Essential (primary) hypertension: Secondary | ICD-10-CM | POA: Diagnosis not present

## 2023-01-22 MED ORDER — METHOCARBAMOL 500 MG PO TABS: 500.0000 mg | ORAL_TABLET | Freq: Four times a day (QID) | ORAL | 0 refills | Status: DC | PRN

## 2023-01-22 MED ORDER — KETOROLAC TROMETHAMINE 15 MG/ML IJ SOLN
15.0000 mg | Freq: Once | INTRAMUSCULAR | Status: AC
  Administered 2023-01-22: 15 mg via INTRAMUSCULAR
  Filled 2023-01-22: qty 1

## 2023-01-22 MED ORDER — METHOCARBAMOL 500 MG PO TABS
500.0000 mg | ORAL_TABLET | Freq: Once | ORAL | Status: AC
  Administered 2023-01-22: 500 mg via ORAL
  Filled 2023-01-22: qty 1

## 2023-01-22 NOTE — ED Provider Notes (Signed)
[place] EMERGENCY DEPARTMENT AT [facility] Provider Note   CSN: [id number] Arrival date & time: 01/22/23  1315     History  Chief Complaint  Patient presents with   Back Pain    Alejandra Sanders is a 62 y.o. female.  He has past history of hypertension, obesity, sleep apnea.  Presents to ER for left low back pain x 3 days with no injury or trauma, no numbness tingling or weakness pain does not radiate.  She states it hurts worse to sit down as well as to walk or bend over.  She noticed the pain while sitting down at [employer]ingo.  Pain gets better when she takes naproxen but she states she did not want to keep "masking the pain" to be seen in the ER because pain was not getting better.  She has a PCP appointment scheduled for this coming Friday but she states her son wanted her to get checked out before then.  She denies saddle seizure paresthesia, no bowel or bladder incontinence.   Back Pain      Home Medications Prior to Admission medications   Medication Sig Start Date End Date Taking? Authorizing Provider  methocarbamol (ROBAXIN) 500 MG tablet Take 1 tablet (500 mg total) by mouth every 6 (six) hours as needed for muscle spasms. 01/22/23  Yes Calyb Mcquarrie A, PA-C  amitriptyline (ELAVIL) 10 MG tablet Take 1 tablet (10 mg total) by mouth at bedtime. 09/27/22   Anabel Halon, MD  amLODipine (NORVASC) 10 MG tablet Take 1 tablet (10 mg total) by mouth daily. 09/27/22   Anabel Halon, MD  diclofenac Sodium (VOLTAREN) 1 % GEL See admin instructions. 10/17/16   [provider]  hydrochlorothiazide (MICROZIDE) 12.5 MG capsule Take 1 capsule (12.5 mg total) by mouth daily. 09/27/22   Anabel Halon, MD  hydroxychloroquine (PLAQUENIL) 200 MG tablet Take 2 tablets by mouth daily. 04/17/18   [provider]  labetalol (NORMODYNE) 200 MG tablet Take 1 tablet (200 mg total) by mouth 2 (two) times daily. 09/27/22   Anabel Halon, MD  mometasone (NASONEX) 50 MCG/ACT  nasal spray Place 1 spray into the nose at bedtime. 07/07/22   Oretha Milch, MD  naproxen (NAPROSYN) 500 MG tablet Take 1 tablet (500 mg total) by mouth 2 (two) times daily. 06/13/22   Idol, Raynelle Fanning, PA-C  ORENCIA CLICKJECT 125 MG/ML SOAJ Inject 1 pen into the skin once a week. Takes on Tuesdays 06/26/18   Lucretia Roers, MD  predniSONE (STERAPRED UNI-PAK 21 TAB) 10 MG (21) TBPK tablet Take by mouth daily. Take as directed. 11/23/22   Mardella Layman, MD      Allergies    Lisinopril    Review of Systems   Review of Systems  Musculoskeletal:  Positive for back pain.    Physical Exam Updated Vital Signs BP (!) 120/91   Pulse 91   Temp 98.5 F (36.9 C) (Oral)   Resp 18   Wt 91.6 kg   SpO2 100%   BMI 33.61 kg/m  Physical Exam Vitals and nursing note reviewed.  Constitutional:      General: She is not in acute distress.    Appearance: She is well-developed.  HENT:     Head: Normocephalic and atraumatic.     Nose: Nose normal.     Mouth/Throat:     Mouth: Mucous membranes are moist.  Eyes:     Conjunctiva/sclera: Conjunctivae normal.  Cardiovascular:     Rate  and Rhythm: Normal rate and regular rhythm.     Heart sounds: No murmur heard. Pulmonary:     Effort: Pulmonary effort is normal. No respiratory distress.     Breath sounds: Normal breath sounds.  Abdominal:     Palpations: Abdomen is soft.     Tenderness: There is no abdominal tenderness.  Musculoskeletal:        General: No swelling.     Cervical back: Neck supple.     Lumbar back: No swelling or edema. Negative right straight leg raise test and negative left straight leg raise test.       Back:     Comments: Left lumbar paraspinous tenderness no swelling in the area or overlying skin changes.  Flexion of spine limited due to pain, patient cannot touch her toes.  Skin:    General: Skin is warm and dry.     Capillary Refill: Capillary refill takes less than 2 seconds.  Neurological:     General: No focal deficit  present.     Mental Status: She is alert and oriented to person, place, and time.     Cranial Nerves: No cranial nerve deficit.     Motor: No weakness.     Deep Tendon Reflexes:     Reflex Scores:      Patellar reflexes are 2+ on the right side and 2+ on the left side.      Achilles reflexes are 2+ on the right side and 2+ on the left side. Psychiatric:        Mood and Affect: Mood normal.     ED Results / Procedures / Treatments   Labs (all labs ordered are listed, but only abnormal results are displayed) Labs Reviewed - No data to display  EKG None  Radiology DG Lumbar Spine Complete  Result Date: 01/22/2023 CLINICAL DATA:  Central low back pain.  No known trauma. EXAM: LUMBAR SPINE - COMPLETE 4+ VIEW COMPARISON:  02/17/2021. FINDINGS: There are 5 nonrib-bearing lumbar vertebrae. Anatomic lumbar curvature. No spondylolysis or spondylolisthesis. Vertebral body heights are maintained. No aggressive osseous lesion. Mild multilevel degenerative changes in the form of facet arthropathy and marginal osteophyte formation. Sacroiliac joints are symmetric. There are surgical clips in the right upper quadrant, typical of a previous cholecystectomy. Visualized soft tissues are otherwise within normal limits. IMPRESSION: *Mild multilevel degenerative joint disease. Electronically Signed   By: Jules Schick M.D.   On: 01/22/2023 15:26    Procedures Procedures    Medications Ordered in ED Medications  ketorolac (TORADOL) 15 MG/ML injection 15 mg (15 mg Intramuscular Given 01/22/23 1511)  methocarbamol (ROBAXIN) tablet 500 mg (500 mg Oral Given 01/22/23 1510)    ED Course/ Medical Decision Making/ A&P                                 Medical Decision Making This patient presents to the ED for concern of back pain this involves an extensive number of treatment options, and is a complaint that carries with it a high risk of complications and morbidity.  The differential diagnosis includes  sprain, strain, HNP, fracture, DDD, muscle spasm, cauda equina, epidural abscess or hematoma, malignancy, other   Co morbidities that complicate the patient evaluation  htn   Additional history obtained:  Additional history obtained from EMR External records from outside source obtained and reviewed including notes    Imaging Studies ordered:  I ordered imaging  studies including stray lumbar spine I independently visualized and interpreted imaging which showed fracture or traumatic malalignment, degenerative facet arthropathy I agree with the radiologist interpretation      Problem List / ED Course / Critical interventions / Medication management  Patient presents with low back pain x 3 days with pain on the left lumbar area. Differential considered as above. Symptoms are likely due to degenerative changes versus muscle spasm. They have no high risk features including saddle anesthesia/paresthesia, bowel or bladder incontinence, urinary retention, fever, weight loss, history of cancer or immune suppression, or IV drug use. We discussed plan to treat symptoms with NSAIDs and muscle relaxers, and follow up with PCP as scheduled on Friday. They were advised on strict return precautions.  I ordered medication including Toradol and Robaxin for low back pain Reevaluation of the patient after these medicines showed that the patient improved I have reviewed the patients home medicines and have made adjustments as needed       Amount and/or Complexity of Data Reviewed Radiology: ordered.  Risk Prescription drug management.           Final Clinical Impression(s) / ED Diagnoses Final diagnoses:  Acute left-sided low back pain without sciatica    Rx / DC Orders ED Discharge Orders          Ordered    methocarbamol (ROBAXIN) 500 MG tablet  Every 6 hours PRN        01/22/23 [address], [place] A, PA-C 01/22/23 1539    Sloan Leiter,  DO 01/24/23 [id number]

## 2023-01-22 NOTE — Discharge Instructions (Addendum)
It was a pleasure taking care of you today.  You are seen in the ER for low back pain.  Your x-rays show to have changes in your back but there are no fractures.  You can continue taking the Naprosyn you have at home for pain and I am prescribing a muscle relaxer Robaxin which you received in the ER.  This can make you sleepy so do not take it when driving or operating machinery.  Come back to the ER if you have new or worsening symptoms.  Follow-up with your doctor as scheduled on Friday.

## 2023-01-22 NOTE — ED Triage Notes (Signed)
C/o left lower back pain with sitting, bending down, walking up steps, and reaching up to grab something x3 days Pain radiates down into left buttocks.  Ambulatory to triage.

## 2023-01-23 DIAGNOSIS — E559 Vitamin D deficiency, unspecified: Secondary | ICD-10-CM | POA: Diagnosis not present

## 2023-01-23 DIAGNOSIS — R7303 Prediabetes: Secondary | ICD-10-CM | POA: Diagnosis not present

## 2023-01-23 DIAGNOSIS — Z1159 Encounter for screening for other viral diseases: Secondary | ICD-10-CM | POA: Diagnosis not present

## 2023-01-23 DIAGNOSIS — I10 Essential (primary) hypertension: Secondary | ICD-10-CM | POA: Diagnosis not present

## 2023-01-23 DIAGNOSIS — Z114 Encounter for screening for human immunodeficiency virus [HIV]: Secondary | ICD-10-CM | POA: Diagnosis not present

## 2023-01-23 DIAGNOSIS — E782 Mixed hyperlipidemia: Secondary | ICD-10-CM | POA: Diagnosis not present

## 2023-01-23 DIAGNOSIS — E0510 Thyrotoxicosis with toxic single thyroid nodule without thyrotoxic crisis or storm: Secondary | ICD-10-CM | POA: Diagnosis not present

## 2023-01-24 LAB — CMP14+EGFR
ALT: 18 [IU]/L (ref 0–32)
AST: 17 [IU]/L (ref 0–40)
Albumin: 4.1 g/dL (ref 3.9–4.9)
Alkaline Phosphatase: 134 [IU]/L — ABNORMAL HIGH (ref 44–121)
BUN/Creatinine Ratio: 21 (ref 12–28)
BUN: 12 mg/dL (ref 8–27)
Bilirubin Total: 0.4 mg/dL (ref 0.0–1.2)
CO2: 24 mmol/L (ref 20–29)
Calcium: 9.2 mg/dL (ref 8.7–10.3)
Chloride: 104 mmol/L (ref 96–106)
Creatinine, Ser: 0.58 mg/dL (ref 0.57–1.00)
Globulin, Total: 2.8 g/dL (ref 1.5–4.5)
Glucose: 91 mg/dL (ref 70–99)
Potassium: 4.3 mmol/L (ref 3.5–5.2)
Sodium: 142 mmol/L (ref 134–144)
Total Protein: 6.9 g/dL (ref 6.0–8.5)
eGFR: 103 mL/min/{1.73_m2} (ref >59)

## 2023-01-24 LAB — LIPID PANEL
Chol/HDL Ratio: 5.1 {ratio} — ABNORMAL HIGH (ref 0.0–4.4)
Cholesterol, Total: 189 mg/dL (ref 100–199)
HDL: 37 mg/dL — ABNORMAL LOW (ref >39)
LDL Chol Calc (NIH): 132 mg/dL — ABNORMAL HIGH (ref 0–99)
Triglycerides: 112 mg/dL (ref 0–149)
VLDL Cholesterol Cal: 20 mg/dL (ref 5–40)

## 2023-01-24 LAB — HEMOGLOBIN A1C
Est. average glucose Bld gHb Est-mCnc: 126 mg/dL
Hgb A1c MFr Bld: 6 % — ABNORMAL HIGH (ref 4.8–5.6)

## 2023-01-24 LAB — CBC WITH DIFFERENTIAL/PLATELET
Basophils Absolute: 0 10*3/uL (ref 0.0–0.2)
Basos: 1 %
EOS (ABSOLUTE): 0.2 10*3/uL (ref 0.0–0.4)
Eos: 3 %
Hematocrit: 41.2 % (ref 34.0–46.6)
Hemoglobin: 13.5 g/dL (ref 11.1–15.9)
Immature Grans (Abs): 0 10*3/uL (ref 0.0–0.1)
Immature Granulocytes: 0 %
Lymphocytes Absolute: 2.7 10*3/uL (ref 0.7–3.1)
Lymphs: 45 %
MCH: 26.6 pg (ref 26.6–33.0)
MCHC: 32.8 g/dL (ref 31.5–35.7)
MCV: 81 fL (ref 79–97)
Monocytes Absolute: 0.3 10*3/uL (ref 0.1–0.9)
Monocytes: 6 %
Neutrophils Absolute: 2.7 10*3/uL (ref 1.4–7.0)
Neutrophils: 45 %
Platelets: 295 10*3/uL (ref 150–450)
RBC: 5.08 x10E6/uL (ref 3.77–5.28)
RDW: 14.5 % (ref 11.7–15.4)
WBC: 5.9 10*3/uL (ref 3.4–10.8)

## 2023-01-24 LAB — VITAMIN D 25 HYDROXY (VIT D DEFICIENCY, FRACTURES): Vit D, 25-Hydroxy: 9.8 ng/mL — ABNORMAL LOW (ref 30.0–100.0)

## 2023-01-24 LAB — HEPATITIS C ANTIBODY: Hep C Virus Ab: NONREACTIVE

## 2023-01-24 LAB — HIV ANTIBODY (ROUTINE TESTING W REFLEX): HIV Screen 4th Generation wRfx: NONREACTIVE

## 2023-01-24 LAB — TSH+T4F+T3FREE
Free T4: 1.28 ng/dL (ref 0.82–1.77)
T3, Free: 3.4 pg/mL (ref 2.0–4.4)
TSH: 1.07 u[IU]/mL (ref 0.450–4.500)

## 2023-01-25 ENCOUNTER — Ambulatory Visit: Payer: Medicaid Other | Admitting: Nurse Practitioner

## 2023-01-25 ENCOUNTER — Encounter: Payer: Self-pay | Admitting: Nurse Practitioner

## 2023-01-25 VITALS — BP 136/82 | HR 90 | Ht 65.0 in | Wt 203.5 lb

## 2023-01-25 DIAGNOSIS — E042 Nontoxic multinodular goiter: Secondary | ICD-10-CM

## 2023-01-25 DIAGNOSIS — E0510 Thyrotoxicosis with toxic single thyroid nodule without thyrotoxic crisis or storm: Secondary | ICD-10-CM

## 2023-01-25 DIAGNOSIS — E890 Postprocedural hypothyroidism: Secondary | ICD-10-CM

## 2023-01-25 NOTE — Progress Notes (Signed)
01/25/2023     Endocrinology Follow Up Note    Subjective:    Patient ID: Alejandra Sanders, female    DOB: 1960/06/23, PCP Anabel Halon, MD.   Past Medical History:  Diagnosis Date   Arthritis    Calculus of gallbladder with chronic cholecystitis without obstruction    Chronic cholecystitis 06/11/2018   Hypertension    Seizure (HCC)    had seizurea as child; unknow etiology; was on phenobarbital for a few years and then was taken off meds. No seizure in 40 years.   Thyroid disease    Trichimoniasis 02/11/2021   Treated with flagyl 02/11/21    Past Surgical History:  Procedure Laterality Date   CHOLECYSTECTOMY N/A 06/11/2018   Procedure: LAPAROSCOPIC CHOLECYSTECTOMY;  Surgeon: Lucretia Roers, MD;  Location: [facility];  Service: General;  Laterality: N/A;   GALLBLADDER SURGERY  06/11/2018   THYROID SURGERY     removal of thyroid    Social History   Socioeconomic History   Marital status: Single    Spouse name: Not on file   Number of children: Not on file   Years of education: Not on file   Highest education level: Not on file  Occupational History   Not on file  Tobacco Use   Smoking status: Every Day    Current packs/day: 0.25    Average packs/day: 0.3 packs/day for 35.0 years (8.8 ttl pk-yrs)    Types: Cigarettes   Smokeless tobacco: Never   Tobacco comments:    states smokes 3 cig daily only  Substance and Sexual Activity   Alcohol use: No   Drug use: No   Sexual activity: Not Currently    Birth control/protection: Post-menopausal  Other Topics Concern   Not on file  Social History Narrative   Not on file   Social Determinants of Health   Financial Resource Strain: Low Risk  (02/08/2021)   Overall Financial Resource Strain (CARDIA)    Difficulty of Paying Living Expenses: Not hard at all  Food Insecurity: No Food Insecurity (02/08/2021)   Hunger Vital Sign    Worried About Running Out of Food in the Last Year: Never true    Ran Out of  Food in the Last Year: Never true  Transportation Needs: No Transportation Needs (02/08/2021)   PRAPARE - Administrator, Civil Service (Medical): No    Lack of Transportation (Non-Medical): No  Physical Activity: Insufficiently Active (02/08/2021)   Exercise Vital Sign    Days of Exercise per Week: 2 days    Minutes of Exercise per Session: 20 min  Stress: No Stress Concern Present (02/08/2021)   [employer] of Occupational Health - Occupational Stress Questionnaire    Feeling of Stress : Only a little  Social Connections: Moderately Integrated (02/08/2021)   Social Connection and Isolation Panel [NHANES]    Frequency of Communication with Friends and Family: More than three times a week    Frequency of Social Gatherings with Friends and Family: More than three times a week    Attends Religious Services: More than 4 times per year    Active Member of [employer] or Organizations: Yes    Attends Engineer, structural: More than 4 times per year    Marital Status: Never married    Family History  Problem Relation Age of Onset   Aneurysm Maternal Grandmother    Hypertension Father    Hypertension Mother    Diabetes Mother  Heart attack Brother    Hypertension Sister    Kidney failure Sister    Hypertension Sister    Hypertension Sister    Hypertension Son     Outpatient Encounter Medications as of 01/25/2023  Medication Sig   amitriptyline (ELAVIL) 10 MG tablet Take 1 tablet (10 mg total) by mouth at bedtime.   amLODipine (NORVASC) 10 MG tablet Take 1 tablet (10 mg total) by mouth daily.   diclofenac Sodium (VOLTAREN) 1 % GEL See admin instructions.   hydrochlorothiazide (MICROZIDE) 12.5 MG capsule Take 1 capsule (12.5 mg total) by mouth daily.   hydroxychloroquine (PLAQUENIL) 200 MG tablet Take 2 tablets by mouth daily.   labetalol (NORMODYNE) 200 MG tablet Take 1 tablet (200 mg total) by mouth 2 (two) times daily.   methocarbamol (ROBAXIN) 500 MG tablet  Take 1 tablet (500 mg total) by mouth every 6 (six) hours as needed for muscle spasms.   mometasone (NASONEX) 50 MCG/ACT nasal spray Place 1 spray into the nose at bedtime.   naproxen (NAPROSYN) 500 MG tablet Take 1 tablet (500 mg total) by mouth 2 (two) times daily.   ORENCIA CLICKJECT 125 MG/ML SOAJ Inject 1 pen into the skin once a week. Takes on Tuesdays   predniSONE (STERAPRED UNI-PAK 21 TAB) 10 MG (21) TBPK tablet Take by mouth daily. Take as directed.   No facility-administered encounter medications on file as of 01/25/2023.    ALLERGIES: Allergies  Allergen Reactions   Lisinopril Swelling    VACCINATION STATUS: Immunization History  Administered Date(s) Administered   Influenza,inj,Quad PF,6+ Mos 12/15/2020, 01/27/2022   Moderna SARS-COV2 Booster Vaccination 06/14/2019, 02/22/2020   Moderna Sars-Covid-2 Vaccination 05/16/2019   Tdap 09/03/2022   Zoster Recombinant(Shingrix) 09/27/2022     HPI  Alejandra Sanders is 62 y.o. female who presents today with a medical history as above. she is being seen in follow up after being seen in consultation for hyperthyroidism requested by Anabel Halon, MD.  She has a history of partial thyroidectomy as a result of large goiter back in 2001.  She never needed thyroid hormone replacement after her surgery.  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  she denies dysphagia, choking, shortness of breath, no recent voice change.    she does have family history of thyroid dysfunction in her sister and cousin (both requiring surgery 1 with goiter the other with hyperthyroidism), but denies family hx of thyroid cancer.  she is not on any anti-thyroid medications nor on any thyroid hormone supplements. Denies use of Biotin containing supplements.   Review of systems  Constitutional: + Minimally fluctuating body weight,  current Body mass index is 33.86 kg/m. , no fatigue, no subjective hyperthermia, no subjective hypothermia Eyes: no blurry  vision, no xerophthalmia ENT: no sore throat, no nodules palpated in throat, no dysphagia/odynophagia, no hoarseness Cardiovascular: no chest pain, no shortness of breath, no palpitations, no leg swelling Respiratory: no cough, no shortness of breath Gastrointestinal: no nausea/vomiting/diarrhea Musculoskeletal: no muscle/joint aches Skin: no rashes, no hyperemia Neurological: no tremors, no numbness, no tingling, no dizziness Psychiatric: no depression, no anxiety   Objective:    BP 136/82 (BP Location: Right Arm, Patient Position: Sitting, Cuff Size: Large)   Pulse 90   Ht 5\' 5"  (1.651 m)   Wt 203 lb 8 oz (92.3 kg)   BMI 33.86 kg/m   Wt Readings from Last 3 Encounters:  01/25/23 203 lb 8 oz (92.3 kg)  01/22/23 202 lb (91.6 kg)  09/27/22 199 lb 9.6 oz (90.5 kg)     BP Readings from Last 3 Encounters:  01/25/23 136/82  01/22/23 122/88  11/23/22 137/86         Physical Exam- Limited  Constitutional:  Body mass index is 33.86 kg/m. , not in acute distress, normal state of mind Eyes:  EOMI, no exophthalmos Neck: Supple, surgical scar barely visible from partial thyroidectomy Musculoskeletal: no gross deformities, strength intact in all four extremities, no gross restriction of joint movements Skin:  no rashes, no hyperemia Neurological: no tremor with outstretched hands   CMP     Component Value Date/Time   NA 142 01/23/2023 1054   K 4.3 01/23/2023 1054   CL 104 01/23/2023 1054   CO2 24 01/23/2023 1054   GLUCOSE 91 01/23/2023 1054   GLUCOSE 82 06/13/2022 1706   BUN 12 01/23/2023 1054   CREATININE 0.58 01/23/2023 1054   CALCIUM 9.2 01/23/2023 1054   PROT 6.9 01/23/2023 1054   ALBUMIN 4.1 01/23/2023 1054   AST 17 01/23/2023 1054   ALT 18 01/23/2023 1054   ALKPHOS 134 (H) 01/23/2023 1054   BILITOT 0.4 01/23/2023 1054   GFRNONAA >60 06/13/2022 1706   GFRAA >60 05/27/2018 1528     CBC    Component Value Date/Time   WBC 5.9 01/23/2023 1054   WBC 5.9  06/13/2022 1706   RBC 5.08 01/23/2023 1054   RBC 5.25 (H) 06/13/2022 1706   HGB 13.5 01/23/2023 1054   HCT 41.2 01/23/2023 1054   PLT 295 01/23/2023 1054   MCV 81 01/23/2023 1054   MCH 26.6 01/23/2023 1054   MCH 26.1 06/13/2022 1706   MCHC 32.8 01/23/2023 1054   MCHC 32.1 06/13/2022 1706   RDW 14.5 01/23/2023 1054   LYMPHSABS 2.7 01/23/2023 1054   MONOABS 0.4 06/13/2022 1706   EOSABS 0.2 01/23/2023 1054   BASOSABS 0.0 01/23/2023 1054     Diabetic Labs (most recent): Lab Results  Component Value Date   HGBA1C 6.0 (H) 01/23/2023    Lipid Panel     Component Value Date/Time   CHOL 189 01/23/2023 1054   TRIG 112 01/23/2023 1054   HDL 37 (L) 01/23/2023 1054   CHOLHDL 5.1 (H) 01/23/2023 1054   LDLCALC 132 (H) 01/23/2023 1054   LABVLDL 20 01/23/2023 1054     Lab Results  Component Value Date   TSH 1.070 01/23/2023   TSH 0.699 07/19/2022   TSH 0.888 01/19/2022   TSH 1.550 09/16/2021   TSH 1.170 05/26/2021   TSH 2.120 03/25/2021   TSH 0.048 (L) 01/07/2021   TSH 0.053 (L) 12/28/2020   FREET4 1.28 01/23/2023   FREET4 1.23 07/19/2022   FREET4 1.32 01/19/2022   FREET4 1.19 09/16/2021   FREET4 1.17 05/26/2021   FREET4 1.00 03/25/2021   FREET4 1.32 01/07/2021     US Thyroid 01/08/21 CLINICAL DATA:  62 year old female with a history hyperthyroidism   EXAM: THYROID ULTRASOUND   TECHNIQUE: Ultrasound examination of the thyroid gland and adjacent soft tissues was performed.   COMPARISON:  None.   FINDINGS: Parenchymal Echotexture: Markedly heterogenous   Isthmus: 0.8 cm   Right lobe: 4.8 cm x 2.4 cm x 2.5 cm   Left lobe: 5.9 cm x 3.3 cm x 4.4 cm   _________________________________________________________   Estimated total number of nodules >/= 1 cm: 6-10   Number of spongiform nodules >/=  2 cm not described below (TR1): 0   Number of mixed cystic and solid nodules >/= 1.5  cm not described below (TR2): 0    _________________________________________________________   Nodule # 1:   Location: Isthmus; mid   Maximum size: 1.3 cm; Other 2 dimensions: 0.9 cm x 0.7 cm   Composition: cannot determine (2)   Echogenicity: isoechoic (1)   Shape: not taller-than-wide (0)   Margins: ill-defined (0)   Echogenic foci: none (0)   ACR TI-RADS total points: 3.   ACR TI-RADS risk category: TR3 (3 points).   ACR TI-RADS recommendations:   Nodule does not meet criteria for surveillance or biopsy   _________________________________________________________   Nodule # 2:   Location: Right; Superior   Maximum size: 3.0 cm; Other 2 dimensions: 2.1 cm x 2.0 cm   Composition: solid/almost completely solid (2)   Echogenicity: isoechoic (1)   Shape: not taller-than-wide (0)   Margins: ill-defined (0)   Echogenic foci: none (0)   ACR TI-RADS total points: 3.   ACR TI-RADS risk category: TR3 (3 points).   ACR TI-RADS recommendations:   Nodule meets criteria for biopsy   _________________________________________________________   Nodule # 3:   Location: Right; Mid   Maximum size: 2.0 cm; Other 2 dimensions: 1.9 cm x 1.5 cm   Composition: cannot determine (2)   Echogenicity: isoechoic (1)   Shape: not taller-than-wide (0)   Margins: ill-defined (0)   Echogenic foci: none (0)   ACR TI-RADS total points: 3.   ACR TI-RADS risk category: TR3 (3 points).   ACR TI-RADS recommendations:   Nodule meets criteria for surveillance   _________________________________________________________   Nodule # 4:   Location: Left; Superior   Maximum size: 1.7 cm; Other 2 dimensions: 1.7 cm x 1.5 cm   Composition: solid/almost completely solid (2)   Echogenicity: isoechoic (1)   Shape: not taller-than-wide (0)   Margins: smooth (0)   Echogenic foci: none (0)   ACR TI-RADS total points: 3.   ACR TI-RADS risk category: TR3 (3 points).   ACR TI-RADS recommendations:   Nodule  meets criteria for surveillance   _________________________________________________________   Nodule # 5:   Location: Left; Mid   Maximum size: 2.3 cm; Other 2 dimensions: 2.0 cm x 1.8 cm   Composition: solid/almost completely solid (2)   Echogenicity: hyperechoic (1)   Shape: not taller-than-wide (0)   Margins: ill-defined (0)   Echogenic foci: none (0)   ACR TI-RADS total points: 3.   ACR TI-RADS risk category: TR3 (3 points).   ACR TI-RADS recommendations:   Nodule meets criteria for surveillance   _________________________________________________________   Nodule # 6:   Location: Left; Mid   Maximum size: 1.8 cm; Other 2 dimensions: 1.5 cm x 1.4 cm   Composition: mixed cystic and solid (1)   Echogenicity: isoechoic (1)   Shape: not taller-than-wide (0)   Margins: ill-defined (0)   Echogenic foci: none (0)   ACR TI-RADS total points: 2.   ACR TI-RADS risk category: TR2 (2 points).   ACR TI-RADS recommendations:   Cystic nodule does not meet criteria for surveillance or biopsy   _________________________________________________________   Nodule # 7:   Location: Left; Inferior   Maximum size: 2.4 cm; Other 2 dimensions: 2.4 cm x 2.1 cm   Composition: solid/almost completely solid (2)   Echogenicity: isoechoic (1)   Shape: not taller-than-wide (0)   Margins: ill-defined (0)   Echogenic foci: none (0)   ACR TI-RADS total points: 3.   ACR TI-RADS risk category: TR3 (3 points).   ACR TI-RADS recommendations:   Nodule  meets criteria for surveillance   _________________________________________________________   No adenopathy   IMPRESSION: Multinodular thyroid.   Right superior thyroid nodule (labeled 2, 3.0 cm, TR 3) meets criteria for biopsy, as designated by the newly established ACR TI-RADS criteria, and referral for biopsy is recommended.   Right inferior thyroid nodule (labeled 3), left superior thyroid nodules (labeled 4 and  labeled 5), and left inferior thyroid nodule (labeled 7) all meet criteria for surveillance, as designated by the newly established ACR TI-RADS criteria. Surveillance ultrasound study recommended to be performed annually up to 5 years.   Recommendations follow those established by the new ACR TI-RADS criteria (J Am Coll Radiol 2017;14:587-595).     Electronically Signed   By: Gilmer Mor D.O.   On: 01/08/2021 14:12 --------------------------------------------------------------------------------------------------------------  Uptake and scan 01/26/21 CLINICAL DATA:  Suppressed TSH, prior partial thyroidectomy, multinodular goiter question toxic nodule, hyperthyroidism   EXAM: THYROID SCAN AND UPTAKE - 4 AND 24 HOURS   TECHNIQUE: Following oral administration of I-123 capsule, anterior planar imaging was acquired at 24 hours. Thyroid uptake was calculated with a thyroid probe at 4-6 hours and 24 hours.   RADIOPHARMACEUTICALS:  315 uCi I-123 sodium iodide p.o.   COMPARISON:  Thyroid ultrasound 01/08/2021   FINDINGS: Focal hot nodule in the inferior pole of LEFT thyroid lobe consistent with toxic adenoma.   This corresponds to a 2.5 cm diameter nodule on ultrasound at the inferior pole.   Suppression of uptake in remaining thyroid lobes.   No additional warm nodules.   Subtle areas of decreased tracer uptake, may represent suppression or subtle nodules.   4 hour I-123 uptake = 6.3 % (normal 5-20%)   24 hour I-123 uptake = 14.8% (normal 10-30%)   IMPRESSION: Normal 4 hour and 24 hour radio iodine uptakes.   Hot nodule inferior pole LEFT thyroid lobe corresponding to a 2.5 cm diameter nodule seen on prior ultrasound consistent with toxic adenoma.   Suppression of uptake in remaining thyroid tissue.     Electronically Signed   By: Ulyses Southward M.D.   On: 01/27/2021 16:01   Latest Reference Range & Units 03/25/21 08:41 05/26/21 08:30 09/16/21 08:57 01/19/22  09:15 07/19/22 08:57 01/23/23 10:54  TSH 0.450 - 4.500 uIU/mL 2.120 1.170 1.550 0.888 0.699 1.070  Triiodothyronine,Free,Serum 2.0 - 4.4 pg/mL 3.1 3.1 3.8 3.3 3.3 3.4  T4,Free(Direct) 0.82 - 1.77 ng/dL 1.91 4.78 2.95 6.21 3.08 1.28    Assessment & Plan:   1. 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  Her uptake and scan showed hot nodule in left inferior pole, consistent with toxic thyroid nodule.   -She had RAI ablation on 02/11/21.   -Her repeat thyroid function tests show euthyroid presentation.  She does not need thyroid hormone supplement or antithyroid medications at this time. There is a possibility that she will not need thyroid hormone replacement in the future.  Will repeat thyroid function tests in 1 year for surveillance.   -Patient is advised to maintain close follow up with Anabel Halon, MD for primary care needs.     I spent  14  minutes in the care of the patient today including review of labs from Thyroid Function, CMP, and other relevant labs ; imaging/biopsy records (current and previous including abstractions from other facilities); face-to-face time discussing  her lab results and symptoms, medications doses, her options of short and long  term treatment based on the latest standards of care / guidelines;   and documenting the encounter.  Alejandra Sanders  participated in the discussions, expressed understanding, and voiced agreement with the above plans.  All questions were answered to her satisfaction. she is encouraged to contact clinic should she have any questions or concerns prior to her return visit.    Follow up plan: Return in about 1 year (around 01/25/2024) for Thyroid follow up, Previsit labs.   Thank you for involving me in the care of this pleasant patient, and I will continue to update you with her progress.  Ronny Bacon, [facility]  [facility] [address] [place], [place] [address] Phone: [phone] Fax: [phone]  01/25/2023, 9:50 AM

## 2023-01-27 ENCOUNTER — Ambulatory Visit: Payer: Medicaid Other | Admitting: Internal Medicine

## 2023-01-30 ENCOUNTER — Ambulatory Visit (INDEPENDENT_AMBULATORY_CARE_PROVIDER_SITE_OTHER): Payer: Medicaid Other | Admitting: Internal Medicine

## 2023-01-30 ENCOUNTER — Encounter: Payer: Self-pay | Admitting: Internal Medicine

## 2023-01-30 VITALS — BP 123/84 | HR 96 | Ht 65.0 in | Wt 204.4 lb

## 2023-01-30 DIAGNOSIS — E782 Mixed hyperlipidemia: Secondary | ICD-10-CM | POA: Diagnosis not present

## 2023-01-30 DIAGNOSIS — Z23 Encounter for immunization: Secondary | ICD-10-CM

## 2023-01-30 DIAGNOSIS — F5101 Primary insomnia: Secondary | ICD-10-CM | POA: Diagnosis not present

## 2023-01-30 DIAGNOSIS — M5136 Other intervertebral disc degeneration, lumbar region with discogenic back pain only: Secondary | ICD-10-CM | POA: Diagnosis not present

## 2023-01-30 DIAGNOSIS — E559 Vitamin D deficiency, unspecified: Secondary | ICD-10-CM | POA: Diagnosis not present

## 2023-01-30 DIAGNOSIS — Z0001 Encounter for general adult medical examination with abnormal findings: Secondary | ICD-10-CM | POA: Diagnosis not present

## 2023-01-30 DIAGNOSIS — M069 Rheumatoid arthritis, unspecified: Secondary | ICD-10-CM | POA: Diagnosis not present

## 2023-01-30 DIAGNOSIS — Z1211 Encounter for screening for malignant neoplasm of colon: Secondary | ICD-10-CM

## 2023-01-30 DIAGNOSIS — I10 Essential (primary) hypertension: Secondary | ICD-10-CM | POA: Diagnosis not present

## 2023-01-30 MED ORDER — AMITRIPTYLINE HCL 10 MG PO TABS: 10.0000 mg | ORAL_TABLET | Freq: Every day | ORAL | 3 refills | Status: DC

## 2023-01-30 MED ORDER — ROSUVASTATIN CALCIUM 5 MG PO TABS: 5.0000 mg | ORAL_TABLET | Freq: Every day | ORAL | 1 refills | Status: AC

## 2023-01-30 MED ORDER — VITAMIN D (ERGOCALCIFEROL) 1.25 MG (50000 UNIT) PO CAPS: 50000.0000 [IU] | ORAL_CAPSULE | ORAL | 1 refills | Status: AC

## 2023-01-30 NOTE — Assessment & Plan Note (Signed)
Last vitamin D Lab Results  Component Value Date   VD25OH 9.8 (L) 01/23/2023   Started vitamin D 50,000 IU QW

## 2023-01-30 NOTE — Assessment & Plan Note (Signed)
 Physical exam as documented. Immunization and cancer screening needs are specifically addressed at this visit.

## 2023-01-30 NOTE — Assessment & Plan Note (Addendum)
She has difficulty maintaining sleep, likely contributing to chronic fatigue Had added Elavil for insomnia -needs to start taking it Sleep hygiene discussed, material provided

## 2023-01-30 NOTE — Assessment & Plan Note (Signed)
BP Readings from Last 1 Encounters:  01/30/23 123/84   Usually well-controlled with Amlodipine, HCTZ and Labetalol Counseled for compliance with the medications Advised DASH diet and moderate exercise/walking, at least 150 mins/week

## 2023-01-30 NOTE — Progress Notes (Signed)
Established Patient Office Visit  Subjective:  Patient ID: Alejandra Sanders, female    DOB: 01-Oct-1960  Age: 62 y.o. MRN: [id number]  CC:  Chief Complaint  Patient presents with   Annual Exam    HPI Alejandra Sanders is a 62 y.o. female with past medical history of HTN, RA, toxic thyroid nodule and tobacco abuse who presents for annual physical.  HTN: BP is well-controlled. Takes medications regularly. Patient denies headache, dizziness, chest pain, dyspnea or palpitations.   She has a history of RA, for which she takes Plaquenil and Orencia. She follows up with rheumatologist in [place]. Denies any vision problem currently.  She complains of bilateral knee pain, which is worse with walking and prolonged standing.   She has had RAI ablation of toxic thyroid nodule.  Her TSH and free T4 have been WNL without needing thyroid supplement.   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  She wakes up due to the movement of her parents walking in her home at nighttime.  She feels tired throughout the day.  She was given Elavil in the last visit for insomnia and chronic fatigue, but has not tried yet. She denies any recent change in her appetite or weight, fever, chills, chronic cough, hemoptysis, night sweats or LAD.    Past Medical History:  Diagnosis Date   Arthritis    Calculus of gallbladder with chronic cholecystitis without obstruction    Chronic cholecystitis 06/11/2018   Hypertension    Seizure (HCC)    had seizurea as child; unknow etiology; was on phenobarbital for a few years and then was taken off meds. No seizure in 40 years.   Thyroid disease    Trichimoniasis 02/11/2021   Treated with flagyl 02/11/21    Past Surgical History:  Procedure Laterality Date   CHOLECYSTECTOMY N/A 06/11/2018   Procedure: LAPAROSCOPIC CHOLECYSTECTOMY;  Surgeon: Lucretia Roers, MD;  Location: [facility];  Service: General;  Laterality: N/A;   GALLBLADDER SURGERY   06/11/2018   THYROID SURGERY     removal of thyroid    Family History  Problem Relation Age of Onset   Aneurysm Maternal Grandmother    Hypertension Father    Hypertension Mother    Diabetes Mother    Heart attack Brother    Hypertension Sister    Kidney failure Sister    Hypertension Sister    Hypertension Sister    Hypertension Son     Social History   Socioeconomic History   Marital status: Single    Spouse name: Not on file   Number of children: Not on file   Years of education: Not on file   Highest education level: Not on file  Occupational History   Not on file  Tobacco Use   Smoking status: Every Day    Current packs/day: 0.25    Average packs/day: 0.3 packs/day for 35.0 years (8.8 ttl pk-yrs)    Types: Cigarettes   Smokeless tobacco: Never   Tobacco comments:    states smokes 3 cig daily only  Substance and Sexual Activity   Alcohol use: No   Drug use: No   Sexual activity: Not Currently    Birth control/protection: Post-menopausal  Other Topics Concern   Not on file  Social History Narrative   Not on file   Social Determinants of Health   Financial Resource Strain: Low Risk  (02/08/2021)   Overall Financial Resource Strain (CARDIA)    Difficulty  of Paying Living Expenses: Not hard at all  Food Insecurity: No Food Insecurity (02/08/2021)   Hunger Vital Sign    Worried About Running Out of Food in the Last Year: Never true    Ran Out of Food in the Last Year: Never true  Transportation Needs: No Transportation Needs (02/08/2021)   PRAPARE - Administrator, Civil Service (Medical): No    Lack of Transportation (Non-Medical): No  Physical Activity: Insufficiently Active (02/08/2021)   Exercise Vital Sign    Days of Exercise per Week: 2 days    Minutes of Exercise per Session: 20 min  Stress: No Stress Concern Present (02/08/2021)   [employer] of Occupational Health - Occupational Stress Questionnaire    Feeling of Stress : Only a  little  Social Connections: Moderately Integrated (02/08/2021)   Social Connection and Isolation Panel [NHANES]    Frequency of Communication with Friends and Family: More than three times a week    Frequency of Social Gatherings with Friends and Family: More than three times a week    Attends Religious Services: More than 4 times per year    Active Member of [employer] or Organizations: Yes    Attends Banker Meetings: More than 4 times per year    Marital Status: Never married  Intimate Partner Violence: Not At Risk (02/08/2021)   Humiliation, Afraid, Rape, and Kick questionnaire    Fear of Current or Ex-Partner: No    Emotionally Abused: No    Physically Abused: No    Sexually Abused: No    Outpatient Medications Prior to Visit  Medication Sig Dispense Refill   amLODipine (NORVASC) 10 MG tablet Take 1 tablet (10 mg total) by mouth daily. 90 tablet 1   diclofenac Sodium (VOLTAREN) 1 % GEL See admin instructions.     hydrochlorothiazide (MICROZIDE) 12.5 MG capsule Take 1 capsule (12.5 mg total) by mouth daily. 90 capsule 1   hydroxychloroquine (PLAQUENIL) 200 MG tablet Take 2 tablets by mouth daily.     labetalol (NORMODYNE) 200 MG tablet Take 1 tablet (200 mg total) by mouth 2 (two) times daily. 180 tablet 1   methocarbamol (ROBAXIN) 500 MG tablet Take 1 tablet (500 mg total) by mouth every 6 (six) hours as needed for muscle spasms. 20 tablet 0   mometasone (NASONEX) 50 MCG/ACT nasal spray Place 1 spray into the nose at bedtime. 1 each 0   naproxen (NAPROSYN) 500 MG tablet Take 1 tablet (500 mg total) by mouth 2 (two) times daily. 20 tablet 0   ORENCIA CLICKJECT 125 MG/ML SOAJ Inject 1 pen into the skin once a week. Takes on Tuesdays     amitriptyline (ELAVIL) 10 MG tablet Take 1 tablet (10 mg total) by mouth at bedtime. 30 tablet 3   predniSONE (STERAPRED UNI-PAK 21 TAB) 10 MG (21) TBPK tablet Take by mouth daily. Take as directed. 21 tablet 0   No facility-administered  medications prior to visit.    Allergies  Allergen Reactions   Lisinopril Swelling    ROS Review of Systems  Constitutional:  Positive for fatigue. Negative for chills and fever.  HENT:  Negative for congestion, postnasal drip, sinus pressure and sinus pain.   Eyes:  Negative for pain and discharge.  Respiratory:  Negative for cough and shortness of breath.   Cardiovascular:  Negative for chest pain and palpitations.  Gastrointestinal:  Negative for abdominal pain, diarrhea, nausea and vomiting.  Endocrine: Negative for polydipsia and polyuria.  Genitourinary:  Negative for dysuria and hematuria.  Musculoskeletal:  Positive for arthralgias and back pain. Negative for neck pain and neck stiffness.  Skin:  Negative for rash.  Neurological:  Negative for dizziness and weakness.  Psychiatric/Behavioral:  Positive for sleep disturbance. Negative for agitation and behavioral problems.       Objective:    Physical Exam Vitals reviewed.  Constitutional:      General: She is not in acute distress.    Appearance: She is not diaphoretic.  HENT:     Head: Normocephalic and atraumatic.     Nose: No congestion.     Mouth/Throat:     Mouth: Mucous membranes are moist.     Pharynx: No posterior oropharyngeal erythema.  Eyes:     General: No scleral icterus.    Extraocular Movements: Extraocular movements intact.  Cardiovascular:     Rate and Rhythm: Normal rate and regular rhythm.     Pulses: Normal pulses.     Heart sounds: Normal heart sounds. No murmur heard. Pulmonary:     Breath sounds: Normal breath sounds. No wheezing or rales.  Abdominal:     Palpations: Abdomen is soft.     Tenderness: There is no abdominal tenderness.  Musculoskeletal:        General: Tenderness (Lumbar spine area and right paraspinal area) present.     Cervical back: Neck supple. No tenderness.     Right lower leg: No edema.     Left lower leg: No edema.  Skin:    General: Skin is warm.      Findings: No rash.  Neurological:     General: No focal deficit present.     Mental Status: She is alert and oriented to person, place, and time.     Sensory: No sensory deficit.     Motor: No weakness.  Psychiatric:        Mood and Affect: Mood normal.        Behavior: Behavior normal.     BP 123/84 (BP Location: Right Arm, Patient Position: Sitting, Cuff Size: Normal)   Pulse 96   Ht 5\' 5"  (1.651 m)   Wt 204 lb 6.4 oz (92.7 kg)   SpO2 97%   BMI 34.01 kg/m  Wt Readings from Last 3 Encounters:  01/30/23 204 lb 6.4 oz (92.7 kg)  01/25/23 203 lb 8 oz (92.3 kg)  01/22/23 202 lb (91.6 kg)    Lab Results  Component Value Date   TSH 1.070 01/23/2023   Lab Results  Component Value Date   WBC 5.9 01/23/2023   HGB 13.5 01/23/2023   HCT 41.2 01/23/2023   MCV 81 01/23/2023   PLT 295 01/23/2023   Lab Results  Component Value Date   NA 142 01/23/2023   K 4.3 01/23/2023   CO2 24 01/23/2023   GLUCOSE 91 01/23/2023   BUN 12 01/23/2023   CREATININE 0.58 01/23/2023   BILITOT 0.4 01/23/2023   ALKPHOS 134 (H) 01/23/2023   AST 17 01/23/2023   ALT 18 01/23/2023   PROT 6.9 01/23/2023   ALBUMIN 4.1 01/23/2023   CALCIUM 9.2 01/23/2023   ANIONGAP 8 06/13/2022   EGFR 103 01/23/2023   Lab Results  Component Value Date   CHOL 189 01/23/2023   Lab Results  Component Value Date   HDL 37 (L) 01/23/2023   Lab Results  Component Value Date   LDLCALC 132 (H) 01/23/2023   Lab Results  Component Value Date   TRIG 112 01/23/2023  Lab Results  Component Value Date   CHOLHDL 5.1 (H) 01/23/2023   Lab Results  Component Value Date   HGBA1C 6.0 (H) 01/23/2023      Assessment & Plan:   Problem List Items Addressed This Visit       Cardiovascular and Mediastinum   Essential hypertension (Chronic)    BP Readings from Last 1 Encounters:  01/30/23 123/84   Usually well-controlled with Amlodipine, HCTZ and Labetalol Counseled for compliance with the medications Advised  DASH diet and moderate exercise/walking, at least 150 mins/week      Relevant Medications   rosuvastatin (CRESTOR) 5 MG tablet     Musculoskeletal and Integument   Rheumatoid arthritis (HCC)    Followed by Rheumatology On Plaquenil and Orencia Needs to follow up with Ophthalmology      DDD (degenerative disc disease), lumbar    Lumbar spine x-ray from ER reviewed Also has history of RA Takes Tylenol or naproxen as needed Robaxin as needed for muscle spasms Advised to perform simple back exercises         Other   Encounter for general adult medical examination with abnormal findings - Primary    Physical exam as documented. Immunization and cancer screening needs are specifically addressed at this visit.      Primary insomnia    She has difficulty maintaining sleep, likely contributing to chronic fatigue Had added Elavil for insomnia -needs to start taking it Sleep hygiene discussed, material provided      Relevant Medications   amitriptyline (ELAVIL) 10 MG tablet   Mixed hyperlipidemia    Checked lipid profile Considering elevated ASCVD risk, started Crestor Advised to follow DASH diet      Relevant Medications   rosuvastatin (CRESTOR) 5 MG tablet   Vitamin D deficiency    Last vitamin D Lab Results  Component Value Date   VD25OH 9.8 (L) 01/23/2023   Started vitamin D 50,000 IU QW      Relevant Medications   Vitamin D, Ergocalciferol, (DRISDOL) 1.25 MG (50000 UNIT) CAPS capsule   Other Visit Diagnoses     Screen for colon cancer       Relevant Orders   Ambulatory referral to Gastroenterology   Encounter for immunization       Relevant Orders   Flu vaccine trivalent PF, 6mos and older(Flulaval,Afluria,Fluarix,Fluzone) (Completed)   Varicella-zoster vaccine IM (Completed)       Meds ordered this encounter  Medications   rosuvastatin (CRESTOR) 5 MG tablet    Sig: Take 1 tablet (5 mg total) by mouth daily.    Dispense:  90 tablet    Refill:  1    Vitamin D, Ergocalciferol, (DRISDOL) 1.25 MG (50000 UNIT) CAPS capsule    Sig: Take 1 capsule (50,000 Units total) by mouth every 7 (seven) days.    Dispense:  12 capsule    Refill:  1   amitriptyline (ELAVIL) 10 MG tablet    Sig: Take 1 tablet (10 mg total) by mouth at bedtime.    Dispense:  30 tablet    Refill:  3    Follow-up: Return in about 4 months (around 06/02/2023) for HTN and HLD.    Anabel Halon, MD

## 2023-01-30 NOTE — Assessment & Plan Note (Addendum)
Checked lipid profile Considering elevated ASCVD risk, started Crestor Advised to follow DASH diet

## 2023-01-30 NOTE — Assessment & Plan Note (Signed)
Followed by Rheumatology On Plaquenil and Orencia Needs to follow up with Ophthalmology 

## 2023-01-30 NOTE — Patient Instructions (Signed)
Please start taking Rosuvastatin for cholesterol.  Start taking Vitamin D 50,000 IU once weekly.  Start taking Amitriptyline for insomnia.  Please continue to take other medications as prescribed.  Please continue to follow low carb diet and perform moderate exercise/walking at least 150 mins/week.

## 2023-01-30 NOTE — Assessment & Plan Note (Signed)
Lumbar spine x-ray from ER reviewed Also has history of RA Takes Tylenol or naproxen as needed Robaxin as needed for muscle spasms Advised to perform simple back exercises

## 2023-01-31 ENCOUNTER — Encounter (INDEPENDENT_AMBULATORY_CARE_PROVIDER_SITE_OTHER): Payer: Self-pay | Admitting: *Deleted

## 2023-05-03 ENCOUNTER — Encounter: Payer: Self-pay | Admitting: Internal Medicine

## 2023-05-03 ENCOUNTER — Ambulatory Visit: Payer: Medicaid Other | Attending: Internal Medicine | Admitting: Internal Medicine

## 2023-05-03 VITALS — BP 130/78 | HR 84 | Ht 65.0 in | Wt 203.0 lb

## 2023-05-03 DIAGNOSIS — I2510 Atherosclerotic heart disease of native coronary artery without angina pectoris: Secondary | ICD-10-CM

## 2023-05-03 DIAGNOSIS — R0609 Other forms of dyspnea: Secondary | ICD-10-CM | POA: Insufficient documentation

## 2023-05-03 NOTE — Patient Instructions (Signed)
Medication Instructions:  Your physician recommends that you continue on your current medications as directed. Please refer to the Current Medication list given to you today.   HOLD Labetalol the night before and the morning of test  Labwork: None today  Testing/Procedures: Your physician has requested that you have a lexiscan myoview  (EXERCISE MYOVIEW) . For further information please visit [email or web address] Please follow instruction sheet, as given.   Follow-Up: Pending myoview results  Any Other Special Instructions Will Be Listed Below (If Applicable).  If you need a refill on your cardiac medications before your next appointment, please call your pharmacy.

## 2023-05-03 NOTE — Progress Notes (Signed)
Cardiology Office Note  Date: 05/03/2023   ID: Alejandra Sanders, DOB [birth year], MRN [id number]  PCP:  Anabel Halon, MD  Cardiologist:  Marjo Bicker, MD Electrophysiologist:  None    History of Present Illness: Alejandra Sanders is a 63 y.o. female known to have HTN, RA is here for follow-up visit.  Echocardiogram in May 2024 showed normal LVEF and no valvular heart disease.  She continues to have SOB, thought to be caregiver fatigue.  No orthopnea, PND, leg swelling, angina, dizziness, palpitations, syncope.  Past Medical History:  Diagnosis Date   Arthritis    Calculus of gallbladder with chronic cholecystitis without obstruction    Chronic cholecystitis 06/11/2018   Hypertension    Seizure (HCC)    had seizurea as child; unknow etiology; was on phenobarbital for a few years and then was taken off meds. No seizure in 40 years.   Thyroid disease    Trichimoniasis 02/11/2021   Treated with flagyl 02/11/21    Past Surgical History:  Procedure Laterality Date   CHOLECYSTECTOMY N/A 06/11/2018   Procedure: LAPAROSCOPIC CHOLECYSTECTOMY;  Surgeon: Lucretia Roers, MD;  Location: [facility];  Service: General;  Laterality: N/A;   GALLBLADDER SURGERY  06/11/2018   THYROID SURGERY     removal of thyroid    Current Outpatient Medications  Medication Sig Dispense Refill   amitriptyline (ELAVIL) 10 MG tablet Take 1 tablet (10 mg total) by mouth at bedtime. 30 tablet 3   amLODipine (NORVASC) 10 MG tablet Take 1 tablet (10 mg total) by mouth daily. 90 tablet 1   diclofenac Sodium (VOLTAREN) 1 % GEL See admin instructions.     hydrochlorothiazide (MICROZIDE) 12.5 MG capsule Take 1 capsule (12.5 mg total) by mouth daily. 90 capsule 1   hydroxychloroquine (PLAQUENIL) 200 MG tablet Take 2 tablets by mouth daily.     labetalol (NORMODYNE) 200 MG tablet Take 1 tablet (200 mg total) by mouth 2 (two) times daily. 180 tablet 1   methocarbamol (ROBAXIN) 500 MG tablet Take 1 tablet (500 mg  total) by mouth every 6 (six) hours as needed for muscle spasms. 20 tablet 0   mometasone (NASONEX) 50 MCG/ACT nasal spray Place 1 spray into the nose at bedtime. 1 each 0   naproxen (NAPROSYN) 500 MG tablet Take 1 tablet (500 mg total) by mouth 2 (two) times daily. 20 tablet 0   ORENCIA CLICKJECT 125 MG/ML SOAJ Inject 1 pen into the skin once a week. Takes on Tuesdays     rosuvastatin (CRESTOR) 5 MG tablet Take 1 tablet (5 mg total) by mouth daily. 90 tablet 1   Vitamin D, Ergocalciferol, (DRISDOL) 1.25 MG (50000 UNIT) CAPS capsule Take 1 capsule (50,000 Units total) by mouth every 7 (seven) days. 12 capsule 1   No current facility-administered medications for this visit.   Allergies:  Lisinopril   Social History: The patient  reports that she has been smoking cigarettes. She has a 8.8 pack-year smoking history. She has never used smokeless tobacco. She reports that she does not drink alcohol and does not use drugs.   Family History: The patient's family history includes Aneurysm in her maternal grandmother; Diabetes in her mother; Heart attack in her brother; Hypertension in her father, mother, sister, sister, sister, and son; Kidney failure in her sister.   ROS:  Please see the history of present illness. Otherwise, complete review of systems is positive for none.  All other systems are reviewed and negative.   Physical Exam:  VS:  BP 130/78   Pulse 84   Ht 5\' 5"  (1.651 m)   Wt 203 lb (92.1 kg)   SpO2 93%   BMI 33.78 kg/m , BMI Body mass index is 33.78 kg/m.  Wt Readings from Last 3 Encounters:  05/03/23 203 lb (92.1 kg)  01/30/23 204 lb 6.4 oz (92.7 kg)  01/25/23 203 lb 8 oz (92.3 kg)    General: Patient appears comfortable at rest. HEENT: Conjunctiva and lids normal, oropharynx clear with moist mucosa. Neck: Supple, no elevated JVP or carotid bruits, no thyromegaly. Lungs: Clear to auscultation, nonlabored breathing at rest. Cardiac: Regular rate and rhythm, no S3 or  significant systolic murmur, no pericardial rub. Abdomen: Soft, nontender, no hepatomegaly, bowel sounds present, no guarding or rebound. Extremities: No pitting edema, distal pulses 2+. Skin: Warm and dry. Musculoskeletal: No kyphosis. Neuropsychiatric: Alert and oriented x3, affect grossly appropriate.  ECG:  NSR  Recent Labwork: 06/13/2022: B Natriuretic Peptide 14.0 01/23/2023: ALT 18; AST 17; BUN 12; Creatinine, Ser 0.58; Hemoglobin 13.5; Platelets 295; Potassium 4.3; Sodium 142; TSH 1.070     Component Value Date/Time   CHOL 189 01/23/2023 1054   TRIG 112 01/23/2023 1054   HDL 37 (L) 01/23/2023 1054   CHOLHDL 5.1 (H) 01/23/2023 1054   LDLCALC 132 (H) 01/23/2023 1054   Assessment and Plan:  DOE: Initially thought to be caregiver fatigue.  Will obtain exercise Myoview due to cardiac risk factors, HTN and RA.  She does have calcification of the LAD artery, will rule out any LAD ischemia.  Borderline cardiomegaly on CT scan: Echocardiogram from May 2024 showed normal LVEF and no valvular heart disease.  No further workup at this time.  HTN, controlled: Continue amlodipine 10 mg once daily, HCTZ 12.5 mg once daily, labetalol 200 mg twice daily.  Follow with PCP.  HLD, not at goal: Continue rosuvastatin 5 mg nightly.  Goal LDL less than 100.  Nicotine abuse: Smoking cessation counseling.  Caregiver fatigue: Patient has chronic fatigue, takes care of her 13 year old father and 37 year old mother.    Medication Adjustments/Labs and Tests Ordered: Current medicines are reviewed at length with the patient today.  Concerns regarding medicines are outlined above.   Tests Ordered: Orders Placed This Encounter  Procedures   EKG 12-Lead     Medication Changes: No orders of the defined types were placed in this encounter.   Disposition:  Follow up pending results  Signed, Eddy Termine Verne Spurr, MD, 05/03/2023 8:52 AM    [facility] [address], [place], [place] [address]

## 2023-05-10 ENCOUNTER — Ambulatory Visit (HOSPITAL_COMMUNITY)
Admission: RE | Admit: 2023-05-10 | Discharge: 2023-05-10 | Disposition: A | Discharge status: Home / Self Care | Payer: Medicaid Other | Source: Ambulatory Visit | Attending: Internal Medicine | Admitting: Internal Medicine

## 2023-05-10 DIAGNOSIS — R0609 Other forms of dyspnea: Secondary | ICD-10-CM | POA: Diagnosis not present

## 2023-05-10 LAB — NM MYOCAR MULTI W/SPECT W/WALL MOTION / EF
Angina Index: 0
Duke Treadmill Score: 5
Estimated workload: 7
Exercise duration (min): 5 min
Exercise duration (sec): 3 s
LV dias vol: 75 mL (ref 46–106)
LV sys vol: 24 mL
MPHR: 158 {beats}/min
Nuc Stress EF: 69 %
Peak HR: 134 {beats}/min
Percent HR: 84 %
RATE: 0.3
RPE: 13
Rest HR: 78 {beats}/min
Rest Nuclear Isotope Dose: 9.9 mCi
SDS: 6
SRS: 4
SSS: 10
ST Depression (mm): 0 mm
Stress Nuclear Isotope Dose: 30 mCi
TID: 1.13

## 2023-05-10 MED ORDER — TECHNETIUM TC 99M TETROFOSMIN IV KIT
30.0000 | PACK | Freq: Once | INTRAVENOUS | Status: AC | PRN
  Administered 2023-05-10: 30 via INTRAVENOUS

## 2023-05-10 MED ORDER — REGADENOSON 0.4 MG/5ML IV SOLN
INTRAVENOUS | Status: AC
  Filled 2023-05-10: qty 5

## 2023-05-10 MED ORDER — TECHNETIUM TC 99M TETROFOSMIN IV KIT
10.0000 | PACK | Freq: Once | INTRAVENOUS | Status: AC | PRN
  Administered 2023-05-10: 9.9 via INTRAVENOUS

## 2023-05-10 MED ORDER — SODIUM CHLORIDE FLUSH 0.9 % IV SOLN
INTRAVENOUS | Status: AC
  Filled 2023-05-10: qty 10

## 2023-05-17 ENCOUNTER — Other Ambulatory Visit: Payer: Self-pay

## 2023-05-17 ENCOUNTER — Telehealth: Payer: Self-pay

## 2023-05-17 DIAGNOSIS — Z136 Encounter for screening for cardiovascular disorders: Secondary | ICD-10-CM

## 2023-05-17 MED ORDER — METOPROLOL TARTRATE 100 MG PO TABS: ORAL_TABLET | ORAL | 0 refills | Status: DC

## 2023-05-17 NOTE — Telephone Encounter (Signed)
-----   Message from Vishnu P Mallipeddi sent at 05/11/2023  9:15 AM EST ----- Stress test is abnormal. Could be false positive test due to artifact. Obtain CTA cardiac for CAD eval.

## 2023-05-17 NOTE — Telephone Encounter (Signed)
The patient has been notified of the result and verbalized understanding.  All questions (if any) were answered. Alejandra Sanders, [facility] 05/17/2023 2:47 PM

## 2023-05-30 ENCOUNTER — Ambulatory Visit: Payer: Medicaid Other | Admitting: Internal Medicine

## 2023-05-30 ENCOUNTER — Encounter: Payer: Self-pay | Admitting: Internal Medicine

## 2023-05-30 VITALS — BP 124/79 | HR 75 | Ht 65.0 in | Wt 201.0 lb

## 2023-05-30 DIAGNOSIS — I10 Essential (primary) hypertension: Secondary | ICD-10-CM

## 2023-05-30 DIAGNOSIS — E782 Mixed hyperlipidemia: Secondary | ICD-10-CM | POA: Diagnosis not present

## 2023-05-30 DIAGNOSIS — Z23 Encounter for immunization: Secondary | ICD-10-CM

## 2023-05-30 DIAGNOSIS — R062 Wheezing: Secondary | ICD-10-CM | POA: Diagnosis not present

## 2023-05-30 DIAGNOSIS — R0609 Other forms of dyspnea: Secondary | ICD-10-CM | POA: Diagnosis not present

## 2023-05-30 DIAGNOSIS — Z1231 Encounter for screening mammogram for malignant neoplasm of breast: Secondary | ICD-10-CM

## 2023-05-30 DIAGNOSIS — M069 Rheumatoid arthritis, unspecified: Secondary | ICD-10-CM

## 2023-05-30 DIAGNOSIS — M1991 Primary osteoarthritis, unspecified site: Secondary | ICD-10-CM

## 2023-05-30 DIAGNOSIS — E0510 Thyrotoxicosis with toxic single thyroid nodule without thyrotoxic crisis or storm: Secondary | ICD-10-CM

## 2023-05-30 DIAGNOSIS — Z720 Tobacco use: Secondary | ICD-10-CM | POA: Diagnosis not present

## 2023-05-30 DIAGNOSIS — R7303 Prediabetes: Secondary | ICD-10-CM | POA: Diagnosis not present

## 2023-05-30 MED ORDER — VENTOLIN HFA 108 (90 BASE) MCG/ACT IN AERS: 1.0000 | INHALATION_SPRAY | Freq: Four times a day (QID) | RESPIRATORY_TRACT | 1 refills | Status: AC | PRN

## 2023-05-30 MED ORDER — AMLODIPINE BESYLATE 10 MG PO TABS: 10.0000 mg | ORAL_TABLET | Freq: Every day | ORAL | 1 refills | Status: DC

## 2023-05-30 MED ORDER — LABETALOL HCL 200 MG PO TABS: 200.0000 mg | ORAL_TABLET | Freq: Two times a day (BID) | ORAL | 1 refills | Status: DC

## 2023-05-30 MED ORDER — HYDROCHLOROTHIAZIDE 12.5 MG PO CAPS: 12.5000 mg | ORAL_CAPSULE | Freq: Every day | ORAL | 1 refills | Status: DC

## 2023-05-30 MED ORDER — NAPROXEN 500 MG PO TABS: 500.0000 mg | ORAL_TABLET | Freq: Two times a day (BID) | ORAL | 1 refills | Status: DC

## 2023-05-30 NOTE — Assessment & Plan Note (Signed)
Followed by Rheumatology On Plaquenil and Orencia Needs to follow up with Ophthalmology 

## 2023-05-30 NOTE — Assessment & Plan Note (Signed)
 B/l knee pain likely due to OA Naproxen as needed for pain Followed by rheumatology

## 2023-05-30 NOTE — Assessment & Plan Note (Signed)
S/p RAI ablation ?Followed by endocrinology ?

## 2023-05-30 NOTE — Assessment & Plan Note (Signed)
 BP Readings from Last 1 Encounters:  05/03/23 130/78   Usually well-controlled with Amlodipine, HCTZ and Labetalol Counseled for compliance with the medications Advised DASH diet and moderate exercise/walking, at least 150 mins/week

## 2023-05-30 NOTE — Patient Instructions (Addendum)
 Please schedule Mammogram.  Please take Naproxen as needed for knee pain.  Please continue to take medications as prescribed.  Please continue to follow low carb diet and perform moderate exercise/walking at least 150 mins/week.

## 2023-05-30 NOTE — Assessment & Plan Note (Signed)
 Undergoing cardiac evaluation for CAD Nuclear stress was intermediate, planned to get CT coronary

## 2023-05-30 NOTE — Assessment & Plan Note (Addendum)
 Recheck lipid profile Considering elevated ASCVD risk, on Crestor now Advised to follow DASH diet

## 2023-05-30 NOTE — Assessment & Plan Note (Signed)
 Has mild wheezing, has history of allergic sinusitis Wheezing could be due to reactive airway disease Albuterol inhaler as needed for dyspnea or wheezing

## 2023-05-30 NOTE — Assessment & Plan Note (Signed)
Smokes about 5 cigarettes/day  Asked about quitting: confirms that she currently smokes cigarettes Advise to quit smoking: Educated about QUITTING to reduce the risk of cancer, cardio and cerebrovascular disease. Assess willingness: Unwilling to quit at this time, but is working on cutting back. Assist with counseling and pharmacotherapy: Counseled for 5 minutes and literature provided. Arrange for follow up: follow up in 3 months and continue to offer help. 

## 2023-05-30 NOTE — Progress Notes (Signed)
 Established Patient Office Visit  Subjective:  Patient ID: Alejandra Sanders, female    DOB: 14-Jul-1960  Age: 63 y.o. MRN: [id number]  CC:  Chief Complaint  Patient presents with   Care Management    4 month f/u, pt reports right knee pain, ongoing for 2-3 weeks. Would like a refill on naproxen    HPI Alejandra Sanders is a 63 y.o. female with past medical history of HTN, RA, toxic thyroid nodule and tobacco abuse who presents for f/u of her chronic medical conditions.  HTN: BP is well-controlled. Takes medications regularly. Patient denies headache, dizziness, chest pain, or palpitations.  She is undergoing evaluation of CAD due to exertional dyspnea.  She had been nuclear stress test, which was intermediate. She is planned to get CT coronary.  She has a history of RA, for which she takes Plaquenil and Orencia. She follows up with rheumatologist in [place]. Denies any vision problem currently.  She complains of bilateral knee pain, which is worse with walking and prolonged standing.   She has had RAI ablation of toxic thyroid nodule.  Her TSH and free T4 have been WNL without needing thyroid supplement.   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  She wakes up due to the movement of her parents walking in her home at nighttime.  She feels tired throughout the day.  She was given Elavil in the last visit for insomnia and chronic fatigue, but had drowsiness with it. She denies any recent change in her appetite or weight, fever, chills, chronic cough, hemoptysis, night sweats or LAD.    Past Medical History:  Diagnosis Date   Arthritis    Calculus of gallbladder with chronic cholecystitis without obstruction    Chronic cholecystitis 06/11/2018   Hypertension    Seizure (HCC)    had seizurea as child; unknow etiology; was on phenobarbital for a few years and then was taken off meds. No seizure in 40 years.   Thyroid disease    Trichimoniasis 02/11/2021    Treated with flagyl 02/11/21    Past Surgical History:  Procedure Laterality Date   CHOLECYSTECTOMY N/A 06/11/2018   Procedure: LAPAROSCOPIC CHOLECYSTECTOMY;  Surgeon: Lucretia Roers, MD;  Location: [facility];  Service: General;  Laterality: N/A;   GALLBLADDER SURGERY  06/11/2018   THYROID SURGERY     removal of thyroid    Family History  Problem Relation Age of Onset   Aneurysm Maternal Grandmother    Hypertension Father    Hypertension Mother    Diabetes Mother    Heart attack Brother    Hypertension Sister    Kidney failure Sister    Hypertension Sister    Hypertension Sister    Hypertension Son     Social History   Socioeconomic History   Marital status: Single    Spouse name: Not on file   Number of children: Not on file   Years of education: Not on file   Highest education level: Not on file  Occupational History   Not on file  Tobacco Use   Smoking status: Every Day    Current packs/day: 0.25    Average packs/day: 0.3 packs/day for 35.0 years (8.8 ttl pk-yrs)    Types: Cigarettes   Smokeless tobacco: Never   Tobacco comments:    states smokes 3 cig daily only  Substance and Sexual Activity   Alcohol use: No   Drug use: No   Sexual activity: Not  Currently    Birth control/protection: Post-menopausal  Other Topics Concern   Not on file  Social History Narrative   Not on file   Social Drivers of Health   Financial Resource Strain: Low Risk  (02/08/2021)   Overall Financial Resource Strain (CARDIA)    Difficulty of Paying Living Expenses: Not hard at all  Food Insecurity: No Food Insecurity (02/08/2021)   Hunger Vital Sign    Worried About Running Out of Food in the Last Year: Never true    Ran Out of Food in the Last Year: Never true  Transportation Needs: No Transportation Needs (02/08/2021)   PRAPARE - Administrator, Civil Service (Medical): No    Lack of Transportation (Non-Medical): No  Physical Activity: Insufficiently Active  (02/08/2021)   Exercise Vital Sign    Days of Exercise per Week: 2 days    Minutes of Exercise per Session: 20 min  Stress: No Stress Concern Present (02/08/2021)   [employer] of Occupational Health - Occupational Stress Questionnaire    Feeling of Stress : Only a little  Social Connections: Moderately Integrated (02/08/2021)   Social Connection and Isolation Panel [NHANES]    Frequency of Communication with Friends and Family: More than three times a week    Frequency of Social Gatherings with Friends and Family: More than three times a week    Attends Religious Services: More than 4 times per year    Active Member of [employer] or Organizations: Yes    Attends Banker Meetings: More than 4 times per year    Marital Status: Never married  Intimate Partner Violence: Not At Risk (02/08/2021)   Humiliation, Afraid, Rape, and Kick questionnaire    Fear of Current or Ex-Partner: No    Emotionally Abused: No    Physically Abused: No    Sexually Abused: No    Outpatient Medications Prior to Visit  Medication Sig Dispense Refill   diclofenac Sodium (VOLTAREN) 1 % GEL See admin instructions.     hydroxychloroquine (PLAQUENIL) 200 MG tablet Take 2 tablets by mouth daily.     methocarbamol (ROBAXIN) 500 MG tablet Take 1 tablet (500 mg total) by mouth every 6 (six) hours as needed for muscle spasms. 20 tablet 0   metoprolol tartrate (LOPRESSOR) 100 MG tablet Take 1 tablet by mouth two hours prior to CT scan. 1 tablet 0   mometasone (NASONEX) 50 MCG/ACT nasal spray Place 1 spray into the nose at bedtime. 1 each 0   ORENCIA CLICKJECT 125 MG/ML SOAJ Inject 1 pen into the skin once a week. Takes on Tuesdays     rosuvastatin (CRESTOR) 5 MG tablet Take 1 tablet (5 mg total) by mouth daily. 90 tablet 1   Vitamin D, Ergocalciferol, (DRISDOL) 1.25 MG (50000 UNIT) CAPS capsule Take 1 capsule (50,000 Units total) by mouth every 7 (seven) days. 12 capsule 1   amitriptyline (ELAVIL) 10 MG tablet  Take 1 tablet (10 mg total) by mouth at bedtime. 30 tablet 3   amLODipine (NORVASC) 10 MG tablet Take 1 tablet (10 mg total) by mouth daily. 90 tablet 1   hydrochlorothiazide (MICROZIDE) 12.5 MG capsule Take 1 capsule (12.5 mg total) by mouth daily. 90 capsule 1   labetalol (NORMODYNE) 200 MG tablet Take 1 tablet (200 mg total) by mouth 2 (two) times daily. 180 tablet 1   naproxen (NAPROSYN) 500 MG tablet Take 1 tablet (500 mg total) by mouth 2 (two) times daily. 20 tablet 0  No facility-administered medications prior to visit.    Allergies  Allergen Reactions   Lisinopril Swelling    ROS Review of Systems  Constitutional:  Positive for fatigue. Negative for chills and fever.  HENT:  Negative for congestion, postnasal drip, sinus pressure and sinus pain.   Eyes:  Negative for pain and discharge.  Respiratory:  Positive for shortness of breath (Exertional). Negative for cough.   Cardiovascular:  Negative for chest pain and palpitations.  Gastrointestinal:  Negative for abdominal pain, diarrhea, nausea and vomiting.  Endocrine: Negative for polydipsia and polyuria.  Genitourinary:  Negative for dysuria and hematuria.  Musculoskeletal:  Positive for arthralgias and back pain. Negative for neck pain and neck stiffness.  Skin:  Negative for rash.  Neurological:  Negative for dizziness and weakness.  Psychiatric/Behavioral:  Positive for sleep disturbance. Negative for agitation and behavioral problems.       Objective:    Physical Exam Vitals reviewed.  Constitutional:      General: She is not in acute distress.    Appearance: She is not diaphoretic.  HENT:     Head: Normocephalic and atraumatic.     Nose: No congestion.     Mouth/Throat:     Mouth: Mucous membranes are moist.     Pharynx: No posterior oropharyngeal erythema.  Eyes:     General: No scleral icterus.    Extraocular Movements: Extraocular movements intact.  Cardiovascular:     Rate and Rhythm: Normal rate and  regular rhythm.     Pulses: Normal pulses.     Heart sounds: Normal heart sounds. No murmur heard. Pulmonary:     Breath sounds: Wheezing (Mild, upper lung fields) present. No rales.  Abdominal:     Palpations: Abdomen is soft.     Tenderness: There is no abdominal tenderness.  Musculoskeletal:        General: Tenderness (Lumbar spine area and right paraspinal area) present.     Cervical back: Neck supple. No tenderness.     Right lower leg: No edema.     Left lower leg: No edema.  Skin:    General: Skin is warm.     Findings: No rash.  Neurological:     General: No focal deficit present.     Mental Status: She is alert and oriented to person, place, and time.     Sensory: No sensory deficit.     Motor: No weakness.  Psychiatric:        Mood and Affect: Mood normal.        Behavior: Behavior normal.     BP 124/79   Pulse 75   Ht 5\' 5"  (1.651 m)   Wt 201 lb (91.2 kg)   SpO2 97%   BMI 33.45 kg/m  Wt Readings from Last 3 Encounters:  05/30/23 201 lb (91.2 kg)  05/03/23 203 lb (92.1 kg)  01/30/23 204 lb 6.4 oz (92.7 kg)    Lab Results  Component Value Date   TSH 1.070 01/23/2023   Lab Results  Component Value Date   WBC 5.9 01/23/2023   HGB 13.5 01/23/2023   HCT 41.2 01/23/2023   MCV 81 01/23/2023   PLT 295 01/23/2023   Lab Results  Component Value Date   NA 142 01/23/2023   K 4.3 01/23/2023   CO2 24 01/23/2023   GLUCOSE 91 01/23/2023   BUN 12 01/23/2023   CREATININE 0.58 01/23/2023   BILITOT 0.4 01/23/2023   ALKPHOS 134 (H) 01/23/2023   AST 17  01/23/2023   ALT 18 01/23/2023   PROT 6.9 01/23/2023   ALBUMIN 4.1 01/23/2023   CALCIUM 9.2 01/23/2023   ANIONGAP 8 06/13/2022   EGFR 103 01/23/2023   Lab Results  Component Value Date   CHOL 189 01/23/2023   Lab Results  Component Value Date   HDL 37 (L) 01/23/2023   Lab Results  Component Value Date   LDLCALC 132 (H) 01/23/2023   Lab Results  Component Value Date   TRIG 112 01/23/2023   Lab  Results  Component Value Date   CHOLHDL 5.1 (H) 01/23/2023   Lab Results  Component Value Date   HGBA1C 6.0 (H) 01/23/2023      Assessment & Plan:   Problem List Items Addressed This Visit       Cardiovascular and Mediastinum   Essential hypertension - Primary (Chronic)   BP Readings from Last 1 Encounters:  05/03/23 130/78   Usually well-controlled with Amlodipine, HCTZ and Labetalol Counseled for compliance with the medications Advised DASH diet and moderate exercise/walking, at least 150 mins/week      Relevant Medications   amLODipine (NORVASC) 10 MG tablet   hydrochlorothiazide (MICROZIDE) 12.5 MG capsule   labetalol (NORMODYNE) 200 MG tablet     Endocrine   Toxic thyroid nodule   S/p RAI ablation Followed by endocrinology      Relevant Medications   labetalol (NORMODYNE) 200 MG tablet   Other Relevant Orders   TSH + free T4     Musculoskeletal and Integument   Rheumatoid arthritis (HCC)   Followed by Rheumatology On Plaquenil and Orencia Needs to follow up with Ophthalmology      Relevant Medications   naproxen (NAPROSYN) 500 MG tablet   Primary localized osteoarthrosis of multiple sites   B/l knee pain likely due to OA Naproxen as needed for pain Followed by rheumatology      Relevant Medications   naproxen (NAPROSYN) 500 MG tablet     Other   Tobacco abuse (Chronic)   Smokes about 5 cigarettes/day  Asked about quitting: confirms that she currently smokes cigarettes Advise to quit smoking: Educated about QUITTING to reduce the risk of cancer, cardio and cerebrovascular disease. Assess willingness: Unwilling to quit at this time, but is working on cutting back. Assist with counseling and pharmacotherapy: Counseled for 5 minutes and literature provided. Arrange for follow up: follow up in 3 months and continue to offer help.      Mixed hyperlipidemia (Chronic)   Recheck lipid profile Considering elevated ASCVD risk, on Crestor now Advised  to follow DASH diet      Relevant Medications   amLODipine (NORVASC) 10 MG tablet   hydrochlorothiazide (MICROZIDE) 12.5 MG capsule   labetalol (NORMODYNE) 200 MG tablet   Other Relevant Orders   CMP14+EGFR   Lipid Profile   DOE (dyspnea on exertion) (Chronic)   Undergoing cardiac evaluation for CAD Nuclear stress was intermediate, planned to get CT coronary      Wheezing   Has mild wheezing, has history of allergic sinusitis Wheezing could be due to reactive airway disease Albuterol inhaler as needed for dyspnea or wheezing       Relevant Medications   albuterol (VENTOLIN HFA) 108 (90 Base) MCG/ACT inhaler   Prediabetes   Relevant Orders   CMP14+EGFR   Hemoglobin A1c   Other Visit Diagnoses       Breast cancer screening by mammogram       Relevant Orders   MM 3D SCREENING MAMMOGRAM BILATERAL  BREAST     Encounter for immunization       Relevant Orders   Pneumococcal conjugate vaccine 20-valent (Completed)        Meds ordered this encounter  Medications   naproxen (NAPROSYN) 500 MG tablet    Sig: Take 1 tablet (500 mg total) by mouth 2 (two) times daily.    Dispense:  30 tablet    Refill:  1   albuterol (VENTOLIN HFA) 108 (90 Base) MCG/ACT inhaler    Sig: Inhale 1-2 puffs into the lungs every 6 (six) hours as needed for wheezing or shortness of breath.    Dispense:  18 g    Refill:  1   amLODipine (NORVASC) 10 MG tablet    Sig: Take 1 tablet (10 mg total) by mouth daily.    Dispense:  90 tablet    Refill:  1   hydrochlorothiazide (MICROZIDE) 12.5 MG capsule    Sig: Take 1 capsule (12.5 mg total) by mouth daily.    Dispense:  90 capsule    Refill:  1   labetalol (NORMODYNE) 200 MG tablet    Sig: Take 1 tablet (200 mg total) by mouth 2 (two) times daily.    Dispense:  180 tablet    Refill:  1    Follow-up: Return in about 5 months (around 10/27/2023) for HTN and fatigue.    Anabel Halon, MD

## 2023-05-31 ENCOUNTER — Encounter (HOSPITAL_COMMUNITY): Payer: Self-pay

## 2023-05-31 LAB — LIPID PANEL
Chol/HDL Ratio: 5.4 ratio — ABNORMAL HIGH (ref 0.0–4.4)
Cholesterol, Total: 162 mg/dL (ref 100–199)
HDL: 30 mg/dL — ABNORMAL LOW (ref >39)
LDL Chol Calc (NIH): 109 mg/dL — ABNORMAL HIGH (ref 0–99)
Triglycerides: 128 mg/dL (ref 0–149)
VLDL Cholesterol Cal: 23 mg/dL (ref 5–40)

## 2023-05-31 LAB — CMP14+EGFR
ALT: 9 IU/L (ref 0–32)
AST: 10 IU/L (ref 0–40)
Albumin: 4 g/dL (ref 3.9–4.9)
Alkaline Phosphatase: 135 IU/L — ABNORMAL HIGH (ref 44–121)
BUN/Creatinine Ratio: 15 (ref 12–28)
BUN: 9 mg/dL (ref 8–27)
Bilirubin Total: 0.2 mg/dL (ref 0.0–1.2)
CO2: 23 mmol/L (ref 20–29)
Calcium: 9.1 mg/dL (ref 8.7–10.3)
Chloride: 103 mmol/L (ref 96–106)
Creatinine, Ser: 0.61 mg/dL (ref 0.57–1.00)
Globulin, Total: 2.5 g/dL (ref 1.5–4.5)
Glucose: 105 mg/dL — ABNORMAL HIGH (ref 70–99)
Potassium: 3.9 mmol/L (ref 3.5–5.2)
Sodium: 140 mmol/L (ref 134–144)
Total Protein: 6.5 g/dL (ref 6.0–8.5)
eGFR: 101 mL/min/{1.73_m2} (ref >59)

## 2023-05-31 LAB — HEMOGLOBIN A1C
Est. average glucose Bld gHb Est-mCnc: 131 mg/dL
Hgb A1c MFr Bld: 6.2 % — ABNORMAL HIGH (ref 4.8–5.6)

## 2023-05-31 LAB — TSH+FREE T4
Free T4: 1.24 ng/dL (ref 0.82–1.77)
TSH: 1.3 u[IU]/mL (ref 0.450–4.500)

## 2023-06-01 ENCOUNTER — Telehealth (HOSPITAL_COMMUNITY): Payer: Self-pay | Admitting: *Deleted

## 2023-06-01 NOTE — Telephone Encounter (Signed)
 Patient calling upcoming cardiac imaging study; pt verbalizes understanding of appt date/time but wanted to confirm the location.  She understands that she is to report to entrance and does not have any questions regarding her instructions.  Larey Brick RN Navigator Cardiac Imaging [facility] [phone] office [phone] cell

## 2023-06-02 ENCOUNTER — Ambulatory Visit (HOSPITAL_COMMUNITY)
Admission: RE | Admit: 2023-06-02 | Discharge: 2023-06-02 | Disposition: A | Discharge status: Home / Self Care | Payer: Medicaid Other | Source: Ambulatory Visit | Attending: Internal Medicine | Admitting: Internal Medicine

## 2023-06-02 DIAGNOSIS — I700 Atherosclerosis of aorta: Secondary | ICD-10-CM

## 2023-06-02 DIAGNOSIS — Z136 Encounter for screening for cardiovascular disorders: Secondary | ICD-10-CM | POA: Insufficient documentation

## 2023-06-02 MED ORDER — NITROGLYCERIN 0.4 MG SL SUBL
0.8000 mg | SUBLINGUAL_TABLET | Freq: Once | SUBLINGUAL | Status: AC
  Administered 2023-06-02: 0.8 mg via SUBLINGUAL

## 2023-06-02 MED ORDER — NITROGLYCERIN 0.4 MG SL SUBL
SUBLINGUAL_TABLET | SUBLINGUAL | Status: AC
  Filled 2023-06-02: qty 2

## 2023-06-02 MED ORDER — IOHEXOL 350 MG/ML SOLN
100.0000 mL | Freq: Once | INTRAVENOUS | Status: AC | PRN
  Administered 2023-06-02: 100 mL via INTRAVENOUS

## 2023-08-01 ENCOUNTER — Encounter (INDEPENDENT_AMBULATORY_CARE_PROVIDER_SITE_OTHER): Payer: Self-pay | Admitting: *Deleted

## 2023-08-31 DIAGNOSIS — M0579 Rheumatoid arthritis with rheumatoid factor of multiple sites without organ or systems involvement: Secondary | ICD-10-CM | POA: Diagnosis not present

## 2023-10-04 ENCOUNTER — Ambulatory Visit (HOSPITAL_COMMUNITY)
Admission: RE | Admit: 2023-10-04 | Discharge: 2023-10-04 | Disposition: A | Discharge status: Home / Self Care | Source: Ambulatory Visit | Attending: Internal Medicine | Admitting: Internal Medicine

## 2023-10-04 DIAGNOSIS — Z1231 Encounter for screening mammogram for malignant neoplasm of breast: Secondary | ICD-10-CM | POA: Insufficient documentation

## 2023-10-07 ENCOUNTER — Encounter (HOSPITAL_COMMUNITY): Payer: Self-pay

## 2023-10-07 ENCOUNTER — Emergency Department (HOSPITAL_COMMUNITY)

## 2023-10-07 ENCOUNTER — Other Ambulatory Visit: Payer: Self-pay

## 2023-10-07 ENCOUNTER — Emergency Department (HOSPITAL_COMMUNITY)
Admission: EM | Admit: 2023-10-07 | Discharge: 2023-10-07 | Disposition: A | Discharge status: Home / Self Care | Attending: Emergency Medicine | Admitting: Emergency Medicine

## 2023-10-07 DIAGNOSIS — M5412 Radiculopathy, cervical region: Secondary | ICD-10-CM | POA: Diagnosis not present

## 2023-10-07 DIAGNOSIS — M06011 Rheumatoid arthritis without rheumatoid factor, right shoulder: Secondary | ICD-10-CM | POA: Diagnosis not present

## 2023-10-07 DIAGNOSIS — M25511 Pain in right shoulder: Secondary | ICD-10-CM

## 2023-10-07 DIAGNOSIS — M19011 Primary osteoarthritis, right shoulder: Secondary | ICD-10-CM | POA: Diagnosis not present

## 2023-10-07 DIAGNOSIS — E042 Nontoxic multinodular goiter: Secondary | ICD-10-CM | POA: Diagnosis not present

## 2023-10-07 DIAGNOSIS — Z79899 Other long term (current) drug therapy: Secondary | ICD-10-CM | POA: Diagnosis not present

## 2023-10-07 DIAGNOSIS — M25512 Pain in left shoulder: Secondary | ICD-10-CM | POA: Diagnosis not present

## 2023-10-07 DIAGNOSIS — M06012 Rheumatoid arthritis without rheumatoid factor, left shoulder: Secondary | ICD-10-CM | POA: Diagnosis not present

## 2023-10-07 DIAGNOSIS — M19012 Primary osteoarthritis, left shoulder: Secondary | ICD-10-CM | POA: Diagnosis not present

## 2023-10-07 DIAGNOSIS — M4182 Other forms of scoliosis, cervical region: Secondary | ICD-10-CM | POA: Diagnosis not present

## 2023-10-07 DIAGNOSIS — M069 Rheumatoid arthritis, unspecified: Secondary | ICD-10-CM

## 2023-10-07 DIAGNOSIS — M2578 Osteophyte, vertebrae: Secondary | ICD-10-CM | POA: Diagnosis not present

## 2023-10-07 MED ORDER — OXYCODONE-ACETAMINOPHEN 5-325 MG PO TABS
1.0000 | ORAL_TABLET | Freq: Once | ORAL | Status: AC
  Administered 2023-10-07: 1 via ORAL
  Filled 2023-10-07: qty 1

## 2023-10-07 MED ORDER — HYDROCODONE-ACETAMINOPHEN 5-325 MG PO TABS: 1.0000 | ORAL_TABLET | Freq: Four times a day (QID) | ORAL | 0 refills | Status: DC | PRN

## 2023-10-07 MED ORDER — PREDNISONE 10 MG PO TABS
40.0000 mg | ORAL_TABLET | Freq: Every day | ORAL | 0 refills | Status: AC
Start: 2023-10-07 — End: 2023-10-12

## 2023-10-07 MED ORDER — PREDNISONE 50 MG PO TABS
60.0000 mg | ORAL_TABLET | Freq: Once | ORAL | Status: AC
  Administered 2023-10-07: 60 mg via ORAL
  Filled 2023-10-07: qty 1

## 2023-10-07 NOTE — ED Triage Notes (Signed)
 Pt c/o bilateral shoulder pain, rt should is worse than left, onset approx. 1-2 weeks ago. Denies injury; hx of arthritis, states rheumatologist said I have arthritis. Denies N/V/D, denies any other symptoms. Hx of hypertension, BP 186/92 (MAP 120).

## 2023-10-07 NOTE — Discharge Instructions (Signed)
 Please follow-up closely with your primary care doctor on an outpatient basis.  Return to the emergency department immediately for any new or worsening symptoms.  Please start taking the prednisone  you are prescribed tomorrow as you are already given today's dose.

## 2023-10-07 NOTE — ED Provider Notes (Signed)
 [place] EMERGENCY DEPARTMENT AT [facility] Provider Note   CSN: [id number] Arrival date & time: 10/07/23  9152     Patient presents with: Shoulder Pain   Alejandra Sanders is a 63 y.o. female.   Patient is a 63 year old female with a past medical history of rheumatoid arthritis who presents to the emergency department with a chief complaint of bilateral shoulder pain and neck pain which has been ongoing for approximate the past week.  She notes that the pain is worse with movement of her right arm.  She denies any recent falls or blunt trauma.  She denies any history of similar symptoms in the past.  She has had no numbness or paresthesias.  She denies any abnormal headaches, dizziness, lightheadedness.  She has had no associated chest pain or shortness of breath.   Shoulder Pain Associated symptoms: neck pain        Prior to Admission medications   Medication Sig Start Date End Date Taking? Authorizing Provider  albuterol  (VENTOLIN  HFA) 108 (90 Base) MCG/ACT inhaler Inhale 1-2 puffs into the lungs every 6 (six) hours as needed for wheezing or shortness of breath. 05/30/23   Tobie Suzzane POUR, MD  amLODipine  (NORVASC ) 10 MG tablet Take 1 tablet (10 mg total) by mouth daily. 05/30/23   Tobie Suzzane POUR, MD  diclofenac Sodium (VOLTAREN) 1 % GEL See admin instructions. 10/17/16   [provider]  hydrochlorothiazide  (MICROZIDE ) 12.5 MG capsule Take 1 capsule (12.5 mg total) by mouth daily. 05/30/23   Patel, Rutwik K, MD  hydroxychloroquine (PLAQUENIL) 200 MG tablet Take 2 tablets by mouth daily. 04/17/18   [provider]  labetalol  (NORMODYNE ) 200 MG tablet Take 1 tablet (200 mg total) by mouth 2 (two) times daily. 05/30/23   Tobie Suzzane POUR, MD  methocarbamol  (ROBAXIN ) 500 MG tablet Take 1 tablet (500 mg total) by mouth every 6 (six) hours as needed for muscle spasms. 01/22/23   Suellen Cantor A, PA-C  metoprolol  tartrate (LOPRESSOR ) 100 MG tablet Take 1 tablet by  mouth two hours prior to CT scan. 05/17/23   Mallipeddi, Vishnu P, MD  mometasone  (NASONEX ) 50 MCG/ACT nasal spray Place 1 spray into the nose at bedtime. 07/07/22   Jude Harden GAILS, MD  naproxen  (NAPROSYN ) 500 MG tablet Take 1 tablet (500 mg total) by mouth 2 (two) times daily. 05/30/23   Tobie Suzzane POUR, MD  ORENCIA  CLICKJECT 125 MG/ML SOAJ Inject 1 pen into the skin once a week. Takes on Tuesdays 06/26/18   Kallie Manuelita BROCKS, MD  rosuvastatin  (CRESTOR ) 5 MG tablet Take 1 tablet (5 mg total) by mouth daily. 01/30/23   Tobie Suzzane POUR, MD  Vitamin D , Ergocalciferol , (DRISDOL ) 1.25 MG (50000 UNIT) CAPS capsule Take 1 capsule (50,000 Units total) by mouth every 7 (seven) days. 01/30/23   Tobie Suzzane POUR, MD    Allergies: Lisinopril    Review of Systems  Musculoskeletal:  Positive for neck pain.       Pain to bilateral shoulders  All other systems reviewed and are negative.   Updated Vital Signs BP (!) 186/92 (BP Location: Left Arm)   Pulse 86   Temp 97.9 F (36.6 C) (Oral)   Resp 16   Ht 5' 5 (1.651 m)   Wt 93 kg   SpO2 99%   BMI 34.11 kg/m   Physical Exam Vitals and nursing note reviewed.  Constitutional:      Appearance: Normal appearance.  HENT:     Head:  Normocephalic and atraumatic.     Nose: Nose normal.     Mouth/Throat:     Mouth: Mucous membranes are moist.  Eyes:     Extraocular Movements: Extraocular movements intact.     Conjunctiva/sclera: Conjunctivae normal.     Pupils: Pupils are equal, round, and reactive to light.  Neck:     Comments: No midline tenderness, no step-off or deformity, tenderness palpation over right trapezius Cardiovascular:     Rate and Rhythm: Normal rate and regular rhythm.     Pulses: Normal pulses.     Heart sounds: Normal heart sounds. No murmur heard.    No gallop.  Pulmonary:     Effort: Pulmonary effort is normal. No respiratory distress.     Breath sounds: Normal breath sounds. No stridor. No wheezing, rhonchi or rales.  Chest:      Chest wall: No tenderness.  Musculoskeletal:        General: Normal range of motion.     Cervical back: Normal range of motion and neck supple.     Comments: Tenderness palpation noted over anterior aspect of right shoulder, nontender palpation remainder bilateral upper and lower extremities, radial pulse 2+ in bilateral upper extremities, cap refill less than 2 seconds distally, sensation intact distally, radial, ulnar, median, axillary nerve function intact distally, full active and passive range of motion at right shoulder, no obvious deformity or bruising, no skin breakdown or ulceration, no lacerations or abrasions, no overlying erythema or warmth, no edema bilateral upper extremities  Skin:    General: Skin is warm and dry.  Neurological:     General: No focal deficit present.     Mental Status: She is alert and oriented to person, place, and time. Mental status is at baseline.  Psychiatric:        Mood and Affect: Mood normal.        Behavior: Behavior normal.        Thought Content: Thought content normal.        Judgment: Judgment normal.     (all labs ordered are listed, but only abnormal results are displayed) Labs Reviewed - No data to display  EKG: None  Radiology: No results found.   Procedures   Medications Ordered in the ED  oxyCODONE -acetaminophen  (PERCOCET/ROXICET) 5-325 MG per tablet 1 tablet (has no administration in time range)  predniSONE  (DELTASONE ) tablet 60 mg (has no administration in time range)                                    Medical Decision Making Amount and/or Complexity of Data Reviewed Radiology: ordered.  Risk Prescription drug management.   This patient presents to the ED for concern of neck pain, bilateral shoulder pain differential diagnosis includes rheumatoid arthritis flare, cervical radiculopathy, muscle strain, muscle spasm, DVT    Additional history obtained:  Additional history obtained from family External  records from outside source obtained and reviewed including medical records    Imaging Studies ordered:  I ordered imaging studies including CT scan of neck, x-ray bilateral shoulders I independently visualized and interpreted imaging which showed no acute osseous injury or lesions, degenerative changes noted to bilateral shoulders I agree with the radiologist interpretation   Medicines ordered and prescription drug management:  I ordered medication including Percocet, prednisone  for acute pain and rheumatoid arthritis Reevaluation of the patient after these medicines showed that the patient improved I have  reviewed the patients home medicines and have made adjustments as needed   Problem List / ED Course:  Patient is doing well at this time and is stable for discharge home.  Discussed with patient symptoms appear to be secondary to either cervical radiculopathy versus a flare of her rheumatoid arthritis.  Will continue symptomatic treatment on an outpatient basis.  Images were overall unremarkable.  Do not suspect DVT at this point as patient has no swelling of bilateral upper extremities.  She has strong peripheral pulses with no indication for acute arterial insufficiency.  Do not suspect thoracic outlet syndrome.  Patient has no obvious indication for septic joint or gout.  Discussed with patient that the need for continued close follow-up with her primary care doctor on an outpatient basis.  Strict precautions were provided for any new or worsening symptoms.  Patient voiced understanding and had no additional questions.  Patient has had no associated chest pain or shortness of breath and have low suspicion for underlying cardiac process at this point.   Social Determinants of Health:  None        Final diagnoses:  None    ED Discharge Orders     None          Daralene Lonni JONETTA DEVONNA 10/07/23 1025    Suzette Pac, MD 10/08/23 [phone]

## 2023-10-12 ENCOUNTER — Inpatient Hospital Stay: Payer: Self-pay | Admitting: Nurse Practitioner

## 2023-10-16 ENCOUNTER — Ambulatory Visit: Admitting: Internal Medicine

## 2023-10-16 ENCOUNTER — Encounter: Payer: Self-pay | Admitting: Internal Medicine

## 2023-10-16 VITALS — BP 138/82 | HR 102 | Ht 65.0 in | Wt 204.6 lb

## 2023-10-16 DIAGNOSIS — M19012 Primary osteoarthritis, left shoulder: Secondary | ICD-10-CM

## 2023-10-16 DIAGNOSIS — M25562 Pain in left knee: Secondary | ICD-10-CM

## 2023-10-16 DIAGNOSIS — M19011 Primary osteoarthritis, right shoulder: Secondary | ICD-10-CM | POA: Diagnosis not present

## 2023-10-16 DIAGNOSIS — M25561 Pain in right knee: Secondary | ICD-10-CM

## 2023-10-16 DIAGNOSIS — Z09 Encounter for follow-up examination after completed treatment for conditions other than malignant neoplasm: Secondary | ICD-10-CM

## 2023-10-16 DIAGNOSIS — M069 Rheumatoid arthritis, unspecified: Secondary | ICD-10-CM

## 2023-10-16 DIAGNOSIS — M4810 Ankylosing hyperostosis [Forestier], site unspecified: Secondary | ICD-10-CM

## 2023-10-16 MED ORDER — PREDNISONE 10 MG (21) PO TBPK: ORAL_TABLET | ORAL | 0 refills | Status: DC

## 2023-10-16 NOTE — Progress Notes (Unsigned)
 Established Patient Office Visit  Subjective:  Patient ID: Alejandra Sanders, female    DOB: 1960-09-05  Age: 63 y.o. MRN: [id number]  CC:  Chief Complaint  Patient presents with   Follow-up    ER f/u , reports shoulder pain feeling better. Reports knees hurt when she sits and stands.     HPI Alejandra Sanders is a 63 y.o. female with past medical history of HTN, RA, toxic thyroid  nodule and tobacco abuse who is presents for f/u of recent ER visit for b/l shoulder pain.  She went to ER for complaint of bilateral shoulder pain, radiating from neck area for the 1 week prior to ER visit.  Her pain was worse with movement of right arm.  Denied any recent trauma or fall.  She was given Norco and oral prednisone  from the ER, which she has completed now.  She has noticed mild improvement in the neck pain, but still has bilateral scapular area pain.  Today, she also reports bilateral knee pain, which is worse since stopping oral prednisone .  Pain is constant, dull, associated with mild swelling and worse with walking.  Of note, she has history of rheumatoid arthritis and is on Orencia .  She denies any new numbness or tingling of the UE.  Past Medical History:  Diagnosis Date   Arthritis    Calculus of gallbladder with chronic cholecystitis without obstruction    Chronic cholecystitis 06/11/2018   Hypertension    Seizure (HCC)    had seizurea as child; unknow etiology; was on phenobarbital for a few years and then was taken off meds. No seizure in 40 years.   Thyroid  disease    Trichimoniasis 02/11/2021   Treated with flagyl  02/11/21    Past Surgical History:  Procedure Laterality Date   CHOLECYSTECTOMY N/A 06/11/2018   Procedure: LAPAROSCOPIC CHOLECYSTECTOMY;  Surgeon: Kallie Manuelita BROCKS, MD;  Location: [facility];  Service: General;  Laterality: N/A;   GALLBLADDER SURGERY  06/11/2018   THYROID  SURGERY     removal of thyroid     Family History  Problem Relation Age of Onset   Aneurysm  Maternal Grandmother    Hypertension Father    Hypertension Mother    Diabetes Mother    Heart attack Brother    Hypertension Sister    Kidney failure Sister    Hypertension Sister    Hypertension Sister    Hypertension Son     Social History   Socioeconomic History   Marital status: Single    Spouse name: Not on file   Number of children: Not on file   Years of education: Not on file   Highest education level: Not on file  Occupational History   Not on file  Tobacco Use   Smoking status: Former    Current packs/day: 0.00    Average packs/day: 0.3 packs/day for 35.0 years (8.8 ttl pk-yrs)    Types: Cigarettes    Quit date: 08/18/2023    Years since quitting: 0.1   Smokeless tobacco: Never   Tobacco comments:    states smokes 3 cig daily only  Substance and Sexual Activity   Alcohol use: No   Drug use: No   Sexual activity: Not Currently    Birth control/protection: Post-menopausal  Other Topics Concern   Not on file  Social History Narrative   Not on file   Social Drivers of Health   Financial Resource Strain: Low Risk  (02/08/2021)   Overall Financial Resource Strain (CARDIA)  Difficulty of Paying Living Expenses: Not hard at all  Food Insecurity: No Food Insecurity (02/08/2021)   Hunger Vital Sign    Worried About Running Out of Food in the Last Year: Never true    Ran Out of Food in the Last Year: Never true  Transportation Needs: No Transportation Needs (02/08/2021)   PRAPARE - Administrator, Civil Service (Medical): No    Lack of Transportation (Non-Medical): No  Physical Activity: Insufficiently Active (02/08/2021)   Exercise Vital Sign    Days of Exercise per Week: 2 days    Minutes of Exercise per Session: 20 min  Stress: No Stress Concern Present (02/08/2021)   [employer] of Occupational Health - Occupational Stress Questionnaire    Feeling of Stress : Only a little  Social Connections: Moderately Integrated (02/08/2021)   Social  Connection and Isolation Panel    Frequency of Communication with Friends and Family: More than three times a week    Frequency of Social Gatherings with Friends and Family: More than three times a week    Attends Religious Services: More than 4 times per year    Active Member of [employer] or Organizations: Yes    Attends Banker Meetings: More than 4 times per year    Marital Status: Never married  Intimate Partner Violence: Not At Risk (02/08/2021)   Humiliation, Afraid, Rape, and Kick questionnaire    Fear of Current or Ex-Partner: No    Emotionally Abused: No    Physically Abused: No    Sexually Abused: No    Outpatient Medications Prior to Visit  Medication Sig Dispense Refill   albuterol  (VENTOLIN  HFA) 108 (90 Base) MCG/ACT inhaler Inhale 1-2 puffs into the lungs every 6 (six) hours as needed for wheezing or shortness of breath. 18 g 1   amLODipine  (NORVASC ) 10 MG tablet Take 1 tablet (10 mg total) by mouth daily. 90 tablet 1   diclofenac Sodium (VOLTAREN) 1 % GEL See admin instructions.     hydrochlorothiazide  (MICROZIDE ) 12.5 MG capsule Take 1 capsule (12.5 mg total) by mouth daily. 90 capsule 1   HYDROcodone -acetaminophen  (NORCO/VICODIN) 5-325 MG tablet Take 1 tablet by mouth every 6 (six) hours as needed for severe pain (pain score 7-10). 10 tablet 0   hydroxychloroquine (PLAQUENIL) 200 MG tablet Take 2 tablets by mouth daily.     labetalol  (NORMODYNE ) 200 MG tablet Take 1 tablet (200 mg total) by mouth 2 (two) times daily. 180 tablet 1   methocarbamol  (ROBAXIN ) 500 MG tablet Take 1 tablet (500 mg total) by mouth every 6 (six) hours as needed for muscle spasms. 20 tablet 0   metoprolol  tartrate (LOPRESSOR ) 100 MG tablet Take 1 tablet by mouth two hours prior to CT scan. 1 tablet 0   mometasone  (NASONEX ) 50 MCG/ACT nasal spray Place 1 spray into the nose at bedtime. 1 each 0   naproxen  (NAPROSYN ) 500 MG tablet Take 1 tablet (500 mg total) by mouth 2 (two) times daily. 30  tablet 1   ORENCIA  CLICKJECT 125 MG/ML SOAJ Inject 1 pen into the skin once a week. Takes on Tuesdays     rosuvastatin  (CRESTOR ) 5 MG tablet Take 1 tablet (5 mg total) by mouth daily. 90 tablet 1   Vitamin D , Ergocalciferol , (DRISDOL ) 1.25 MG (50000 UNIT) CAPS capsule Take 1 capsule (50,000 Units total) by mouth every 7 (seven) days. 12 capsule 1   No facility-administered medications prior to visit.    Allergies  Allergen  Reactions   Lisinopril Swelling    ROS Review of Systems  Constitutional:  Positive for fatigue. Negative for chills and fever.  HENT:  Negative for congestion, postnasal drip, sinus pressure and sinus pain.   Eyes:  Negative for pain and discharge.  Respiratory:  Positive for shortness of breath (Exertional). Negative for cough.   Cardiovascular:  Negative for chest pain and palpitations.  Gastrointestinal:  Negative for abdominal pain, diarrhea, nausea and vomiting.  Endocrine: Negative for polydipsia and polyuria.  Genitourinary:  Negative for dysuria and hematuria.  Musculoskeletal:  Positive for arthralgias, back pain and neck pain. Negative for neck stiffness.  Skin:  Negative for rash.  Neurological:  Negative for dizziness and weakness.  Psychiatric/Behavioral:  Positive for sleep disturbance. Negative for agitation and behavioral problems.       Objective:    Physical Exam Vitals reviewed.  Constitutional:      General: She is not in acute distress.    Appearance: She is not diaphoretic.  HENT:     Head: Normocephalic and atraumatic.     Nose: No congestion.     Mouth/Throat:     Mouth: Mucous membranes are moist.     Pharynx: No posterior oropharyngeal erythema.  Eyes:     General: No scleral icterus.    Extraocular Movements: Extraocular movements intact.  Cardiovascular:     Rate and Rhythm: Normal rate and regular rhythm.     Heart sounds: Normal heart sounds. No murmur heard. Pulmonary:     Breath sounds: No wheezing or rales.   Musculoskeletal:        General: Tenderness (Lumbar spine area and right paraspinal area) present.     Cervical back: Neck supple. No tenderness. Pain with movement present.     Right lower leg: No edema.     Left lower leg: No edema.  Skin:    General: Skin is warm.     Findings: No rash.  Neurological:     General: No focal deficit present.     Mental Status: She is alert and oriented to person, place, and time.     Sensory: No sensory deficit.     Motor: No weakness.  Psychiatric:        Mood and Affect: Mood normal.        Behavior: Behavior normal.     BP 138/82   Pulse (!) 102   Ht 5' 5 (1.651 m)   Wt 204 lb 9.6 oz (92.8 kg)   SpO2 97%   BMI 34.05 kg/m  Wt Readings from Last 3 Encounters:  10/16/23 204 lb 9.6 oz (92.8 kg)  10/07/23 205 lb (93 kg)  05/30/23 201 lb (91.2 kg)    Lab Results  Component Value Date   TSH 1.300 05/30/2023   Lab Results  Component Value Date   WBC 5.9 01/23/2023   HGB 13.5 01/23/2023   HCT 41.2 01/23/2023   MCV 81 01/23/2023   PLT 295 01/23/2023   Lab Results  Component Value Date   NA 140 05/30/2023   K 3.9 05/30/2023   CO2 23 05/30/2023   GLUCOSE 105 (H) 05/30/2023   BUN 9 05/30/2023   CREATININE 0.61 05/30/2023   BILITOT 0.2 05/30/2023   ALKPHOS 135 (H) 05/30/2023   AST 10 05/30/2023   ALT 9 05/30/2023   PROT 6.5 05/30/2023   ALBUMIN 4.0 05/30/2023   CALCIUM  9.1 05/30/2023   ANIONGAP 8 06/13/2022   EGFR 101 05/30/2023   Lab Results  Component Value Date   CHOL 162 05/30/2023   Lab Results  Component Value Date   HDL 30 (L) 05/30/2023   Lab Results  Component Value Date   LDLCALC 109 (H) 05/30/2023   Lab Results  Component Value Date   TRIG 128 05/30/2023   Lab Results  Component Value Date   CHOLHDL 5.4 (H) 05/30/2023   Lab Results  Component Value Date   HGBA1C 6.2 (H) 05/30/2023      Assessment & Plan:   Problem List Items Addressed This Visit       Musculoskeletal and Integument    Rheumatoid arthritis (HCC)   Followed by Rheumatology On Plaquenil and Orencia  Needs to follow up with Ophthalmology      Relevant Medications   predniSONE  (STERAPRED UNI-PAK 21 TAB) 10 MG (21) TBPK tablet   Primary osteoarthritis of both shoulders   X-ray of shoulder showed OA Followed by rheumatology for rheumatoid arthritis She takes naproxen  as needed for pain Started prednisone  taper considering recent worsening of neck pain and knee pain, advised to avoid taking naproxen  while taking prednisone       Relevant Medications   predniSONE  (STERAPRED UNI-PAK 21 TAB) 10 MG (21) TBPK tablet   DISH (diffuse idiopathic skeletal hyperostosis)   Noted on recent CT of cervical spine Also has history of RA, followed by rheumatology Due to recent worsening of neck pain, will extend oral prednisone  taper Robaxin  as needed for muscle spasms        Other   Encounter for examination following treatment at hospital - Primary   ER chart reviewed, including imaging Has completed oral prednisone , will extend it due to persistent symptoms as tapering dose      Acute pain of both knees   Likely due to OA of knee, also has h/o RA She recently had oral prednisone , which may have masked the knee pain while taking it Will extend prednisone  as tapering dose for now Advised to follow-up with rheumatology      Relevant Medications   predniSONE  (STERAPRED UNI-PAK 21 TAB) 10 MG (21) TBPK tablet    Meds ordered this encounter  Medications   predniSONE  (STERAPRED UNI-PAK 21 TAB) 10 MG (21) TBPK tablet    Sig: Take as package instructions.    Dispense:  1 each    Refill:  0    Follow-up: Return if symptoms worsen or fail to improve.    Alejandra MARLA Blanch, MD

## 2023-10-16 NOTE — Patient Instructions (Signed)
 Please start taking Prednisone as prescribed.  Please take Naproxen as needed for pain after completing Prednisone.

## 2023-10-20 DIAGNOSIS — M4810 Ankylosing hyperostosis [Forestier], site unspecified: Secondary | ICD-10-CM | POA: Insufficient documentation

## 2023-10-20 DIAGNOSIS — M25561 Pain in right knee: Secondary | ICD-10-CM | POA: Insufficient documentation

## 2023-10-20 DIAGNOSIS — M19012 Primary osteoarthritis, left shoulder: Secondary | ICD-10-CM | POA: Insufficient documentation

## 2023-10-20 NOTE — Assessment & Plan Note (Signed)
Followed by Rheumatology On Plaquenil and Orencia Needs to follow up with Ophthalmology 

## 2023-10-20 NOTE — Assessment & Plan Note (Signed)
 Likely due to OA of knee, also has h/o RA She recently had oral prednisone , which may have masked the knee pain while taking it Will extend prednisone  as tapering dose for now Advised to follow-up with rheumatology

## 2023-10-20 NOTE — Assessment & Plan Note (Signed)
 Noted on recent CT of cervical spine Also has history of RA, followed by rheumatology Due to recent worsening of neck pain, will extend oral prednisone  taper Robaxin  as needed for muscle spasms

## 2023-10-20 NOTE — Assessment & Plan Note (Signed)
 X-ray of shoulder showed OA Followed by rheumatology for rheumatoid arthritis She takes naproxen  as needed for pain Started prednisone  taper considering recent worsening of neck pain and knee pain, advised to avoid taking naproxen  while taking prednisone 

## 2023-10-20 NOTE — Assessment & Plan Note (Signed)
 ER chart reviewed, including imaging Has completed oral prednisone , will extend it due to persistent symptoms as tapering dose

## 2023-11-08 ENCOUNTER — Encounter: Payer: Self-pay | Admitting: Internal Medicine

## 2023-11-08 ENCOUNTER — Encounter (INDEPENDENT_AMBULATORY_CARE_PROVIDER_SITE_OTHER): Payer: Self-pay | Admitting: *Deleted

## 2023-11-08 ENCOUNTER — Ambulatory Visit: Payer: Medicaid Other | Admitting: Internal Medicine

## 2023-11-08 VITALS — BP 123/79 | HR 99 | Ht 65.0 in | Wt 205.0 lb

## 2023-11-08 DIAGNOSIS — M19012 Primary osteoarthritis, left shoulder: Secondary | ICD-10-CM

## 2023-11-08 DIAGNOSIS — R5382 Chronic fatigue, unspecified: Secondary | ICD-10-CM | POA: Diagnosis not present

## 2023-11-08 DIAGNOSIS — M069 Rheumatoid arthritis, unspecified: Secondary | ICD-10-CM

## 2023-11-08 DIAGNOSIS — M4322 Fusion of spine, cervical region: Secondary | ICD-10-CM | POA: Insufficient documentation

## 2023-11-08 DIAGNOSIS — I10 Essential (primary) hypertension: Secondary | ICD-10-CM | POA: Diagnosis not present

## 2023-11-08 DIAGNOSIS — Z1211 Encounter for screening for malignant neoplasm of colon: Secondary | ICD-10-CM

## 2023-11-08 DIAGNOSIS — R7303 Prediabetes: Secondary | ICD-10-CM | POA: Diagnosis not present

## 2023-11-08 DIAGNOSIS — M19011 Primary osteoarthritis, right shoulder: Secondary | ICD-10-CM | POA: Diagnosis not present

## 2023-11-08 MED ORDER — BACLOFEN 10 MG PO TABS: 10.0000 mg | ORAL_TABLET | Freq: Two times a day (BID) | ORAL | 2 refills | Status: AC | PRN

## 2023-11-08 NOTE — Progress Notes (Signed)
 Established Patient Office Visit  Subjective:  Patient ID: Alejandra Sanders, female    DOB: [birth year]  Age: 63 y.o. MRN: [id number]  CC:  Chief Complaint  Patient presents with   Hypertension    5 month f/u    Fatigue    5 month f/u    Neck Pain    Pt reports sx of left neck uncomfortableness like spasms, struggles to lift left arm.     HPI Alejandra Sanders is a 63 y.o. female with past medical history of HTN, RA, toxic thyroid  nodule and tobacco abuse who presents for f/u of her chronic medical conditions.  HTN: BP is well-controlled. Takes medications regularly. Patient denies headache, dizziness, chest pain, or palpitations.  She is undergoing evaluation of CAD due to exertional dyspnea.  She had been nuclear stress test, which was intermediate. She is followed by Cardiology. She takes Crestor  for HLD.  She has a history of RA, for which she takes Plaquenil and Orencia . She follows up with rheumatologist in [place]. Denies any vision problem currently.  She complains of bilateral knee pain, which is worse with walking and prolonged standing. She still c/o bilateral shoulder pain, radiating from neck area for the 1 week prior to ER visit in 07/25. She was given oral Prednisone  in the last visit, which had improved her symptoms initially.  She reports neck muscle spasms at times.  Denies any numbness or tingling of the UE.   She has had RAI ablation of toxic thyroid  nodule.  Her TSH and free T4 have been WNL without needing thyroid  supplement.   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  She wakes up due to the movement of her parents walking in her home at nighttime.  She feels tired throughout the day.  She was given Elavil  for insomnia and chronic fatigue, but had drowsiness with it. She denies any recent change in her appetite or weight, fever, chills, chronic cough, hemoptysis, night sweats or LAD.    Past Medical History:  Diagnosis Date    Arthritis    Calculus of gallbladder with chronic cholecystitis without obstruction    Chronic cholecystitis 06/11/2018   Hypertension    Seizure (HCC)    had seizurea as child; unknow etiology; was on phenobarbital for a few years and then was taken off meds. No seizure in 40 years.   Thyroid  disease    Trichimoniasis 02/11/2021   Treated with flagyl  02/11/21    Past Surgical History:  Procedure Laterality Date   CHOLECYSTECTOMY N/A 06/11/2018   Procedure: LAPAROSCOPIC CHOLECYSTECTOMY;  Surgeon: Kallie Manuelita BROCKS, MD;  Location: [facility];  Service: General;  Laterality: N/A;   GALLBLADDER SURGERY  06/11/2018   THYROID  SURGERY     removal of thyroid     Family History  Problem Relation Age of Onset   Aneurysm Maternal Grandmother    Hypertension Father    Hypertension Mother    Diabetes Mother    Heart attack Brother    Hypertension Sister    Kidney failure Sister    Hypertension Sister    Hypertension Sister    Hypertension Son     Social History   Socioeconomic History   Marital status: Single    Spouse name: Not on file   Number of children: Not on file   Years of education: Not on file   Highest education level: Not on file  Occupational History   Not on file  Tobacco Use  Smoking status: Former    Current packs/day: 0.00    Average packs/day: 0.3 packs/day for 35.0 years (8.8 ttl pk-yrs)    Types: Cigarettes    Quit date: 08/18/2023    Years since quitting: 0.2   Smokeless tobacco: Never   Tobacco comments:    states smokes 3 cig daily only  Substance and Sexual Activity   Alcohol use: No   Drug use: No   Sexual activity: Not Currently    Birth control/protection: Post-menopausal  Other Topics Concern   Not on file  Social History Narrative   Not on file   Social Drivers of Health   Financial Resource Strain: Low Risk  (02/08/2021)   Overall Financial Resource Strain (CARDIA)    Difficulty of Paying Living Expenses: Not hard at all  Food  Insecurity: No Food Insecurity (02/08/2021)   Hunger Vital Sign    Worried About Running Out of Food in the Last Year: Never true    Ran Out of Food in the Last Year: Never true  Transportation Needs: No Transportation Needs (02/08/2021)   PRAPARE - Administrator, Civil Service (Medical): No    Lack of Transportation (Non-Medical): No  Physical Activity: Insufficiently Active (02/08/2021)   Exercise Vital Sign    Days of Exercise per Week: 2 days    Minutes of Exercise per Session: 20 min  Stress: No Stress Concern Present (02/08/2021)   [employer] of Occupational Health - Occupational Stress Questionnaire    Feeling of Stress : Only a little  Social Connections: Moderately Integrated (02/08/2021)   Social Connection and Isolation Panel    Frequency of Communication with Friends and Family: More than three times a week    Frequency of Social Gatherings with Friends and Family: More than three times a week    Attends Religious Services: More than 4 times per year    Active Member of [employer] or Organizations: Yes    Attends Banker Meetings: More than 4 times per year    Marital Status: Never married  Intimate Partner Violence: Not At Risk (02/08/2021)   Humiliation, Afraid, Rape, and Kick questionnaire    Fear of Current or Ex-Partner: No    Emotionally Abused: No    Physically Abused: No    Sexually Abused: No    Outpatient Medications Prior to Visit  Medication Sig Dispense Refill   albuterol  (VENTOLIN  HFA) 108 (90 Base) MCG/ACT inhaler Inhale 1-2 puffs into the lungs every 6 (six) hours as needed for wheezing or shortness of breath. 18 g 1   amLODipine  (NORVASC ) 10 MG tablet Take 1 tablet (10 mg total) by mouth daily. 90 tablet 1   diclofenac Sodium (VOLTAREN) 1 % GEL See admin instructions.     hydrochlorothiazide  (MICROZIDE ) 12.5 MG capsule Take 1 capsule (12.5 mg total) by mouth daily. 90 capsule 1   HYDROcodone -acetaminophen  (NORCO/VICODIN) 5-325 MG  tablet Take 1 tablet by mouth every 6 (six) hours as needed for severe pain (pain score 7-10). 10 tablet 0   hydroxychloroquine (PLAQUENIL) 200 MG tablet Take 2 tablets by mouth daily.     labetalol  (NORMODYNE ) 200 MG tablet Take 1 tablet (200 mg total) by mouth 2 (two) times daily. 180 tablet 1   metoprolol  tartrate (LOPRESSOR ) 100 MG tablet Take 1 tablet by mouth two hours prior to CT scan. 1 tablet 0   mometasone  (NASONEX ) 50 MCG/ACT nasal spray Place 1 spray into the nose at bedtime. 1 each 0  naproxen  (NAPROSYN ) 500 MG tablet Take 1 tablet (500 mg total) by mouth 2 (two) times daily. 30 tablet 1   ORENCIA  CLICKJECT 125 MG/ML SOAJ Inject 1 pen into the skin once a week. Takes on Tuesdays     rosuvastatin  (CRESTOR ) 5 MG tablet Take 1 tablet (5 mg total) by mouth daily. 90 tablet 1   Vitamin D , Ergocalciferol , (DRISDOL ) 1.25 MG (50000 UNIT) CAPS capsule Take 1 capsule (50,000 Units total) by mouth every 7 (seven) days. 12 capsule 1   methocarbamol  (ROBAXIN ) 500 MG tablet Take 1 tablet (500 mg total) by mouth every 6 (six) hours as needed for muscle spasms. 20 tablet 0   predniSONE  (STERAPRED UNI-PAK 21 TAB) 10 MG (21) TBPK tablet Take as package instructions. 1 each 0   No facility-administered medications prior to visit.    Allergies  Allergen Reactions   Lisinopril Swelling    ROS Review of Systems  Constitutional:  Positive for fatigue. Negative for chills and fever.  HENT:  Negative for congestion, postnasal drip, sinus pressure and sinus pain.   Eyes:  Negative for pain and discharge.  Respiratory:  Positive for shortness of breath (Exertional). Negative for cough.   Cardiovascular:  Negative for chest pain and palpitations.  Gastrointestinal:  Negative for abdominal pain, diarrhea, nausea and vomiting.  Endocrine: Negative for polydipsia and polyuria.  Genitourinary:  Negative for dysuria and hematuria.  Musculoskeletal:  Positive for arthralgias, back pain and neck pain.  Negative for neck stiffness.  Skin:  Negative for rash.  Neurological:  Negative for dizziness and weakness.  Psychiatric/Behavioral:  Positive for sleep disturbance. Negative for agitation and behavioral problems.       Objective:    Physical Exam Vitals reviewed.  Constitutional:      General: She is not in acute distress.    Appearance: She is not diaphoretic.  HENT:     Head: Normocephalic and atraumatic.     Nose: No congestion.     Mouth/Throat:     Mouth: Mucous membranes are moist.     Pharynx: No posterior oropharyngeal erythema.  Eyes:     General: No scleral icterus.    Extraocular Movements: Extraocular movements intact.  Cardiovascular:     Rate and Rhythm: Normal rate and regular rhythm.     Heart sounds: Normal heart sounds. No murmur heard. Pulmonary:     Breath sounds: No wheezing or rales.  Musculoskeletal:        General: Tenderness (Lumbar spine area and right paraspinal area) present.     Cervical back: Neck supple. Tenderness present. Pain with movement present.     Right lower leg: No edema.     Left lower leg: No edema.  Skin:    General: Skin is warm.     Findings: No rash.  Neurological:     General: No focal deficit present.     Mental Status: She is alert and oriented to person, place, and time.     Sensory: No sensory deficit.     Motor: No weakness.  Psychiatric:        Mood and Affect: Mood normal.        Behavior: Behavior normal.     BP 123/79   Pulse 99   Ht 5' 5 (1.651 m)   Wt 205 lb (93 kg)   SpO2 93%   BMI 34.11 kg/m  Wt Readings from Last 3 Encounters:  11/08/23 205 lb (93 kg)  10/16/23 204 lb 9.6 oz (92.8  kg)  10/07/23 205 lb (93 kg)    Lab Results  Component Value Date   TSH 1.300 05/30/2023   Lab Results  Component Value Date   WBC 5.9 01/23/2023   HGB 13.5 01/23/2023   HCT 41.2 01/23/2023   MCV 81 01/23/2023   PLT 295 01/23/2023   Lab Results  Component Value Date   NA 140 05/30/2023   K 3.9  05/30/2023   CO2 23 05/30/2023   GLUCOSE 105 (H) 05/30/2023   BUN 9 05/30/2023   CREATININE 0.61 05/30/2023   BILITOT 0.2 05/30/2023   ALKPHOS 135 (H) 05/30/2023   AST 10 05/30/2023   ALT 9 05/30/2023   PROT 6.5 05/30/2023   ALBUMIN 4.0 05/30/2023   CALCIUM  9.1 05/30/2023   ANIONGAP 8 06/13/2022   EGFR 101 05/30/2023   Lab Results  Component Value Date   CHOL 162 05/30/2023   Lab Results  Component Value Date   HDL 30 (L) 05/30/2023   Lab Results  Component Value Date   LDLCALC 109 (H) 05/30/2023   Lab Results  Component Value Date   TRIG 128 05/30/2023   Lab Results  Component Value Date   CHOLHDL 5.4 (H) 05/30/2023   Lab Results  Component Value Date   HGBA1C 6.2 (H) 05/30/2023      Assessment & Plan:   Problem List Items Addressed This Visit       Cardiovascular and Mediastinum   Essential hypertension - Primary (Chronic)   BP Readings from Last 1 Encounters:  11/08/23 123/79   Usually well-controlled with Amlodipine , HCTZ and Labetalol  Counseled for compliance with the medications Advised DASH diet and moderate exercise/walking, at least 150 mins/week        Musculoskeletal and Integument   Rheumatoid arthritis (HCC)   Followed by Rheumatology On Plaquenil and Orencia  Needs to follow up with Ophthalmology      Relevant Medications   baclofen  (LIORESAL ) 10 MG tablet   Primary osteoarthritis of both shoulders   X-ray of shoulder showed OA Followed by rheumatology for rheumatoid arthritis Naproxen  as needed for pain      Relevant Medications   baclofen  (LIORESAL ) 10 MG tablet   Other Relevant Orders   Ambulatory referral to Occupational Therapy   Cervical spine ankylosis   Recent CT of the cervical spine showed cervical ankylosis, has a history of RA Continue naproxen  as needed for neck pain Referred to OT for persistent neck pain Baclofen  as needed for muscle spasms If persistent or worsening pain, will refer to spine specialist       Relevant Medications   baclofen  (LIORESAL ) 10 MG tablet   Other Relevant Orders   Ambulatory referral to Occupational Therapy     Other   Chronic fatigue   Could be due to multiple factors including RA, sleep disturbance, caregiver stress and beta-blocker Sleep study did not show OSA Checked TSH, vitamin D  Review CBC and CMP from rheumatology office Needs to get colonoscopy Could be due to insomnia- tried Elavil  for insomnia, can be helpful for chronic fatigue/fibromyalgia as well, but she had drowsiness with it      Prediabetes   Lab Results  Component Value Date   HGBA1C 6.2 (H) 05/30/2023   Advised to follow low-carb diet      Relevant Orders   Bayer DCA Hb A1c Waived   Other Visit Diagnoses       Colon cancer screening       Relevant Orders   Ambulatory referral to  Gastroenterology         Meds ordered this encounter  Medications   baclofen  (LIORESAL ) 10 MG tablet    Sig: Take 1 tablet (10 mg total) by mouth 2 (two) times daily as needed for muscle spasms.    Dispense:  30 each    Refill:  2    Follow-up: Return in about 4 months (around 03/09/2024) for HTN and neck pain.    Suzzane MARLA Blanch, MD

## 2023-11-08 NOTE — Assessment & Plan Note (Signed)
 Recent CT of the cervical spine showed cervical ankylosis, has a history of RA Continue naproxen  as needed for neck pain Referred to OT for persistent neck pain Baclofen  as needed for muscle spasms If persistent or worsening pain, will refer to spine specialist

## 2023-11-08 NOTE — Assessment & Plan Note (Signed)
 BP Readings from Last 1 Encounters:  11/08/23 123/79   Usually well-controlled with Amlodipine , HCTZ and Labetalol  Counseled for compliance with the medications Advised DASH diet and moderate exercise/walking, at least 150 mins/week

## 2023-11-08 NOTE — Assessment & Plan Note (Signed)
Followed by Rheumatology On Plaquenil and Orencia Needs to follow up with Ophthalmology 

## 2023-11-08 NOTE — Patient Instructions (Signed)
 Please take Baclofen  as needed for muscle spasms.  You are being referred to Occupational therapy for neck pain.  Please continue to take medications as prescribed.  Please continue to follow low carb diet and perform moderate exercise/walking as tolerated.

## 2023-11-08 NOTE — Addendum Note (Signed)
 Addended byBETHA TOBIE DOWNS on: 11/08/2023 09:33 AM   Modules accepted: Orders

## 2023-11-08 NOTE — Assessment & Plan Note (Signed)
 X-ray of shoulder showed OA Followed by rheumatology for rheumatoid arthritis Naproxen  as needed for pain

## 2023-11-08 NOTE — Assessment & Plan Note (Signed)
 Lab Results  Component Value Date   HGBA1C 6.2 (H) 05/30/2023   Advised to follow low-carb diet

## 2023-11-08 NOTE — Assessment & Plan Note (Signed)
 Could be due to multiple factors including RA, sleep disturbance, caregiver stress and beta-blocker Sleep study did not show OSA Checked TSH, vitamin D  Review CBC and CMP from rheumatology office Needs to get colonoscopy Could be due to insomnia- tried Elavil  for insomnia, can be helpful for chronic fatigue/fibromyalgia as well, but she had drowsiness with it

## 2023-11-09 LAB — BAYER DCA HB A1C WAIVED: HB A1C (BAYER DCA - WAIVED): 6.1 % — ABNORMAL HIGH (ref 4.8–5.6)

## 2023-12-01 ENCOUNTER — Encounter: Payer: Self-pay | Admitting: Family Medicine

## 2023-12-01 ENCOUNTER — Ambulatory Visit: Admitting: Family Medicine

## 2023-12-01 ENCOUNTER — Ambulatory Visit: Payer: Self-pay

## 2023-12-01 VITALS — BP 164/103 | HR 86 | Resp 16 | Ht 65.0 in | Wt 204.0 lb

## 2023-12-01 DIAGNOSIS — M19012 Primary osteoarthritis, left shoulder: Secondary | ICD-10-CM | POA: Diagnosis not present

## 2023-12-01 DIAGNOSIS — M19011 Primary osteoarthritis, right shoulder: Secondary | ICD-10-CM | POA: Diagnosis not present

## 2023-12-01 MED ORDER — MELOXICAM 15 MG PO TABS: 15.0000 mg | ORAL_TABLET | Freq: Every day | ORAL | 0 refills | Status: DC

## 2023-12-01 NOTE — Patient Instructions (Addendum)
 I appreciate the opportunity to provide care to you today!  Osteoarthritis of both shoulders -Refill: Meloxicam  15 mg PO daily as needed for pain -Referral placed to Orthopedic Surgery for further evaluation and management  Nonpharmacological interventions recommended: -Application of heat or cold packs for pain relief -Gentle range-of-motion and stretching exercises to maintain mobility -Physical therapy to improve strength and function -Weight management to reduce stress on joints -Use of supportive devices as needed (e.g., shoulder brace, posture support) -Activity modification to avoid repetitive strain or heavy lifting   Referrals today-  orthodepic surgery   Attached with your AVS, you will find valuable resources for self-education. I highly recommend dedicating some time to thoroughly examine them.   Please continue to a heart-healthy diet and increase your physical activities. Try to exercise for at least five days a week.    It was a pleasure to see you and I look forward to continuing to work together on your health and well-being. Please do not hesitate to call the office if you need care or have questions about your care.  In case of emergency, please visit the Emergency Department for urgent care, or contact our clinic at [phone] to schedule an appointment. We're here to help you!   Have a wonderful day and week. With Gratitude, Hasaan Radde MSN, FNP-BC

## 2023-12-01 NOTE — Telephone Encounter (Signed)
 FYI Only or Action Required?: Action required by provider: request for appointment.  Patient was last seen in primary care on 11/08/2023 by Tobie Suzzane POUR, MD.  Called Nurse Triage reporting Shoulder Pain.  Symptoms began several weeks ago.  Interventions attempted: Prescription medications:  SABRA  Symptoms are: gradually worsening. Prescribed medicines are not helping he arthritis pain.  Triage Disposition: See PCP When Office is Open (Within 3 Days)  Patient/caregiver understands and will follow disposition?: Yes   Copied from CRM #[id number]. Topic: Clinical - Red Word Triage >> Dec 01, 2023  8:49 AM Alejandra Sanders wrote: Red Word that prompted transfer to Nurse Triage: Severe arthritic pain in shoulders. Medicine from PCP not working. Reason for Disposition  [1] MODERATE pain (e.g., interferes with normal activities) AND [2] present > 3 days  Answer Assessment - Initial Assessment Questions 1. ONSET: When did the pain start?     2 weeks 2. LOCATION: Where is the pain located?     Both shoulders 3. PAIN: How bad is the pain? (Scale 1-10; or mild, moderate, severe)     9-10 4. WORK OR EXERCISE: Has there been any recent work or exercise that involved this part of the body?     no 5. CAUSE: What do you think is causing the shoulder pain?     arthritis 6. OTHER SYMPTOMS: Do you have any other symptoms? (e.g., neck pain, swelling, rash, fever, numbness, weakness)     no 7. PREGNANCY: Is there any chance you are pregnant? When was your last menstrual period?     no  Protocols used: Shoulder Pain-A-AH

## 2023-12-01 NOTE — Progress Notes (Signed)
 Acute Office Visit  Subjective:    Patient ID: Alejandra Sanders, female    DOB: 1960/11/17, 63 y.o.   MRN: [id number]  Chief Complaint  Patient presents with   Shoulder Pain    Bilateral shoulder pain x more than a month. Has been taking tylenol  arthritis. Unable to lift her arms up. Getting worse    HPI The patient presents with bilateral shoulder pain ongoing for more than one month. She reports taking Tylenol  Arthritis with minimal relief. Pain is described as throbbing and aching, sometimes severe enough to prevent sleep. She rates her pain as 8/10 and notes that the right shoulder is worse than the left. The pain is aggravated at night and when lying on her back. She reports stiffness and difficulty lifting her arms, with significant limitation in range of motion.  She has been taking Tylenol  Arthritis 800 mg daily (two tablets) without adequate improvement. She also reports that her symptoms worsened after completing a course of prednisone . She is scheduled to begin physical therapy on September 15.    Past Medical History:  Diagnosis Date   Arthritis    Calculus of gallbladder with chronic cholecystitis without obstruction    Chronic cholecystitis 06/11/2018   Hypertension    Seizure (HCC)    had seizurea as child; unknow etiology; was on phenobarbital for a few years and then was taken off meds. No seizure in 40 years.   Thyroid  disease    Trichimoniasis 02/11/2021   Treated with flagyl  02/11/21    Past Surgical History:  Procedure Laterality Date   CHOLECYSTECTOMY N/A 06/11/2018   Procedure: LAPAROSCOPIC CHOLECYSTECTOMY;  Surgeon: Kallie Manuelita BROCKS, MD;  Location: [facility];  Service: General;  Laterality: N/A;   GALLBLADDER SURGERY  06/11/2018   THYROID  SURGERY     removal of thyroid     Family History  Problem Relation Age of Onset   Aneurysm Maternal Grandmother    Hypertension Father    Hypertension Mother    Diabetes Mother    Heart attack Brother     Hypertension Sister    Kidney failure Sister    Hypertension Sister    Hypertension Sister    Hypertension Son     Social History   Socioeconomic History   Marital status: Single    Spouse name: Not on file   Number of children: Not on file   Years of education: Not on file   Highest education level: Not on file  Occupational History   Not on file  Tobacco Use   Smoking status: Former    Current packs/day: 0.00    Average packs/day: 0.3 packs/day for 35.0 years (8.8 ttl pk-yrs)    Types: Cigarettes    Quit date: 08/18/2023    Years since quitting: 0.2   Smokeless tobacco: Never   Tobacco comments:    states smokes 3 cig daily only  Substance and Sexual Activity   Alcohol use: No   Drug use: No   Sexual activity: Not Currently    Birth control/protection: Post-menopausal  Other Topics Concern   Not on file  Social History Narrative   Not on file   Social Drivers of Health   Financial Resource Strain: Low Risk  (02/08/2021)   Overall Financial Resource Strain (CARDIA)    Difficulty of Paying Living Expenses: Not hard at all  Food Insecurity: No Food Insecurity (02/08/2021)   Hunger Vital Sign    Worried About Running Out of Food in the Last Year: Never true  Ran Out of Food in the Last Year: Never true  Transportation Needs: No Transportation Needs (02/08/2021)   PRAPARE - Administrator, Civil Service (Medical): No    Lack of Transportation (Non-Medical): No  Physical Activity: Insufficiently Active (02/08/2021)   Exercise Vital Sign    Days of Exercise per Week: 2 days    Minutes of Exercise per Session: 20 min  Stress: No Stress Concern Present (02/08/2021)   [employer] of Occupational Health - Occupational Stress Questionnaire    Feeling of Stress : Only a little  Social Connections: Moderately Integrated (02/08/2021)   Social Connection and Isolation Panel    Frequency of Communication with Friends and Family: More than three times a week     Frequency of Social Gatherings with Friends and Family: More than three times a week    Attends Religious Services: More than 4 times per year    Active Member of [employer] or Organizations: Yes    Attends Banker Meetings: More than 4 times per year    Marital Status: Never married  Intimate Partner Violence: Not At Risk (02/08/2021)   Humiliation, Afraid, Rape, and Kick questionnaire    Fear of Current or Ex-Partner: No    Emotionally Abused: No    Physically Abused: No    Sexually Abused: No    Outpatient Medications Prior to Visit  Medication Sig Dispense Refill   albuterol  (VENTOLIN  HFA) 108 (90 Base) MCG/ACT inhaler Inhale 1-2 puffs into the lungs every 6 (six) hours as needed for wheezing or shortness of breath. 18 g 1   amLODipine  (NORVASC ) 10 MG tablet Take 1 tablet (10 mg total) by mouth daily. 90 tablet 1   baclofen  (LIORESAL ) 10 MG tablet Take 1 tablet (10 mg total) by mouth 2 (two) times daily as needed for muscle spasms. 30 each 2   diclofenac Sodium (VOLTAREN) 1 % GEL See admin instructions.     hydrochlorothiazide  (MICROZIDE ) 12.5 MG capsule Take 1 capsule (12.5 mg total) by mouth daily. 90 capsule 1   HYDROcodone -acetaminophen  (NORCO/VICODIN) 5-325 MG tablet Take 1 tablet by mouth every 6 (six) hours as needed for severe pain (pain score 7-10). 10 tablet 0   hydroxychloroquine (PLAQUENIL) 200 MG tablet Take 2 tablets by mouth daily.     labetalol  (NORMODYNE ) 200 MG tablet Take 1 tablet (200 mg total) by mouth 2 (two) times daily. 180 tablet 1   metoprolol  tartrate (LOPRESSOR ) 100 MG tablet Take 1 tablet by mouth two hours prior to CT scan. 1 tablet 0   mometasone  (NASONEX ) 50 MCG/ACT nasal spray Place 1 spray into the nose at bedtime. 1 each 0   ORENCIA  CLICKJECT 125 MG/ML SOAJ Inject 1 pen into the skin once a week. Takes on Tuesdays     rosuvastatin  (CRESTOR ) 5 MG tablet Take 1 tablet (5 mg total) by mouth daily. 90 tablet 1   Vitamin D , Ergocalciferol , (DRISDOL )  1.25 MG (50000 UNIT) CAPS capsule Take 1 capsule (50,000 Units total) by mouth every 7 (seven) days. 12 capsule 1   naproxen  (NAPROSYN ) 500 MG tablet Take 1 tablet (500 mg total) by mouth 2 (two) times daily. 30 tablet 1   No facility-administered medications prior to visit.    Allergies  Allergen Reactions   Lisinopril Swelling    Review of Systems  Constitutional:  Negative for chills and fever.  Eyes:  Negative for visual disturbance.  Respiratory:  Negative for chest tightness and shortness of breath.  Musculoskeletal:        Bil shoulder pain  Neurological:  Negative for dizziness and headaches.       Objective:    Physical Exam HENT:     Head: Normocephalic.     Mouth/Throat:     Mouth: Mucous membranes are moist.  Cardiovascular:     Rate and Rhythm: Normal rate.     Heart sounds: Normal heart sounds.  Pulmonary:     Effort: Pulmonary effort is normal.     Breath sounds: Normal breath sounds.  Musculoskeletal:     Right shoulder: Tenderness present. Decreased range of motion.     Left shoulder: Decreased range of motion.  Neurological:     Mental Status: She is alert.     BP (!) 164/103   Pulse 86   Resp 16   Ht 5' 5 (1.651 m)   Wt 204 lb (92.5 kg)   SpO2 96%   BMI 33.95 kg/m  Wt Readings from Last 3 Encounters:  12/01/23 204 lb (92.5 kg)  11/08/23 205 lb (93 kg)  10/16/23 204 lb 9.6 oz (92.8 kg)       Assessment & Plan:  Primary osteoarthritis of both shoulders Assessment & Plan: Refill: Meloxicam  15 mg PO daily as needed for pain -Referral placed to Orthopedic Surgery for further evaluation and management  Nonpharmacological interventions recommended: -Application of heat or cold packs for pain relief -Gentle range-of-motion and stretching exercises to maintain mobility -Physical therapy to improve strength and function -Weight management to reduce stress on joints -Use of supportive devices as needed (e.g., shoulder brace, posture  support) -Activity modification to avoid repetitive strain or heavy lifting   Orders: -     Ambulatory referral to Orthopedic Surgery -     Meloxicam ; Take 1 tablet (15 mg total) by mouth daily.  Dispense: 60 tablet; Refill: 0   Note: This chart has been completed using Engineer, civil (consulting) software, and while attempts have been made to ensure accuracy, certain words and phrases may not be transcribed as intended.   Varonica Siharath, FNP

## 2023-12-01 NOTE — Telephone Encounter (Signed)
 Appt made.

## 2023-12-02 NOTE — Assessment & Plan Note (Signed)
 Refill: Meloxicam  15 mg PO daily as needed for pain -Referral placed to Orthopedic Surgery for further evaluation and management  Nonpharmacological interventions recommended: -Application of heat or cold packs for pain relief -Gentle range-of-motion and stretching exercises to maintain mobility -Physical therapy to improve strength and function -Weight management to reduce stress on joints -Use of supportive devices as needed (e.g., shoulder brace, posture support) -Activity modification to avoid repetitive strain or heavy lifting

## 2023-12-15 NOTE — Therapy (Signed)
 OUTPATIENT PHYSICAL THERAPY CERVICAL EVALUATION   Patient Name: Alejandra Sanders MRN: [id number] DOB:1960/08/24, 63 y.o., female Today's Date: 12/18/2023  END OF SESSION:  PT End of Session - 12/18/23 1016     Visit Number 1    Number of Visits 8    Date for PT Re-Evaluation 01/15/24    Authorization Type [place] Medicaid [employer]    Authorization Time Period please check auth    PT Start Time 1017    PT Stop Time 1057    PT Time Calculation (min) 40 min    Activity Tolerance Patient tolerated treatment well    Behavior During Therapy WFL for tasks assessed/performed          Past Medical History:  Diagnosis Date   Arthritis    Calculus of gallbladder with chronic cholecystitis without obstruction    Chronic cholecystitis 06/11/2018   Hypertension    Seizure (HCC)    had seizurea as child; unknow etiology; was on phenobarbital for a few years and then was taken off meds. No seizure in 40 years.   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   Primary osteoarthritis of both shoulders 10/20/2023   Acute pain of both knees 10/20/2023   DISH (diffuse idiopathic skeletal hyperostosis) 10/20/2023   Wheezing 05/30/2023   Prediabetes 05/30/2023   DOE (dyspnea on exertion) 05/03/2023   Vitamin D  deficiency 01/30/2023   Primary insomnia 09/30/2022   Mixed hyperlipidemia 09/30/2022   Caregiver with fatigue 07/14/2022   Cardiomegaly 07/14/2022   Calcification of coronary artery 07/14/2022   OSA (obstructive sleep apnea) 05/05/2022   Allergic sinusitis 05/05/2022    Encounter for general adult medical examination with abnormal findings 01/27/2022   Chronic fatigue 01/27/2022   Primary localized osteoarthrosis of multiple sites 07/27/2021   DDD (degenerative disc disease), lumbar 02/24/2021   Encounter for examination following treatment at hospital 02/24/2021   Obesity 01/26/2021   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   Neuropathy 01/26/2021   Other long term (current) drug therapy 01/26/2021   Right anterior shoulder pain 01/26/2021   Essential hypertension 12/15/2020   Rheumatoid arthritis (HCC) 12/15/2020   Tobacco abuse 12/15/2020    PCP: Tobie Downs MD  REFERRING PROVIDER: Tobie Downs MD REFERRING DIAG:  M19.011,M19.012 (ICD-10-CM) - Primary osteoarthritis of both shoulders  M43.22 (ICD-10-CM) - Cervical spine ankylosis    THERAPY DIAG:  Neck and shoulder pain  Weakness of right shoulder  Weakness of left shoulder  Decreased range of motion of neck  Rationale for Evaluation and Treatment: Rehabilitation  ONSET DATE: 4 months  SUBJECTIVE:  SUBJECTIVE STATEMENT: Insidious onset about 4 months ago; couldn't lift my arms; went to hospital and told her was inflammation.  Initially was right side and now left side and now going down her arms; right side is worse than left.  Saw Patel who gave her medication and then had to go back because of continued pain.  Made a referral to orthopedic MD; sees Dr. Onesimo tomorrow and to therapy.  Reports constant pain.   Hand dominance: Right  PERTINENT HISTORY:  RA DDD OA DISH in neck Left leg is a little swollen  PAIN:  Are you having pain? Yes: NPRS scale: 9.5/10; best 5/10 Pain location: neck, shoulders and down both arms and right worse than left Pain description: aching Aggravating factors:  laying down straight Relieving factors: pain medication, heat patches  PRECAUTIONS: None   WEIGHT BEARING RESTRICTIONS: No  FALLS:  Has patient fallen in last 6 months? No    OCCUPATION: not working  PLOF: Independent with basic ADLs  PATIENT GOALS: less pain  NEXT MD VISIT: tomorrow sees Dr. Onesimo  OBJECTIVE:  Note: Objective measures were completed at Evaluation unless otherwise noted.  DIAGNOSTIC FINDINGS:  MPRESSION: 1. No acute osseous abnormality in the cervical spine. 2. Diffuse idiopathic skeletal hyperostosis (DISH). No cervical spinal stenosis suspected by CT. 3. 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  MPRESSION: No acute osseous abnormality identified.   Mild to moderate left glenohumeral and AC joint degeneration.   IMPRESSION: No acute osseous abnormality identified.   Mild to moderate chronic glenohumeral and AC joint degeneration.  PATIENT SURVEYS:  NDI:  NECK DISABILITY INDEX  Date: 12/18/23 Score                                Total 26/50; 52%   Minimum Detectable Change (90% confidence): 5 points or 10% points  COGNITION: Overall cognitive status: Within functional limits for tasks assessed  SENSATION: WFL  POSTURE: rounded shoulders and forward head  PALPATION: Very tight mid upper traps; soreness lower cervical spine and upper thoracic spine paraspinals   CERVICAL ROM:   Active ROM AROM (deg) eval  Flexion 34  Extension 14*  Right lateral flexion 25  Left lateral flexion 22  Right rotation 43  Left rotation 54   (Blank rows = not tested)  UPPER EXTREMITY ROM:  Active ROM Right eval Left eval  Shoulder flexion 32 40  Shoulder extension    Shoulder abduction    Shoulder adduction    Shoulder extension    Shoulder internal rotation    Shoulder external rotation    Elbow flexion    Elbow extension    Wrist flexion    Wrist extension    Wrist ulnar  deviation    Wrist radial deviation    Wrist pronation    Wrist supination     (Blank rows = not tested)  UPPER EXTREMITY MMT:  MMT Right eval Left eval  Shoulder flexion (sitting) 2- 2-  Shoulder extension    Shoulder abduction    Shoulder adduction    Shoulder extension    Shoulder internal rotation    Shoulder external rotation    Middle trapezius    Lower trapezius    Elbow flexion 4+ 4+  Elbow extension 4+ 4+  Wrist flexion    Wrist extension    Wrist ulnar deviation    Wrist radial deviation    Wrist  pronation    Wrist supination    Grip strength     (Blank rows = not tested)  FUNCTIONAL TESTS:    TREATMENT DATE: 12/18/23 physical therapy evaluation and HEP instruction                                                                                                                                 PATIENT EDUCATION:  Education details: Patient educated on exam findings, POC, scope of PT, HEP, and what to expect next visit. Person educated: Patient Education method: Explanation, Demonstration, and Handouts Education comprehension: verbalized understanding, returned demonstration, verbal cues required, and tactile cues required  HOME EXERCISE PROGRAM: Access Code: [email or web address] URL: [email or web address] Date: 12/18/2023 Prepared by: [facility]  Exercises - Seated Cervical Retraction  - 4 x daily - 7 x weekly - 1 sets - 10 reps - Seated Cervical Rotation AROM  - 2 x daily - 7 x weekly - 1 sets - 10 reps - Seated Gentle Upper Trapezius Stretch  - 2 x daily - 7 x weekly - 1 sets - 10 reps - 20 sec hold - Seated Shoulder Flexion Towel Slide at Table Top  - 2 x daily - 7 x weekly - 1 sets - 10 reps - 5 sec hold - Shoulder Flexion Wall Slide with Towel  - 2 x daily - 7 x weekly - 1 sets - 10 reps  ASSESSMENT:  CLINICAL IMPRESSION: Patient is a 63 y.o. female who was seen today for physical therapy evaluation and treatment for neck and shoulder pain.  Patient demonstrates decreased strength, ROM restriction, reduced flexibility, increased tenderness to palpation and postural abnormalities which are likely contributing to symptoms of pain and are negatively impacting patient ability to perform ADLs. Patient will benefit from skilled physical therapy services to address these deficits to reduce pain and improve level of function with ADLs  OBJECTIVE IMPAIRMENTS: decreased activity tolerance, decreased ROM, decreased strength, increased fascial restrictions, impaired perceived functional ability, impaired UE functional use, and pain.   ACTIVITY LIMITATIONS: carrying, lifting, bending, sitting, and reach over head  PARTICIPATION LIMITATIONS: meal prep, cleaning, laundry, driving, shopping, and community activity  PERSONAL FACTORS: 1 comorbidity: RA are also affecting patient's functional outcome.   REHAB POTENTIAL: Good  CLINICAL DECISION MAKING: Evolving/moderate complexity  EVALUATION COMPLEXITY: Moderate   GOALS: Goals reviewed with patient? No  SHORT TERM GOALS: Target date: 01/01/2024  patient will be independent with initial HEP  Baseline:  Goal status: INITIAL  2.  Patient will report 50% improvement overall  Baseline:  Goal status: INITIAL   LONG TERM GOALS: Target date: 01/15/2024  Patient will be independent in self management strategies to improve quality of life and functional outcomes.  Baseline:  Goal status: INITIAL  2.  Patient will report 75% improvement overall  Baseline:  Goal status: INITIAL  3.  Patient will increase cervical mobility by 20 degrees throughout to improve ability to scan  for safety with driving Baseline: see above Goal status: INITIAL  4.  Patient will be able to elevate her shoulders in sitting to 90 degrees to improve ability to fix her hair and reach into cabinets Baseline: see above Goal status: INITIAL  5.  Patient will improve NDI score by 6 points (20 or less) to  demonstrate improved perceived function  Baseline: 26/50 Goal status: INITIAL  PLAN:  PT FREQUENCY: 2x/week  PT DURATION: 4 weeks  PLANNED INTERVENTIONS: 97164- PT Re-evaluation, 97110-Therapeutic exercises, 97530- Therapeutic activity, 97112- Neuromuscular re-education, 97535- Self Care, 02859- Manual therapy, [id number]- Gait training, [phone]- Orthotic Fit/training, [phone]- Canalith repositioning, [id number]- Aquatic Therapy, 97760- Splinting, [phone]- Wound care (first 20 sq cm), 97598- Wound care (each additional 20 sq cm)Patient/Family education, Balance training, Stair training, Taping, Dry Needling, Joint mobilization, Joint manipulation, Spinal manipulation, Spinal mobilization, Scar mobilization, and DME instructions.   PLAN FOR NEXT SESSION: Review HEP and goals; does not tolerate supine very well; cervical mobility, UE mobility; postural strengthening   12:21 PM, 2024/01/16 Josia Cueva Small Brandon Wiechman MPT [facility] [place] [phone] Ph:[phone]   Managed Medicaid Authorization Request Treatment Start Date: January 16, 2024  Visit Dx Codes: M54.2, M25.519, R29.898  Functional Tool Score: NDI 26/50  For all possible CPT codes, reference the Planned Interventions line above.     Check all conditions that are expected to impact treatment: {Conditions expected to impact treatment:None of these apply   If treatment provided at initial evaluation, no treatment charged due to lack of authorization.

## 2023-12-18 ENCOUNTER — Other Ambulatory Visit: Payer: Self-pay

## 2023-12-18 ENCOUNTER — Ambulatory Visit (HOSPITAL_COMMUNITY): Attending: Internal Medicine

## 2023-12-18 DIAGNOSIS — M25519 Pain in unspecified shoulder: Secondary | ICD-10-CM | POA: Insufficient documentation

## 2023-12-18 DIAGNOSIS — M542 Cervicalgia: Secondary | ICD-10-CM | POA: Insufficient documentation

## 2023-12-18 DIAGNOSIS — M19012 Primary osteoarthritis, left shoulder: Secondary | ICD-10-CM | POA: Insufficient documentation

## 2023-12-18 DIAGNOSIS — R29898 Other symptoms and signs involving the musculoskeletal system: Secondary | ICD-10-CM | POA: Diagnosis present

## 2023-12-18 DIAGNOSIS — M19011 Primary osteoarthritis, right shoulder: Secondary | ICD-10-CM | POA: Diagnosis not present

## 2023-12-18 DIAGNOSIS — M4322 Fusion of spine, cervical region: Secondary | ICD-10-CM | POA: Diagnosis not present

## 2023-12-19 ENCOUNTER — Ambulatory Visit (INDEPENDENT_AMBULATORY_CARE_PROVIDER_SITE_OTHER): Admitting: Orthopedic Surgery

## 2023-12-19 VITALS — BP 150/91 | HR 90 | Ht 65.0 in | Wt 206.0 lb

## 2023-12-19 DIAGNOSIS — G8929 Other chronic pain: Secondary | ICD-10-CM | POA: Diagnosis not present

## 2023-12-19 DIAGNOSIS — M25512 Pain in left shoulder: Secondary | ICD-10-CM

## 2023-12-19 DIAGNOSIS — M25511 Pain in right shoulder: Secondary | ICD-10-CM

## 2023-12-19 NOTE — Patient Instructions (Signed)

## 2023-12-19 NOTE — Progress Notes (Unsigned)
 New Patient Visit  Assessment: Alejandra Sanders is a 63 y.o. female with the following: There are no diagnoses linked to this encounter.  Plan: [facility] Height   Follow-up: No follow-ups on file.  Subjective:  Chief Complaint  Patient presents with   Shoulder Pain    Bilat R > L for 3 mos getting worse.     History of Present Illness: Alejandra Sanders is a 63 y.o. female who {Presentation:27320} for evaluation of    Review of Systems: No fevers or chills*** No numbness or tingling No chest pain No shortness of breath No bowel or bladder dysfunction No GI distress No headaches   Medical History:  Past Medical History:  Diagnosis Date   Arthritis    Calculus of gallbladder with chronic cholecystitis without obstruction    Chronic cholecystitis 06/11/2018   Hypertension    Seizure (HCC)    had seizurea as child; unknow etiology; was on phenobarbital for a few years and then was taken off meds. No seizure in 40 years.   Thyroid  disease    Trichimoniasis 02/11/2021   Treated with flagyl  02/11/21    Past Surgical History:  Procedure Laterality Date   CHOLECYSTECTOMY N/A 06/11/2018   Procedure: LAPAROSCOPIC CHOLECYSTECTOMY;  Surgeon: Kallie Manuelita BROCKS, MD;  Location: [facility];  Service: General;  Laterality: N/A;   GALLBLADDER SURGERY  06/11/2018   THYROID  SURGERY     removal of thyroid     Family History  Problem Relation Age of Onset   Aneurysm Maternal Grandmother    Hypertension Father    Hypertension Mother    Diabetes Mother    Heart attack Brother    Hypertension Sister    Kidney failure Sister    Hypertension Sister    Hypertension Sister    Hypertension Son    Social History   Tobacco Use   Smoking status: Former    Current packs/day: 0.00    Average packs/day: 0.3 packs/day for 35.0 years (8.8 ttl pk-yrs)    Types: Cigarettes    Quit date: 08/18/2023    Years since quitting: 0.3   Smokeless tobacco: Never   Tobacco comments:    states smokes  3 cig daily only  Substance Use Topics   Alcohol use: No   Drug use: No    Allergies  Allergen Reactions   Lisinopril Swelling    No outpatient medications have been marked as taking for the 12/19/23 encounter (Office Visit) with Onesimo Oneil LABOR, MD.    Objective: BP (!) 150/91   Pulse 90   Ht 5' 5 (1.651 m)   Wt 206 lb (93.4 kg)   BMI 34.28 kg/m   Physical Exam:  General: {General PE Findings:25791} Gait: {Gait:25792}    IMAGING: {XR Reviewed:24899}   New Medications:  No orders of the defined types were placed in this encounter.     Oneil LABOR Onesimo, MD  12/19/2023 11:42 AM

## 2023-12-20 ENCOUNTER — Ambulatory Visit (HOSPITAL_COMMUNITY)

## 2023-12-20 ENCOUNTER — Encounter: Payer: Self-pay | Admitting: Orthopedic Surgery

## 2023-12-20 DIAGNOSIS — M542 Cervicalgia: Secondary | ICD-10-CM | POA: Diagnosis not present

## 2023-12-20 DIAGNOSIS — M25519 Pain in unspecified shoulder: Secondary | ICD-10-CM

## 2023-12-20 DIAGNOSIS — R29898 Other symptoms and signs involving the musculoskeletal system: Secondary | ICD-10-CM

## 2023-12-20 NOTE — Therapy (Signed)
 OUTPATIENT PHYSICAL THERAPY CERVICAL TREATMENT   Patient Name: Alejandra Sanders MRN: [id number] DOB:[birth year], 63 y.o., female Today's Date: 12/20/2023  END OF SESSION:  PT End of Session - 12/20/23 1059     Visit Number 2    Number of Visits 8    Date for PT Re-Evaluation 01/15/24    Authorization Type [place] Medicaid [employer]    Authorization Time Period 6 visits from 12/18/23 to 02/15/24    Authorization - Visit Number 1    Authorization - Number of Visits 6    Progress Note Due on Visit 6    PT Start Time 1100    PT Stop Time 1140    PT Time Calculation (min) 40 min    Activity Tolerance Patient tolerated treatment well    Behavior During Therapy [facility] for tasks assessed/performed          Past Medical History:  Diagnosis Date   Arthritis    Calculus of gallbladder with chronic cholecystitis without obstruction    Chronic cholecystitis 06/11/2018   Hypertension    Seizure (HCC)    had seizurea as child; unknow etiology; was on phenobarbital for a few years and then was taken off meds. No seizure in 40 years.   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   Primary osteoarthritis of both shoulders 10/20/2023   Acute pain of both knees 10/20/2023   DISH (diffuse idiopathic skeletal hyperostosis) 10/20/2023   Wheezing 05/30/2023   Prediabetes 05/30/2023   DOE (dyspnea on exertion) 05/03/2023   Vitamin D  deficiency 01/30/2023   Primary insomnia 09/30/2022   Mixed hyperlipidemia 09/30/2022   Caregiver with fatigue 07/14/2022   Cardiomegaly 07/14/2022    Calcification of coronary artery 07/14/2022   OSA (obstructive sleep apnea) 05/05/2022   Allergic sinusitis 05/05/2022   Encounter for general adult medical examination with abnormal findings 01/27/2022   Chronic fatigue 01/27/2022   Primary localized osteoarthrosis of multiple sites 07/27/2021   DDD (degenerative disc disease), lumbar 02/24/2021   Encounter for examination following treatment at hospital 02/24/2021   Obesity 01/26/2021   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   Neuropathy 01/26/2021   Other long term (current) drug therapy 01/26/2021   Right anterior shoulder pain 01/26/2021   Essential hypertension 12/15/2020   Rheumatoid arthritis (HCC) 12/15/2020   Tobacco abuse 12/15/2020    PCP: Tobie Downs MD  REFERRING PROVIDER: Tobie Downs MD REFERRING DIAG:  M19.011,M19.012 (ICD-10-CM) - Primary osteoarthritis of both shoulders  M43.22 (ICD-10-CM) - Cervical spine ankylosis    THERAPY DIAG:  Neck and shoulder pain  Weakness of right shoulder  Weakness of left shoulder  Decreased range of motion of neck  Rationale for Evaluation and Treatment: Rehabilitation  ONSET DATE: 4 months  SUBJECTIVE:  SUBJECTIVE STATEMENT: Got an injection of cortisone with lidocaine  in each shoulder yesterday per Dr. Onesimo and states that her shoulders are moving much better and she slept well last night.  2 to 3/10 pain today.    Eval: Insidious onset about 4 months ago; couldn't lift my arms; went to hospital and told her was inflammation.  Initially was right side and now left side and now going down her arms; right side is worse than left.  Saw Patel who gave her medication and then had to go back because of continued pain.  Made a referral to orthopedic MD; sees Dr. Onesimo tomorrow and to  therapy.  Reports constant pain.   Hand dominance: Right  PERTINENT HISTORY:  RA DDD OA DISH in neck Left leg is a little swollen  PAIN:  Are you having pain? Yes: NPRS scale: 9.5/10; best 5/10 Pain location: neck, shoulders and down both arms and right worse than left Pain description: aching Aggravating factors: laying down straight Relieving factors: pain medication, heat patches  PRECAUTIONS: None   WEIGHT BEARING RESTRICTIONS: No  FALLS:  Has patient fallen in last 6 months? No    OCCUPATION: not working  PLOF: Independent with basic ADLs  PATIENT GOALS: less pain  NEXT MD VISIT: tomorrow sees Dr. Onesimo  OBJECTIVE:  Note: Objective measures were completed at Evaluation unless otherwise noted.  DIAGNOSTIC FINDINGS:  MPRESSION: 1. No acute osseous abnormality in the cervical spine. 2. Diffuse idiopathic skeletal hyperostosis (DISH). No cervical spinal stenosis suspected by CT. 3. 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  MPRESSION: No acute osseous abnormality identified.   Mild to moderate left glenohumeral and AC joint degeneration.   IMPRESSION: No acute osseous abnormality identified.   Mild to moderate chronic glenohumeral and AC joint degeneration.  PATIENT SURVEYS:  NDI:  NECK DISABILITY INDEX  Date: 12/18/23 Score                                Total 26/50; 52%   Minimum Detectable Change (90% confidence): 5 points or 10% points  COGNITION: Overall cognitive status: Within functional limits for tasks assessed  SENSATION: WFL  POSTURE: rounded shoulders and forward head  PALPATION: Very tight mid upper traps; soreness lower cervical spine and upper thoracic spine paraspinals   CERVICAL ROM:   Active ROM AROM (deg) eval  Flexion 34  Extension 14*  Right lateral flexion 25  Left lateral flexion 22  Right rotation 43  Left rotation 54   (Blank rows = not  tested)  UPPER EXTREMITY ROM:  Active ROM Right eval Left eval  Shoulder flexion 32 40  Shoulder extension    Shoulder abduction    Shoulder adduction    Shoulder extension    Shoulder internal rotation    Shoulder external rotation    Elbow flexion    Elbow extension    Wrist flexion    Wrist extension    Wrist ulnar deviation    Wrist radial deviation    Wrist pronation    Wrist supination     (Blank rows = not tested)  UPPER EXTREMITY MMT:  MMT Right eval Left eval  Shoulder flexion (sitting) 2- 2-  Shoulder extension    Shoulder abduction    Shoulder adduction    Shoulder extension    Shoulder internal rotation    Shoulder external rotation    Middle trapezius  Lower trapezius    Elbow flexion 4+ 4+  Elbow extension 4+ 4+  Wrist flexion    Wrist extension    Wrist ulnar deviation    Wrist radial deviation    Wrist pronation    Wrist supination    Grip strength     (Blank rows = not tested)  FUNCTIONAL TESTS:    TREATMENT DATE:  12/20/23 Sitting with moist heat while reviewing goals x 5' Sitting Upper trap stretch 5 x 20 each Cervical rotation 10 hold x 5 each STM to bilateral upper traps and cervical spine x 15' to decrease tissue tightness and improve cervical mobility    12/18/23 physical therapy evaluation and HEP instruction                                                                                                                                 PATIENT EDUCATION:  Education details: Patient educated on exam findings, POC, scope of PT, HEP, and what to expect next visit. Person educated: Patient Education method: Explanation, Demonstration, and Handouts Education comprehension: verbalized understanding, returned demonstration, verbal cues required, and tactile cues required  HOME EXERCISE PROGRAM: Access Code: [email or web address] URL: [email or web address] Date: 12/18/2023 Prepared by: [facility] - [facility]  Exercises - Seated  Cervical Retraction  - 4 x daily - 7 x weekly - 1 sets - 10 reps - Seated Cervical Rotation AROM  - 2 x daily - 7 x weekly - 1 sets - 10 reps - Seated Gentle Upper Trapezius Stretch  - 2 x daily - 7 x weekly - 1 sets - 10 reps - 20 sec hold - Seated Shoulder Flexion Towel Slide at Table Top  - 2 x daily - 7 x weekly - 1 sets - 10 reps - 5 sec hold - Shoulder Flexion Wall Slide with Towel  - 2 x daily - 7 x weekly - 1 sets - 10 reps  ASSESSMENT:  CLINICAL IMPRESSION: Today's session started with a review of HEP and goals.  Sat with moist heat on cervical spine while reviewing goals.  Held off upper extremity exercise due to recent injection.  She is able to visibly raise her arms higher today; shoulders elevate over 90 degrees.    Continued with cervical mobility exercises and STM to upper traps and cervical spine to improve mobility.  Patient will benefit from continued skilled therapy services to address deficits and promote return to optimal function.      Eval: Patient is a 63 y.o. female who was seen today for physical therapy evaluation and treatment for neck and shoulder pain. Patient demonstrates decreased strength, ROM restriction, reduced flexibility, increased tenderness to palpation and postural abnormalities which are likely contributing to symptoms of pain and are negatively impacting patient ability to perform ADLs. Patient will benefit from skilled physical therapy services to address these deficits to reduce pain and improve level of function with ADLs  OBJECTIVE IMPAIRMENTS: decreased activity tolerance,  decreased ROM, decreased strength, increased fascial restrictions, impaired perceived functional ability, impaired UE functional use, and pain.   ACTIVITY LIMITATIONS: carrying, lifting, bending, sitting, and reach over head  PARTICIPATION LIMITATIONS: meal prep, cleaning, laundry, driving, shopping, and community activity  PERSONAL FACTORS: 1 comorbidity: RA are also affecting  patient's functional outcome.   REHAB POTENTIAL: Good  CLINICAL DECISION MAKING: Evolving/moderate complexity  EVALUATION COMPLEXITY: Moderate   GOALS: Goals reviewed with patient? No  SHORT TERM GOALS: Target date: 01/01/2024  patient will be independent with initial HEP  Baseline:  Goal status: in progress   2.  Patient will report 50% improvement overall  Baseline:  Goal status: in progress    LONG TERM GOALS: Target date: 01/15/2024  Patient will be independent in self management strategies to improve quality of life and functional outcomes.  Baseline:  Goal status: in progress  2.  Patient will report 75% improvement overall  Baseline:  Goal status: in progress  3.  Patient will increase cervical mobility by 20 degrees throughout to improve ability to scan for safety with driving Baseline: see above Goal status: in progress  4.  Patient will be able to elevate her shoulders in sitting to 90 degrees to improve ability to fix her hair and reach into cabinets Baseline: see above Goal status: in progress   5.  Patient will improve NDI score by 6 points (20 or less) to demonstrate improved perceived function  Baseline: 26/50 Goal status: in progress  PLAN:  PT FREQUENCY: 2x/week  PT DURATION: 4 weeks  PLANNED INTERVENTIONS: 97164- PT Re-evaluation, 97110-Therapeutic exercises, 97530- Therapeutic activity, 97112- Neuromuscular re-education, 97535- Self Care, 02859- Manual therapy, [id number]- Gait training, [id number]- Orthotic Fit/training, [id number]- Canalith repositioning, [id number]- Aquatic Therapy, 97760- Splinting, [phone]- Wound care (first 20 sq cm), 97598- Wound care (each additional 20 sq cm)Patient/Family education, Balance training, Stair training, Taping, Dry Needling, Joint mobilization, Joint manipulation, Spinal manipulation, Spinal mobilization, Scar mobilization, and DME instructions.   PLAN FOR NEXT SESSION:  does not tolerate supine very well; cervical  mobility, UE mobility; postural strengthening; scapular retractions; UE mobility next visit   11:45 AM, 12/20/23 Garwood Wentzell Small Lynnsey Barbara MPT [facility] [place] [phone] Ph:[phone]

## 2023-12-27 ENCOUNTER — Ambulatory Visit (HOSPITAL_COMMUNITY)

## 2023-12-27 DIAGNOSIS — M542 Cervicalgia: Secondary | ICD-10-CM | POA: Diagnosis not present

## 2023-12-27 DIAGNOSIS — R29898 Other symptoms and signs involving the musculoskeletal system: Secondary | ICD-10-CM

## 2023-12-27 DIAGNOSIS — M25519 Pain in unspecified shoulder: Secondary | ICD-10-CM

## 2023-12-27 NOTE — Therapy (Signed)
 OUTPATIENT PHYSICAL THERAPY CERVICAL TREATMENT/DISCHARGE PHYSICAL THERAPY DISCHARGE SUMMARY  Visits from Start of Care: 3  Current functional level related to goals / functional outcomes: See below   Remaining deficits: See below   Education / Equipment: HEP   Patient agrees to discharge. Patient goals were met. Patient is being discharged due to meeting the stated rehab goals.    Patient Name: Alejandra Sanders MRN: [id number] DOB:10-20-1960, 63 y.o., female Today's Date: 12/27/2023  END OF SESSION:  PT End of Session - 12/27/23 0948     Visit Number 3    Number of Visits 8    Date for Recertification  01/15/24    Authorization Type [place] Medicaid Healthy Blue    Authorization Time Period 6 visits from 12/18/23 to 02/15/24    Authorization - Visit Number 2    Authorization - Number of Visits 6    Progress Note Due on Visit 6    PT Start Time [phone]    PT Stop Time 1011    PT Time Calculation (min) 23 min    Activity Tolerance Patient tolerated treatment well    Behavior During Therapy [facility] for tasks assessed/performed          Past Medical History:  Diagnosis Date   Arthritis    Calculus of gallbladder with chronic cholecystitis without obstruction    Chronic cholecystitis 06/11/2018   Hypertension    Seizure (HCC)    had seizurea as child; unknow etiology; was on phenobarbital for a few years and then was taken off meds. No seizure in 40 years.   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   Primary osteoarthritis of both shoulders 10/20/2023   Acute  pain of both knees 10/20/2023   DISH (diffuse idiopathic skeletal hyperostosis) 10/20/2023   Wheezing 05/30/2023   Prediabetes 05/30/2023   DOE (dyspnea on exertion) 05/03/2023   Vitamin D  deficiency 01/30/2023   Primary insomnia 09/30/2022   Mixed hyperlipidemia 09/30/2022   Caregiver with fatigue 07/14/2022   Cardiomegaly 07/14/2022   Calcification of coronary artery 07/14/2022   OSA (obstructive sleep apnea) 05/05/2022   Allergic sinusitis 05/05/2022   Encounter for general adult medical examination with abnormal findings 01/27/2022   Chronic fatigue 01/27/2022   Primary localized osteoarthrosis of multiple sites 07/27/2021   DDD (degenerative disc disease), lumbar 02/24/2021   Encounter for examination following treatment at hospital 02/24/2021   Obesity 01/26/2021   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   Neuropathy 01/26/2021   Other long term (current) drug therapy 01/26/2021   Right anterior shoulder pain 01/26/2021   Essential hypertension 12/15/2020   Rheumatoid arthritis (HCC) 12/15/2020   Tobacco abuse 12/15/2020    PCP: Tobie Downs MD  REFERRING PROVIDER: Tobie Downs MD REFERRING DIAG:  M19.011,M19.012 (ICD-10-CM) - Primary osteoarthritis of both shoulders  M43.22 (ICD-10-CM) - Cervical spine ankylosis    THERAPY DIAG:  Neck and shoulder pain  Weakness of right shoulder  Weakness of left shoulder  Decreased range of motion of neck  Rationale for Evaluation and Treatment: Rehabilitation  ONSET DATE: 4 months  SUBJECTIVE:                                                                                                                                                                                                         SUBJECTIVE STATEMENT: Feeling so much better! Not having any pain currently  Eval: Insidious onset about 4 months ago; couldn't lift my arms; went to hospital and told her was inflammation.  Initially was right side and now left side and  now going down her arms; right side is worse than left.  Saw Patel who gave her medication and then had to go back because of continued pain.  Made a referral to orthopedic MD; sees Dr. Onesimo tomorrow and to therapy.  Reports constant pain.   Hand dominance: Right  PERTINENT HISTORY:  RA DDD OA DISH in neck Left leg is a little swollen  PAIN:  Are you having pain? Yes: NPRS scale: 9.5/10; best 5/10 Pain location: neck, shoulders and down both arms and right worse than left Pain description: aching Aggravating factors: laying down straight Relieving factors: pain medication, heat patches  PRECAUTIONS: None   WEIGHT BEARING RESTRICTIONS: No  FALLS:  Has patient fallen in last 6 months? No    OCCUPATION: not working  PLOF: Independent with basic ADLs  PATIENT GOALS: less pain  NEXT MD VISIT: tomorrow sees Dr. Onesimo  OBJECTIVE:  Note: Objective measures were completed at Evaluation unless otherwise noted.  DIAGNOSTIC FINDINGS:  MPRESSION: 1. No acute osseous abnormality in the cervical spine. 2. Diffuse idiopathic skeletal hyperostosis (DISH). No cervical spinal stenosis suspected by CT. 3. 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  MPRESSION: No acute osseous abnormality identified.   Mild to moderate left glenohumeral and AC joint degeneration.   IMPRESSION: No acute osseous abnormality identified.   Mild to moderate chronic glenohumeral and AC joint degeneration.  PATIENT SURVEYS:  NDI:  NECK DISABILITY INDEX  Date: 12/18/23 Score                                Total 26/50; 52%   Minimum Detectable Change (90% confidence): 5 points or 10% points  COGNITION: Overall cognitive status: Within functional limits for tasks assessed  SENSATION: WFL  POSTURE: rounded shoulders and forward head  PALPATION: Very tight mid upper traps; soreness lower cervical spine and upper thoracic spine  paraspinals   CERVICAL ROM:   Active ROM AROM (deg) eval AROM 12/27/23  Flexion 34 40  Extension  14* 42  Right lateral flexion 25 27  Left lateral flexion 22 35  Right rotation 43 70  Left rotation 54 72   (Blank rows = not tested)  UPPER EXTREMITY ROM:  Active ROM Right eval Left eval Right 12/27/23 Left 12/27/23  Shoulder flexion 32 40 125 125  Shoulder extension      Shoulder abduction      Shoulder adduction      Shoulder extension      Shoulder internal rotation      Shoulder external rotation      Elbow flexion      Elbow extension      Wrist flexion      Wrist extension      Wrist ulnar deviation      Wrist radial deviation      Wrist pronation      Wrist supination       (Blank rows = not tested)  UPPER EXTREMITY MMT:  MMT Right eval Left eval  Shoulder flexion (sitting) 2- 2-  Shoulder extension    Shoulder abduction    Shoulder adduction    Shoulder extension    Shoulder internal rotation    Shoulder external rotation    Middle trapezius    Lower trapezius    Elbow flexion 4+ 4+  Elbow extension 4+ 4+  Wrist flexion    Wrist extension    Wrist ulnar deviation    Wrist radial deviation    Wrist pronation    Wrist supination    Grip strength     (Blank rows = not tested)  FUNCTIONAL TESTS:    TREATMENT DATE:  12/27/23 Sitting with moist heat to cervical spine x 5' Cervical AROM and shoulder AROM see above NDI 9/50 18% Goal review  12/20/23 Sitting with moist heat while reviewing goals x 5' Sitting Upper trap stretch 5 x 20 each Cervical rotation 10 hold x 5 each STM to bilateral upper traps and cervical spine x 15' to decrease tissue tightness and improve cervical mobility    12/18/23 physical therapy evaluation and HEP instruction                                                                                                                                 PATIENT EDUCATION:  Education details: Patient educated on exam  findings, POC, scope of PT, HEP, and what to expect next visit. Person educated: Patient Education method: Explanation, Demonstration, and Handouts Education comprehension: verbalized understanding, returned demonstration, verbal cues required, and tactile cues required  HOME EXERCISE PROGRAM: Access Code: [email or web address] URL: [email or web address] Date: 12/18/2023 Prepared by: [facility] - Re[facility]  Exercises - Seated Cervical Retraction  - 4 x daily - 7 x weekly - 1 sets - 10 reps - Seated Cervical Rotation AROM  - 2 x daily - 7 x weekly - 1 sets - 10 reps - Seated Gentle Upper Trapezius Stretch  - 2 x daily -  7 x weekly - 1 sets - 10 reps - 20 sec hold - Seated Shoulder Flexion Towel Slide at Table Top  - 2 x daily - 7 x weekly - 1 sets - 10 reps - 5 sec hold - Shoulder Flexion Wall Slide with Towel  - 2 x daily - 7 x weekly - 1 sets - 10 reps  ASSESSMENT:  CLINICAL IMPRESSION: Today's session started with moist heat to cervical spine as we discuss patient symptoms; she is currently pain free so did a progress note.  Patient has met all set rehab goals and is agreeable to discharge at this time.   Eval: Patient is a 63 y.o. female who was seen today for physical therapy evaluation and treatment for neck and shoulder pain. Patient demonstrates decreased strength, ROM restriction, reduced flexibility, increased tenderness to palpation and postural abnormalities which are likely contributing to symptoms of pain and are negatively impacting patient ability to perform ADLs. Patient will benefit from skilled physical therapy services to address these deficits to reduce pain and improve level of function with ADLs  OBJECTIVE IMPAIRMENTS: decreased activity tolerance, decreased ROM, decreased strength, increased fascial restrictions, impaired perceived functional ability, impaired UE functional use, and pain.   ACTIVITY LIMITATIONS: carrying, lifting, bending, sitting, and reach over  head  PARTICIPATION LIMITATIONS: meal prep, cleaning, laundry, driving, shopping, and community activity  PERSONAL FACTORS: 1 comorbidity: RA are also affecting patient's functional outcome.   REHAB POTENTIAL: Good  CLINICAL DECISION MAKING: Evolving/moderate complexity  EVALUATION COMPLEXITY: Moderate   GOALS: Goals reviewed with patient? No  SHORT TERM GOALS: Target date: 01/01/2024  patient will be independent with initial HEP  Baseline:  Goal status: met  2.  Patient will report 50% improvement overall  Baseline:  Goal status: met   LONG TERM GOALS: Target date: 01/15/2024  Patient will be independent in self management strategies to improve quality of life and functional outcomes.  Baseline:  Goal status: met  2.  Patient will report 75% improvement overall  Baseline:  Goal status: met  3.  Patient will increase cervical mobility by 20 degrees throughout to improve ability to scan for safety with driving Baseline: see above Goal status: met  4.  Patient will be able to elevate her shoulders in sitting to 90 degrees to improve ability to fix her hair and reach into cabinets Baseline: see above Goal status: met  5.  Patient will improve NDI score by 6 points (20 or less) to demonstrate improved perceived function  Baseline: 26/50; 9/50 12/27/23 Goal status: met  PLAN:  PT FREQUENCY: 2x/week  PT DURATION: 4 weeks  PLANNED INTERVENTIONS: 97164- PT Re-evaluation, 97110-Therapeutic exercises, 97530- Therapeutic activity, 97112- Neuromuscular re-education, 97535- Self Care, 02859- Manual therapy, [id number]- Gait training, [phone]- Orthotic Fit/training, [phone]- Canalith repositioning, [id number]- Aquatic Therapy, 97760- Splinting, [phone]- Wound care (first 20 sq cm), 97598- Wound care (each additional 20 sq cm)Patient/Family education, Balance training, Stair training, Taping, Dry Needling, Joint mobilization, Joint manipulation, Spinal manipulation, Spinal mobilization,  Scar mobilization, and DME instructions.   PLAN FOR NEXT SESSION:  discharge  10:13 AM, 12/27/23 Malone Vanblarcom Small Lamichael Youkhana MPT [facility] [place] [phone]

## 2024-01-02 ENCOUNTER — Encounter (HOSPITAL_COMMUNITY)

## 2024-01-04 ENCOUNTER — Encounter (HOSPITAL_COMMUNITY)

## 2024-01-15 ENCOUNTER — Encounter (HOSPITAL_COMMUNITY)

## 2024-01-17 ENCOUNTER — Other Ambulatory Visit: Payer: Self-pay

## 2024-01-17 ENCOUNTER — Encounter (HOSPITAL_COMMUNITY)

## 2024-01-17 DIAGNOSIS — E042 Nontoxic multinodular goiter: Secondary | ICD-10-CM

## 2024-01-17 DIAGNOSIS — E0510 Thyrotoxicosis with toxic single thyroid nodule without thyrotoxic crisis or storm: Secondary | ICD-10-CM

## 2024-01-17 DIAGNOSIS — E890 Postprocedural hypothyroidism: Secondary | ICD-10-CM

## 2024-01-19 LAB — T3, FREE: T3, Free: 3.2 pg/mL (ref 2.0–4.4)

## 2024-01-19 LAB — TSH: TSH: 0.913 u[IU]/mL (ref 0.450–4.500)

## 2024-01-19 LAB — T4, FREE: Free T4: 1.38 ng/dL (ref 0.82–1.77)

## 2024-01-22 ENCOUNTER — Encounter (HOSPITAL_COMMUNITY)

## 2024-01-24 ENCOUNTER — Other Ambulatory Visit: Payer: Self-pay | Admitting: Family Medicine

## 2024-01-24 ENCOUNTER — Encounter (HOSPITAL_COMMUNITY): Admitting: Physical Therapy

## 2024-01-24 DIAGNOSIS — M19011 Primary osteoarthritis, right shoulder: Secondary | ICD-10-CM

## 2024-01-25 ENCOUNTER — Ambulatory Visit: Payer: Medicaid Other | Admitting: Nurse Practitioner

## 2024-01-25 ENCOUNTER — Encounter: Payer: Self-pay | Admitting: Nurse Practitioner

## 2024-01-25 VITALS — BP 134/80 | HR 73 | Ht 65.0 in | Wt 203.0 lb

## 2024-01-25 DIAGNOSIS — E042 Nontoxic multinodular goiter: Secondary | ICD-10-CM | POA: Diagnosis not present

## 2024-01-25 DIAGNOSIS — E0510 Thyrotoxicosis with toxic single thyroid nodule without thyrotoxic crisis or storm: Secondary | ICD-10-CM

## 2024-01-25 DIAGNOSIS — E890 Postprocedural hypothyroidism: Secondary | ICD-10-CM

## 2024-01-25 NOTE — Progress Notes (Signed)
 01/25/2024     Endocrinology Follow Up Note    Subjective:    Patient ID: Alejandra Sanders, female    DOB: 07-Oct-1960, PCP Tobie Suzzane POUR, MD.   Past Medical History:  Diagnosis Date   Arthritis    Calculus of gallbladder with chronic cholecystitis without obstruction    Chronic cholecystitis 06/11/2018   Hypertension    Seizure (HCC)    had seizurea as child; unknow etiology; was on phenobarbital for a few years and then was taken off meds. No seizure in 40 years.   Thyroid  disease    Trichimoniasis 02/11/2021   Treated with flagyl  02/11/21    Past Surgical History:  Procedure Laterality Date   CHOLECYSTECTOMY N/A 06/11/2018   Procedure: LAPAROSCOPIC CHOLECYSTECTOMY;  Surgeon: Kallie Manuelita BROCKS, MD;  Location: [facility];  Service: General;  Laterality: N/A;   GALLBLADDER SURGERY  06/11/2018   THYROID  SURGERY     removal of thyroid     Social History   Socioeconomic History   Marital status: Single    Spouse name: Not on file   Number of children: Not on file   Years of education: Not on file   Highest education level: Not on file  Occupational History   Not on file  Tobacco Use   Smoking status: Former    Current packs/day: 0.00    Average packs/day: 0.3 packs/day for 35.0 years (8.8 ttl pk-yrs)    Types: Cigarettes    Quit date: 08/18/2023    Years since quitting: 0.4   Smokeless tobacco: Never   Tobacco comments:    states smokes 3 cig daily only  Substance and Sexual Activity   Alcohol use: No   Drug use: No   Sexual activity: Not Currently    Birth control/protection: Post-menopausal  Other Topics Concern   Not on file  Social History Narrative   Not on file   Social Drivers of Health   Financial Resource Strain: Low Risk  (02/08/2021)   Overall Financial Resource Strain (CARDIA)    Difficulty of Paying Living Expenses: Not hard at all  Food Insecurity: No Food Insecurity (02/08/2021)   Hunger Vital Sign    Worried About Running Out of Food  in the Last Year: Never true    Ran Out of Food in the Last Year: Never true  Transportation Needs: No Transportation Needs (02/08/2021)   PRAPARE - Administrator, Civil Service (Medical): No    Lack of Transportation (Non-Medical): No  Physical Activity: Insufficiently Active (02/08/2021)   Exercise Vital Sign    Days of Exercise per Week: 2 days    Minutes of Exercise per Session: 20 min  Stress: No Stress Concern Present (02/08/2021)   [employer] of Occupational Health - Occupational Stress Questionnaire    Feeling of Stress : Only a little  Social Connections: Moderately Integrated (02/08/2021)   Social Connection and Isolation Panel    Frequency of Communication with Friends and Family: More than three times a week    Frequency of Social Gatherings with Friends and Family: More than three times a week    Attends Religious Services: More than 4 times per year    Active Member of [employer] or Organizations: Yes    Attends Engineer, structural: More than 4 times per year    Marital Status: Never married    Family History  Problem Relation Age of Onset   Aneurysm Maternal Grandmother    Hypertension Father  Hypertension Mother    Diabetes Mother    Heart attack Brother    Hypertension Sister    Kidney failure Sister    Hypertension Sister    Hypertension Sister    Hypertension Son     Outpatient Encounter Medications as of 01/25/2024  Medication Sig   albuterol  (VENTOLIN  HFA) 108 (90 Base) MCG/ACT inhaler Inhale 1-2 puffs into the lungs every 6 (six) hours as needed for wheezing or shortness of breath.   amLODipine  (NORVASC ) 10 MG tablet Take 1 tablet (10 mg total) by mouth daily.   baclofen  (LIORESAL ) 10 MG tablet Take 1 tablet (10 mg total) by mouth 2 (two) times daily as needed for muscle spasms.   diclofenac Sodium (VOLTAREN) 1 % GEL See admin instructions.   hydrochlorothiazide  (MICROZIDE ) 12.5 MG capsule Take 1 capsule (12.5 mg total) by mouth  daily.   HYDROcodone -acetaminophen  (NORCO/VICODIN) 5-325 MG tablet Take 1 tablet by mouth every 6 (six) hours as needed for severe pain (pain score 7-10).   hydroxychloroquine (PLAQUENIL) 200 MG tablet Take 2 tablets by mouth daily.   labetalol  (NORMODYNE ) 200 MG tablet Take 1 tablet (200 mg total) by mouth 2 (two) times daily.   meloxicam  (MOBIC ) 15 MG tablet Take 1 tablet by mouth once daily   metoprolol  tartrate (LOPRESSOR ) 100 MG tablet Take 1 tablet by mouth two hours prior to CT scan.   mometasone  (NASONEX ) 50 MCG/ACT nasal spray Place 1 spray into the nose at bedtime.   ORENCIA  CLICKJECT 125 MG/ML SOAJ Inject 1 pen into the skin once a week. Takes on Tuesdays   rosuvastatin  (CRESTOR ) 5 MG tablet Take 1 tablet (5 mg total) by mouth daily.   Vitamin D , Ergocalciferol , (DRISDOL ) 1.25 MG (50000 UNIT) CAPS capsule Take 1 capsule (50,000 Units total) by mouth every 7 (seven) days.   No facility-administered encounter medications on file as of 01/25/2024.    ALLERGIES: Allergies  Allergen Reactions   Lisinopril Swelling    VACCINATION STATUS: Immunization History  Administered Date(s) Administered   Influenza, Seasonal, Injecte, Preservative Fre 01/30/2023   Influenza,inj,Quad PF,6+ Mos 12/15/2020, 01/27/2022   Moderna SARS-COV2 Booster Vaccination 06/14/2019, 02/22/2020   Moderna Sars-Covid-2 Vaccination 05/16/2019   PNEUMOCOCCAL CONJUGATE-20 05/30/2023   Tdap 09/03/2022   Zoster Recombinant(Shingrix ) 09/27/2022, 01/30/2023     HPI  Alejandra Sanders is 63 y.o. female who presents today with a medical history as above. she is being seen in follow up after being seen in consultation for hyperthyroidism requested by Tobie Suzzane POUR, MD.  She has a history of partial thyroidectomy as a result of large goiter back in 2001.  She never needed thyroid  hormone replacement after her surgery.  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  she denies dysphagia, choking, shortness of breath,  no recent voice change.    she does have family history of thyroid  dysfunction in her sister and cousin (both requiring surgery 1 with goiter the other with hyperthyroidism), but denies family hx of thyroid  cancer.  she is not on any anti-thyroid  medications nor on any thyroid  hormone supplements. Denies use of Biotin containing supplements.   Review of systems  Constitutional: + Minimally fluctuating body weight,  current Body mass index is 33.78 kg/m. , no fatigue, no subjective hyperthermia, no subjective hypothermia Eyes: no blurry vision, no xerophthalmia ENT: no sore throat, no nodules palpated in throat, no dysphagia/odynophagia, no hoarseness Cardiovascular: no chest pain, no shortness of breath, no palpitations, no leg swelling Respiratory: no cough, no shortness  of breath Gastrointestinal: no nausea/vomiting/diarrhea Musculoskeletal: no muscle/joint aches Skin: no rashes, no hyperemia Neurological: no tremors, no numbness, no tingling, no dizziness Psychiatric: no depression, no anxiety   Objective:    BP 134/80 (BP Location: Left Arm, Patient Position: Sitting, Cuff Size: Large)   Pulse 73   Ht 5' 5 (1.651 m)   Wt 203 lb (92.1 kg)   BMI 33.78 kg/m   Wt Readings from Last 3 Encounters:  01/25/24 203 lb (92.1 kg)  12/19/23 206 lb (93.4 kg)  12/01/23 204 lb (92.5 kg)     BP Readings from Last 3 Encounters:  01/25/24 134/80  12/19/23 (!) 150/91  12/01/23 (!) 164/103         Physical Exam- Limited  Constitutional:  Body mass index is 33.78 kg/m. , not in acute distress, normal state of mind Eyes:  EOMI, no exophthalmos Musculoskeletal: no gross deformities, strength intact in all four extremities, no gross restriction of joint movements Skin:  no rashes, no hyperemia Neurological: no tremor with outstretched hands   CMP     Component Value Date/Time   NA 140 05/30/2023 0842   K 3.9 05/30/2023 0842   CL 103 05/30/2023 0842   CO2 23 05/30/2023 0842    GLUCOSE 105 (H) 05/30/2023 0842   GLUCOSE 82 06/13/2022 1706   BUN 9 05/30/2023 0842   CREATININE 0.61 05/30/2023 0842   CALCIUM  9.1 05/30/2023 0842   PROT 6.5 05/30/2023 0842   ALBUMIN 4.0 05/30/2023 0842   AST 10 05/30/2023 0842   ALT 9 05/30/2023 0842   ALKPHOS 135 (H) 05/30/2023 0842   BILITOT 0.2 05/30/2023 0842   GFRNONAA >60 06/13/2022 1706   GFRAA >60 05/27/2018 1528     CBC    Component Value Date/Time   WBC 5.9 01/23/2023 1054   WBC 5.9 06/13/2022 1706   RBC 5.08 01/23/2023 1054   RBC 5.25 (H) 06/13/2022 1706   HGB 13.5 01/23/2023 1054   HCT 41.2 01/23/2023 1054   PLT 295 01/23/2023 1054   MCV 81 01/23/2023 1054   MCH 26.6 01/23/2023 1054   MCH 26.1 06/13/2022 1706   MCHC 32.8 01/23/2023 1054   MCHC 32.1 06/13/2022 1706   RDW 14.5 01/23/2023 1054   LYMPHSABS 2.7 01/23/2023 1054   MONOABS 0.4 06/13/2022 1706   EOSABS 0.2 01/23/2023 1054   BASOSABS 0.0 01/23/2023 1054     Diabetic Labs (most recent): Lab Results  Component Value Date   HGBA1C 6.1 (H) 11/08/2023   HGBA1C 6.2 (H) 05/30/2023   HGBA1C 6.0 (H) 01/23/2023    Lipid Panel     Component Value Date/Time   CHOL 162 05/30/2023 0842   TRIG 128 05/30/2023 0842   HDL 30 (L) 05/30/2023 0842   CHOLHDL 5.4 (H) 05/30/2023 0842   LDLCALC 109 (H) 05/30/2023 0842   LABVLDL 23 05/30/2023 0842     Lab Results  Component Value Date   TSH 0.913 01/18/2024   TSH 1.300 05/30/2023   TSH 1.070 01/23/2023   TSH 0.699 07/19/2022   TSH 0.888 01/19/2022   TSH 1.550 09/16/2021   TSH 1.170 05/26/2021   TSH 2.120 03/25/2021   TSH 0.048 (L) 01/07/2021   TSH 0.053 (L) 12/28/2020   FREET4 1.38 01/18/2024   FREET4 1.24 05/30/2023   FREET4 1.28 01/23/2023   FREET4 1.23 07/19/2022   FREET4 1.32 01/19/2022   FREET4 1.19 09/16/2021   FREET4 1.17 05/26/2021   FREET4 1.00 03/25/2021   FREET4 1.32 01/07/2021  US  Thyroid  01/08/21 CLINICAL DATA:  62 year old female with a history hyperthyroidism    EXAM: THYROID  ULTRASOUND   TECHNIQUE: Ultrasound examination of the thyroid  gland and adjacent soft tissues was performed.   COMPARISON:  None.   FINDINGS: Parenchymal Echotexture: Markedly heterogenous   Isthmus: 0.8 cm   Right lobe: 4.8 cm x 2.4 cm x 2.5 cm   Left lobe: 5.9 cm x 3.3 cm x 4.4 cm   _________________________________________________________   Estimated total number of nodules >/= 1 cm: 6-10   Number of spongiform nodules >/=  2 cm not described below (TR1): 0   Number of mixed cystic and solid nodules >/= 1.5 cm not described below (TR2): 0   _________________________________________________________   Nodule # 1:   Location: Isthmus; mid   Maximum size: 1.3 cm; Other 2 dimensions: 0.9 cm x 0.7 cm   Composition: cannot determine (2)   Echogenicity: isoechoic (1)   Shape: not taller-than-wide (0)   Margins: ill-defined (0)   Echogenic foci: none (0)   ACR TI-RADS total points: 3.   ACR TI-RADS risk category: TR3 (3 points).   ACR TI-RADS recommendations:   Nodule does not meet criteria for surveillance or biopsy   _________________________________________________________   Nodule # 2:   Location: Right; Superior   Maximum size: 3.0 cm; Other 2 dimensions: 2.1 cm x 2.0 cm   Composition: solid/almost completely solid (2)   Echogenicity: isoechoic (1)   Shape: not taller-than-wide (0)   Margins: ill-defined (0)   Echogenic foci: none (0)   ACR TI-RADS total points: 3.   ACR TI-RADS risk category: TR3 (3 points).   ACR TI-RADS recommendations:   Nodule meets criteria for biopsy   _________________________________________________________   Nodule # 3:   Location: Right; Mid   Maximum size: 2.0 cm; Other 2 dimensions: 1.9 cm x 1.5 cm   Composition: cannot determine (2)   Echogenicity: isoechoic (1)   Shape: not taller-than-wide (0)   Margins: ill-defined (0)   Echogenic foci: none (0)   ACR TI-RADS total points:  3.   ACR TI-RADS risk category: TR3 (3 points).   ACR TI-RADS recommendations:   Nodule meets criteria for surveillance   _________________________________________________________   Nodule # 4:   Location: Left; Superior   Maximum size: 1.7 cm; Other 2 dimensions: 1.7 cm x 1.5 cm   Composition: solid/almost completely solid (2)   Echogenicity: isoechoic (1)   Shape: not taller-than-wide (0)   Margins: smooth (0)   Echogenic foci: none (0)   ACR TI-RADS total points: 3.   ACR TI-RADS risk category: TR3 (3 points).   ACR TI-RADS recommendations:   Nodule meets criteria for surveillance   _________________________________________________________   Nodule # 5:   Location: Left; Mid   Maximum size: 2.3 cm; Other 2 dimensions: 2.0 cm x 1.8 cm   Composition: solid/almost completely solid (2)   Echogenicity: hyperechoic (1)   Shape: not taller-than-wide (0)   Margins: ill-defined (0)   Echogenic foci: none (0)   ACR TI-RADS total points: 3.   ACR TI-RADS risk category: TR3 (3 points).   ACR TI-RADS recommendations:   Nodule meets criteria for surveillance   _________________________________________________________   Nodule # 6:   Location: Left; Mid   Maximum size: 1.8 cm; Other 2 dimensions: 1.5 cm x 1.4 cm   Composition: mixed cystic and solid (1)   Echogenicity: isoechoic (1)   Shape: not taller-than-wide (0)   Margins: ill-defined (0)   Echogenic foci: none (0)  ACR TI-RADS total points: 2.   ACR TI-RADS risk category: TR2 (2 points).   ACR TI-RADS recommendations:   Cystic nodule does not meet criteria for surveillance or biopsy   _________________________________________________________   Nodule # 7:   Location: Left; Inferior   Maximum size: 2.4 cm; Other 2 dimensions: 2.4 cm x 2.1 cm   Composition: solid/almost completely solid (2)   Echogenicity: isoechoic (1)   Shape: not taller-than-wide (0)   Margins: ill-defined  (0)   Echogenic foci: none (0)   ACR TI-RADS total points: 3.   ACR TI-RADS risk category: TR3 (3 points).   ACR TI-RADS recommendations:   Nodule meets criteria for surveillance   _________________________________________________________   No adenopathy   IMPRESSION: Multinodular thyroid .   Right superior thyroid  nodule (labeled 2, 3.0 cm, TR 3) meets criteria for biopsy, as designated by the newly established ACR TI-RADS criteria, and referral for biopsy is recommended.   Right inferior thyroid  nodule (labeled 3), left superior thyroid  nodules (labeled 4 and labeled 5), and left inferior thyroid  nodule (labeled 7) all meet criteria for surveillance, as designated by the newly established ACR TI-RADS criteria. Surveillance ultrasound study recommended to be performed annually up to 5 years.   Recommendations follow those established by the new ACR TI-RADS criteria (J Am Coll Radiol 2017;14:587-595).     Electronically Signed   By: Ami Bellman D.O.   On: 01/08/2021 14:12 --------------------------------------------------------------------------------------------------------------  Uptake and scan 01/26/21 CLINICAL DATA:  Suppressed TSH, prior partial thyroidectomy, multinodular goiter question toxic nodule, hyperthyroidism   EXAM: THYROID  SCAN AND UPTAKE - 4 AND 24 HOURS   TECHNIQUE: Following oral administration of I-123 capsule, anterior planar imaging was acquired at 24 hours. Thyroid  uptake was calculated with a thyroid  probe at 4-6 hours and 24 hours.   RADIOPHARMACEUTICALS:  315 uCi I-123 sodium iodide p.o.   COMPARISON:  Thyroid  ultrasound 01/08/2021   FINDINGS: Focal hot nodule in the inferior pole of LEFT thyroid  lobe consistent with toxic adenoma.   This corresponds to a 2.5 cm diameter nodule on ultrasound at the inferior pole.   Suppression of uptake in remaining thyroid  lobes.   No additional warm nodules.   Subtle areas of decreased  tracer uptake, may represent suppression or subtle nodules.   4 hour I-123 uptake = 6.3 % (normal 5-20%)   24 hour I-123 uptake = 14.8% (normal 10-30%)   IMPRESSION: Normal 4 hour and 24 hour radio iodine uptakes.   Hot nodule inferior pole LEFT thyroid  lobe corresponding to a 2.5 cm diameter nodule seen on prior ultrasound consistent with toxic adenoma.   Suppression of uptake in remaining thyroid  tissue.     Electronically Signed   By: Oneil Kiss M.D.   On: 01/27/2021 16:01   Latest Reference Range & Units 07/19/22 08:57 01/23/23 10:54 05/30/23 08:42 01/18/24 10:00  TSH 0.450 - 4.500 uIU/mL 0.699 1.070 1.300 0.913  Triiodothyronine,Free,Serum 2.0 - 4.4 pg/mL 3.3 3.4  3.2  T4,Free(Direct) 0.82 - 1.77 ng/dL 8.76 8.71 8.75 8.61    Assessment & Plan:   1. Hyperthyroidism r/t toxic thyroid  nodule- s/p RAI therapy on 02/11/2021  she is being seen at a kind request of Tobie Suzzane POUR, MD.  Her uptake and scan showed hot nodule in left inferior pole, consistent with toxic thyroid  nodule.   -She had RAI ablation on 02/11/21.   -Her repeat thyroid  function tests show euthyroid presentation.  She does not need thyroid  hormone supplement or antithyroid medications at this time. There  is a possibility that she will not need thyroid  hormone replacement in the future.  Will repeat thyroid  function tests in 1 year for surveillance, or sooner if needed.   -Patient is advised to maintain close follow up with Tobie Suzzane POUR, MD for primary care needs.    I spent  32  minutes in the care of the patient today including review of labs from Thyroid  Function, CMP, and other relevant labs ; imaging/biopsy records (current and previous including abstractions from other facilities); face-to-face time discussing  her lab results and symptoms, medications doses, her options of short and long term treatment based on the latest standards of care / guidelines;   and documenting the  encounter.  Miral Jobin  participated in the discussions, expressed understanding, and voiced agreement with the above plans.  All questions were answered to her satisfaction. she is encouraged to contact clinic should she have any questions or concerns prior to her return visit.    Follow up plan: Return in about 1 year (around 01/24/2025) for Thyroid  follow up, Previsit labs.   Thank you for involving me in the care of this pleasant patient, and I will continue to update you with her progress.  Benton Rio, [facility] [address] [place], [place] [address] Phone: [phone] Fax: [phone]  01/25/2024, 8:39 AM

## 2024-01-29 ENCOUNTER — Encounter (HOSPITAL_COMMUNITY)

## 2024-02-01 ENCOUNTER — Encounter (HOSPITAL_COMMUNITY)

## 2024-03-13 ENCOUNTER — Ambulatory Visit: Admitting: Internal Medicine

## 2024-03-13 ENCOUNTER — Encounter: Payer: Self-pay | Admitting: Internal Medicine

## 2024-03-13 VITALS — BP 135/81 | HR 91 | Ht 65.0 in | Wt 202.2 lb

## 2024-03-13 DIAGNOSIS — Z23 Encounter for immunization: Secondary | ICD-10-CM | POA: Diagnosis not present

## 2024-03-13 DIAGNOSIS — R7303 Prediabetes: Secondary | ICD-10-CM

## 2024-03-13 DIAGNOSIS — Z0001 Encounter for general adult medical examination with abnormal findings: Secondary | ICD-10-CM | POA: Diagnosis not present

## 2024-03-13 DIAGNOSIS — M069 Rheumatoid arthritis, unspecified: Secondary | ICD-10-CM

## 2024-03-13 DIAGNOSIS — M1991 Primary osteoarthritis, unspecified site: Secondary | ICD-10-CM | POA: Diagnosis not present

## 2024-03-13 DIAGNOSIS — I10 Essential (primary) hypertension: Secondary | ICD-10-CM | POA: Diagnosis not present

## 2024-03-13 MED ORDER — AMLODIPINE BESYLATE 10 MG PO TABS: 10.0000 mg | ORAL_TABLET | Freq: Every day | ORAL | 3 refills | Status: AC

## 2024-03-13 MED ORDER — PREDNISONE 10 MG (21) PO TBPK: ORAL_TABLET | ORAL | 0 refills | Status: DC

## 2024-03-13 MED ORDER — HYDROCHLOROTHIAZIDE 12.5 MG PO TABS: 6.2500 mg | ORAL_TABLET | Freq: Every day | ORAL | 3 refills | Status: AC

## 2024-03-13 MED ORDER — LABETALOL HCL 200 MG PO TABS: 200.0000 mg | ORAL_TABLET | Freq: Two times a day (BID) | ORAL | 3 refills | Status: AC

## 2024-03-13 NOTE — Assessment & Plan Note (Signed)
 B/l knee pain likely due to OA Sterapred taper prescribed due to diffuse arthralgias Meloxicam  as needed for pain Followed by rheumatology

## 2024-03-13 NOTE — Assessment & Plan Note (Addendum)
 Followed by Rheumatology On Plaquenil and Orencia  Recent worsening of arthralgias and back pain, Sterapred taper prescribed Check CBC, CMP, CRP

## 2024-03-13 NOTE — Assessment & Plan Note (Signed)
 Lab Results  Component Value Date   HGBA1C 6.1 (H) 11/08/2023   Advised to follow low-carb diet

## 2024-03-13 NOTE — Progress Notes (Signed)
 Established Patient Office Visit  Subjective:  Patient ID: Alejandra Sanders, female    DOB: [birth year]  Age: 63 y.o. MRN: [id number]  CC:  Chief Complaint  Patient presents with   Hypertension    Follow up   Neck Pain    Follow up    Leg Pain    Reports sx of right leg pain , hurts to stand   Annual Exam    HPI Alejandra Sanders is a 63 y.o. female with past medical history of HTN, RA, toxic thyroid  nodule and tobacco abuse who presents for f/u of her chronic medical conditions.  HTN: BP is well-controlled. Takes amlodipine  and labetalol  regularly.  She has not been taking HCTZ regularly due to nocturia.  Patient denies headache, dizziness, chest pain, or palpitations.  She had evaluation of CAD due to exertional dyspnea. She had been nuclear stress test, which was intermediate. She is followed by Cardiology. She takes Crestor  for HLD.  She has a history of RA, for which she takes Plaquenil and Orencia . She follows up with rheumatologist in [place]. Denies any vision problem currently.  She complains of bilateral knee and leg pain, which is worse with walking and prolonged standing. She c/o bilateral shoulder pain, radiating from neck area for the last 2 weeks. She was given oral Prednisone  in the last visit, which had improved her symptoms initially.  She reports neck muscle spasms at times.  Denies any numbness or tingling of the UE.  She has had RAI ablation of toxic thyroid  nodule.  Her TSH and free T4 have been WNL without needing thyroid  supplement.  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  She wakes up due to the movement of her parents walking in her home at nighttime.  She feels tired throughout the day.  She was given Elavil  for insomnia and chronic fatigue, but had drowsiness with it. She denies any recent change in her appetite or weight, fever, chills, chronic cough, hemoptysis, night sweats or LAD.    Past Medical History:  Diagnosis  Date   Arthritis    Calculus of gallbladder with chronic cholecystitis without obstruction    Chronic cholecystitis 06/11/2018   Hypertension    Seizure (HCC)    had seizurea as child; unknow etiology; was on phenobarbital for a few years and then was taken off meds. No seizure in 40 years.   Thyroid  disease    Trichimoniasis 02/11/2021   Treated with flagyl  02/11/21    Past Surgical History:  Procedure Laterality Date   CHOLECYSTECTOMY N/A 06/11/2018   Procedure: LAPAROSCOPIC CHOLECYSTECTOMY;  Surgeon: Kallie Manuelita BROCKS, MD;  Location: [facility];  Service: General;  Laterality: N/A;   GALLBLADDER SURGERY  06/11/2018   THYROID  SURGERY     removal of thyroid     Family History  Problem Relation Age of Onset   Aneurysm Maternal Grandmother    Hypertension Father    Hypertension Mother    Diabetes Mother    Heart attack Brother    Hypertension Sister    Kidney failure Sister    Hypertension Sister    Hypertension Sister    Hypertension Son     Social History   Socioeconomic History   Marital status: Single    Spouse name: Not on file   Number of children: Not on file   Years of education: Not on file   Highest education level: Not on file  Occupational History   Not on file  Tobacco Use   Smoking status: Former    Current packs/day: 0.00    Average packs/day: 0.3 packs/day for 35.0 years (8.8 ttl pk-yrs)    Types: Cigarettes    Quit date: 08/18/2023    Years since quitting: 0.5   Smokeless tobacco: Never   Tobacco comments:    states smokes 3 cig daily only  Substance and Sexual Activity   Alcohol use: No   Drug use: No   Sexual activity: Not Currently    Birth control/protection: Post-menopausal  Other Topics Concern   Not on file  Social History Narrative   Not on file   Social Drivers of Health   Financial Resource Strain: Low Risk  (02/08/2021)   Overall Financial Resource Strain (CARDIA)    Difficulty of Paying Living Expenses: Not hard at all  Food  Insecurity: No Food Insecurity (02/08/2021)   Hunger Vital Sign    Worried About Running Out of Food in the Last Year: Never true    Ran Out of Food in the Last Year: Never true  Transportation Needs: No Transportation Needs (02/08/2021)   PRAPARE - Administrator, Civil Service (Medical): No    Lack of Transportation (Non-Medical): No  Physical Activity: Insufficiently Active (02/08/2021)   Exercise Vital Sign    Days of Exercise per Week: 2 days    Minutes of Exercise per Session: 20 min  Stress: No Stress Concern Present (02/08/2021)   [employer] of Occupational Health - Occupational Stress Questionnaire    Feeling of Stress : Only a little  Social Connections: Moderately Integrated (02/08/2021)   Social Connection and Isolation Panel    Frequency of Communication with Friends and Family: More than three times a week    Frequency of Social Gatherings with Friends and Family: More than three times a week    Attends Religious Services: More than 4 times per year    Active Member of [employer] or Organizations: Yes    Attends Banker Meetings: More than 4 times per year    Marital Status: Never married  Intimate Partner Violence: Not At Risk (02/08/2021)   Humiliation, Afraid, Rape, and Kick questionnaire    Fear of Current or Ex-Partner: No    Emotionally Abused: No    Physically Abused: No    Sexually Abused: No    Outpatient Medications Prior to Visit  Medication Sig Dispense Refill   albuterol  (VENTOLIN  HFA) 108 (90 Base) MCG/ACT inhaler Inhale 1-2 puffs into the lungs every 6 (six) hours as needed for wheezing or shortness of breath. 18 g 1   baclofen  (LIORESAL ) 10 MG tablet Take 1 tablet (10 mg total) by mouth 2 (two) times daily as needed for muscle spasms. 30 each 2   diclofenac Sodium (VOLTAREN) 1 % GEL See admin instructions.     hydroxychloroquine (PLAQUENIL) 200 MG tablet Take 2 tablets by mouth daily.     meloxicam  (MOBIC ) 15 MG tablet Take 1  tablet by mouth once daily 60 tablet 0   mometasone  (NASONEX ) 50 MCG/ACT nasal spray Place 1 spray into the nose at bedtime. 1 each 0   ORENCIA  CLICKJECT 125 MG/ML SOAJ Inject 1 pen into the skin once a week. Takes on Tuesdays     rosuvastatin  (CRESTOR ) 5 MG tablet Take 1 tablet (5 mg total) by mouth daily. 90 tablet 1   Vitamin D , Ergocalciferol , (DRISDOL ) 1.25 MG (50000 UNIT) CAPS capsule Take 1 capsule (50,000 Units total) by mouth every 7 (seven)  days. 12 capsule 1   amLODipine  (NORVASC ) 10 MG tablet Take 1 tablet (10 mg total) by mouth daily. 90 tablet 1   hydrochlorothiazide  (MICROZIDE ) 12.5 MG capsule Take 1 capsule (12.5 mg total) by mouth daily. 90 capsule 1   HYDROcodone -acetaminophen  (NORCO/VICODIN) 5-325 MG tablet Take 1 tablet by mouth every 6 (six) hours as needed for severe pain (pain score 7-10). 10 tablet 0   labetalol  (NORMODYNE ) 200 MG tablet Take 1 tablet (200 mg total) by mouth 2 (two) times daily. 180 tablet 1   metoprolol  tartrate (LOPRESSOR ) 100 MG tablet Take 1 tablet by mouth two hours prior to CT scan. 1 tablet 0   No facility-administered medications prior to visit.    Allergies  Allergen Reactions   Lisinopril Swelling    ROS Review of Systems  Constitutional:  Positive for fatigue. Negative for chills and fever.  HENT:  Negative for congestion, postnasal drip, sinus pressure and sinus pain.   Eyes:  Negative for pain and discharge.  Respiratory:  Positive for shortness of breath (Exertional). Negative for cough.   Cardiovascular:  Negative for chest pain and palpitations.  Gastrointestinal:  Negative for abdominal pain, diarrhea, nausea and vomiting.  Endocrine: Negative for polydipsia and polyuria.  Genitourinary:  Negative for dysuria and hematuria.  Musculoskeletal:  Positive for arthralgias, back pain and neck pain. Negative for neck stiffness.  Skin:  Negative for rash.  Neurological:  Negative for dizziness and weakness.  Psychiatric/Behavioral:   Positive for sleep disturbance. Negative for agitation and behavioral problems.       Objective:    Physical Exam Vitals reviewed.  Constitutional:      General: She is not in acute distress.    Appearance: She is not diaphoretic.  HENT:     Head: Normocephalic and atraumatic.     Nose: No congestion.     Mouth/Throat:     Mouth: Mucous membranes are moist.     Pharynx: No posterior oropharyngeal erythema.  Eyes:     General: No scleral icterus.    Extraocular Movements: Extraocular movements intact.  Cardiovascular:     Rate and Rhythm: Normal rate and regular rhythm.     Heart sounds: Normal heart sounds. No murmur heard. Pulmonary:     Breath sounds: No wheezing or rales.  Abdominal:     Palpations: Abdomen is soft.     Tenderness: There is no abdominal tenderness.  Musculoskeletal:        General: Tenderness (Lumbar spine area and right paraspinal area) present.     Cervical back: Neck supple. Tenderness present. Pain with movement present.     Right knee: No swelling. Normal range of motion. No tenderness.     Left knee: No swelling. Normal range of motion. No tenderness.     Right lower leg: No edema.     Left lower leg: No edema.  Skin:    General: Skin is warm.     Findings: No rash.  Neurological:     General: No focal deficit present.     Mental Status: She is alert and oriented to person, place, and time.     Cranial Nerves: No cranial nerve deficit.     Sensory: No sensory deficit.     Motor: No weakness.  Psychiatric:        Mood and Affect: Mood normal.        Behavior: Behavior normal.     BP 135/81   Pulse 91   Ht 5' 5 (  1.651 m)   Wt 202 lb 3.2 oz (91.7 kg)   SpO2 98%   BMI 33.65 kg/m  Wt Readings from Last 3 Encounters:  03/13/24 202 lb 3.2 oz (91.7 kg)  01/25/24 203 lb (92.1 kg)  12/19/23 206 lb (93.4 kg)    Lab Results  Component Value Date   TSH 0.913 01/18/2024   Lab Results  Component Value Date   WBC 5.9 01/23/2023   HGB  13.5 01/23/2023   HCT 41.2 01/23/2023   MCV 81 01/23/2023   PLT 295 01/23/2023   Lab Results  Component Value Date   NA 140 05/30/2023   K 3.9 05/30/2023   CO2 23 05/30/2023   GLUCOSE 105 (H) 05/30/2023   BUN 9 05/30/2023   CREATININE 0.61 05/30/2023   BILITOT 0.2 05/30/2023   ALKPHOS 135 (H) 05/30/2023   AST 10 05/30/2023   ALT 9 05/30/2023   PROT 6.5 05/30/2023   ALBUMIN 4.0 05/30/2023   CALCIUM  9.1 05/30/2023   ANIONGAP 8 06/13/2022   EGFR 101 05/30/2023   Lab Results  Component Value Date   CHOL 162 05/30/2023   Lab Results  Component Value Date   HDL 30 (L) 05/30/2023   Lab Results  Component Value Date   LDLCALC 109 (H) 05/30/2023   Lab Results  Component Value Date   TRIG 128 05/30/2023   Lab Results  Component Value Date   CHOLHDL 5.4 (H) 05/30/2023   Lab Results  Component Value Date   HGBA1C 6.1 (H) 11/08/2023      Assessment & Plan:   Problem List Items Addressed This Visit       Cardiovascular and Mediastinum   Essential hypertension - Primary (Chronic)   BP Readings from Last 1 Encounters:  03/13/24 135/81   Usually well-controlled with Amlodipine , HCTZ and Labetalol  Counseled for compliance with the medications Advised DASH diet and moderate exercise/walking, at least 150 mins/week      Relevant Medications   amLODipine  (NORVASC ) 10 MG tablet   labetalol  (NORMODYNE ) 200 MG tablet   hydrochlorothiazide  (HYDRODIURIL ) 12.5 MG tablet     Musculoskeletal and Integument   Rheumatoid arthritis (HCC)   Followed by Rheumatology On Plaquenil and Orencia  Recent worsening of arthralgias and back pain, Sterapred taper prescribed Check CBC, CMP, CRP      Relevant Medications   predniSONE  (STERAPRED UNI-PAK 21 TAB) 10 MG (21) TBPK tablet   Other Relevant Orders   CBC with Differential/Platelet   CMP14+EGFR   C-reactive protein   Primary localized osteoarthrosis of multiple sites   B/l knee pain likely due to OA Sterapred taper  prescribed due to diffuse arthralgias Meloxicam  as needed for pain Followed by rheumatology        Other   Encounter for general adult medical examination with abnormal findings   Physical exam as documented. Immunization and cancer screening needs are specifically addressed at this visit.      Prediabetes   Lab Results  Component Value Date   HGBA1C 6.1 (H) 11/08/2023   Advised to follow low-carb diet      Relevant Orders   Hemoglobin A1c   Other Visit Diagnoses       Encounter for immunization       Relevant Orders   Flu vaccine trivalent PF, 6mos and older(Flulaval,Afluria,Fluarix,Fluzone) (Completed)         Meds ordered this encounter  Medications   predniSONE  (STERAPRED UNI-PAK 21 TAB) 10 MG (21) TBPK tablet    Sig: Take as  package instructions.    Dispense:  1 each    Refill:  0   amLODipine  (NORVASC ) 10 MG tablet    Sig: Take 1 tablet (10 mg total) by mouth daily.    Dispense:  90 tablet    Refill:  3   labetalol  (NORMODYNE ) 200 MG tablet    Sig: Take 1 tablet (200 mg total) by mouth 2 (two) times daily.    Dispense:  180 tablet    Refill:  3   hydrochlorothiazide  (HYDRODIURIL ) 12.5 MG tablet    Sig: Take 0.5 tablets (6.25 mg total) by mouth daily.    Dispense:  45 tablet    Refill:  3    Follow-up: Return in about 6 months (around 09/11/2024) for HTN.    Suzzane MARLA Blanch, MD

## 2024-03-13 NOTE — Patient Instructions (Addendum)
 Please start taking Prednisone  as prescribed.  Please take hydrochlorothiazide  half tablet once daily.  Please continue to take other medications as prescribed.  Please continue to follow low salt diet and perform moderate exercise/walking at least 150 mins/week.

## 2024-03-13 NOTE — Assessment & Plan Note (Addendum)
 BP Readings from Last 1 Encounters:  03/13/24 135/81   Usually well-controlled with Amlodipine  and Labetalol  Decrease dose of HCTZ to 6.25 mg QD due to nocturia Counseled for compliance with the medications Advised DASH diet and moderate exercise/walking, at least 150 mins/week

## 2024-03-13 NOTE — Assessment & Plan Note (Signed)
 Physical exam as documented. Immunization and cancer screening needs are specifically addressed at this visit.

## 2024-03-14 ENCOUNTER — Ambulatory Visit: Payer: Self-pay | Admitting: Internal Medicine

## 2024-03-15 LAB — CMP14+EGFR
ALT: 13 IU/L (ref 0–32)
AST: 17 IU/L (ref 0–40)
Albumin: 4.3 g/dL (ref 3.9–4.9)
Alkaline Phosphatase: 126 IU/L (ref 49–135)
BUN/Creatinine Ratio: 14 (ref 12–28)
BUN: 8 mg/dL (ref 8–27)
Bilirubin Total: 0.3 mg/dL (ref 0.0–1.2)
CO2: 22 mmol/L (ref 20–29)
Calcium: 9.4 mg/dL (ref 8.7–10.3)
Chloride: 100 mmol/L (ref 96–106)
Creatinine, Ser: 0.56 mg/dL — ABNORMAL LOW (ref 0.57–1.00)
Globulin, Total: 2.9 g/dL (ref 1.5–4.5)
Glucose: 90 mg/dL (ref 70–99)
Potassium: 4.1 mmol/L (ref 3.5–5.2)
Sodium: 139 mmol/L (ref 134–144)
Total Protein: 7.2 g/dL (ref 6.0–8.5)
eGFR: 102 mL/min/1.73 (ref >59)

## 2024-03-15 LAB — C-REACTIVE PROTEIN: CRP: 16 mg/L — ABNORMAL HIGH (ref 0–10)

## 2024-03-15 LAB — CBC WITH DIFFERENTIAL/PLATELET
Basophils Absolute: 0 x10E3/uL (ref 0.0–0.2)
Basos: 1 %
EOS (ABSOLUTE): 0.1 x10E3/uL (ref 0.0–0.4)
Eos: 2 %
Hematocrit: 41.3 % (ref 34.0–46.6)
Hemoglobin: 13.1 g/dL (ref 11.1–15.9)
Immature Grans (Abs): 0 x10E3/uL (ref 0.0–0.1)
Immature Granulocytes: 0 %
Lymphocytes Absolute: 1.4 x10E3/uL (ref 0.7–3.1)
Lymphs: 30 %
MCH: 26.2 pg — ABNORMAL LOW (ref 26.6–33.0)
MCHC: 31.7 g/dL (ref 31.5–35.7)
MCV: 83 fL (ref 79–97)
Monocytes Absolute: 0.3 x10E3/uL (ref 0.1–0.9)
Monocytes: 7 %
Neutrophils Absolute: 2.9 x10E3/uL (ref 1.4–7.0)
Neutrophils: 60 %
Platelets: 275 x10E3/uL (ref 150–450)
RBC: 5 x10E6/uL (ref 3.77–5.28)
RDW: 14.3 % (ref 11.7–15.4)
WBC: 4.9 x10E3/uL (ref 3.4–10.8)

## 2024-03-15 LAB — HEMOGLOBIN A1C
Est. average glucose Bld gHb Est-mCnc: 123 mg/dL
Hgb A1c MFr Bld: 5.9 % — ABNORMAL HIGH (ref 4.8–5.6)

## 2024-03-18 ENCOUNTER — Other Ambulatory Visit: Payer: Self-pay | Admitting: Internal Medicine

## 2024-03-18 DIAGNOSIS — M19011 Primary osteoarthritis, right shoulder: Secondary | ICD-10-CM

## 2024-03-19 ENCOUNTER — Other Ambulatory Visit: Payer: Self-pay | Admitting: Internal Medicine

## 2024-03-19 DIAGNOSIS — M069 Rheumatoid arthritis, unspecified: Secondary | ICD-10-CM

## 2024-03-20 ENCOUNTER — Encounter: Payer: Self-pay | Admitting: Orthopedic Surgery

## 2024-03-20 ENCOUNTER — Ambulatory Visit: Admitting: Orthopedic Surgery

## 2024-03-20 DIAGNOSIS — M25511 Pain in right shoulder: Secondary | ICD-10-CM

## 2024-03-20 DIAGNOSIS — G8929 Other chronic pain: Secondary | ICD-10-CM | POA: Diagnosis not present

## 2024-03-20 DIAGNOSIS — M25512 Pain in left shoulder: Secondary | ICD-10-CM | POA: Diagnosis not present

## 2024-03-20 NOTE — Patient Instructions (Signed)

## 2024-03-20 NOTE — Progress Notes (Signed)
 Return Patient Visit  Assessment: Grisela Mesch is a 63 y.o. female with the following: Bilateral shoulder pain.  Plan: Raevin Wierenga has pain in bilateral shoulders.  She has previously good results following injections.  I last saw her in clinic greater than 3 months ago.  She is interested in repeat injections.  These were completed in clinic today.  She will follow-up as needed.  Procedure note injection - Right shoulder    Verbal consent was obtained to inject the right shoulder, subacromial space Timeout was completed to confirm the site of injection.   The skin was prepped with alcohol and ethyl chloride was sprayed at the injection site.  A 21-gauge needle was used to inject 40 mg of Depo-Medrol and 1% lidocaine  (4 cc) into the subacromial space of the right shoulder using a posterolateral approach.  There were no complications.  A sterile bandage was applied.     Procedure note injection Left shoulder    Verbal consent was obtained to inject the left shoulder, subacromial space Timeout was completed to confirm the site of injection.  The skin was prepped with alcohol and ethyl chloride was sprayed at the injection site.  A 21-gauge needle was used to inject 40 mg of Depo-Medrol and 1% lidocaine  (4 cc) into the subacromial space of the left shoulder using a posterolateral approach.  There were no complications. A sterile bandage was applied.   Follow-up: Return if symptoms worsen or fail to improve.  Subjective:  Chief Complaint  Patient presents with   Injections    Bilat shoulders     History of Present Illness: Cristi Gwynn is a 63 y.o. female who returns to clinic for repeat evaluation of bilateral shoulder pain.  I saw her in clinic 3 months ago.  Both shoulders were injected.  These provided improvement in his symptoms until recently.  The pain has returned.  She is interested in repeat injections.    Review of Systems: No fevers or chills No numbness or  tingling No chest pain No shortness of breath No bowel or bladder dysfunction No GI distress No headaches      Objective: There were no vitals taken for this visit.  Physical Exam:  General: Alert and oriented. and No acute distress. Gait: Normal gait.  Bilateral shoulders without deformity.  No redness.  No swelling.  No point tenderness.  Limited range of motion beyond 90 degrees of forward flexion.  She demonstrates discomfort.  Low concern for frozen shoulder based on my limited exam.  Sensation is intact throughout bilateral hands.  Fingers are warm and well-perfused.  IMAGING: No new imaging obtained today    New Medications:  No orders of the defined types were placed in this encounter.     Oneil DELENA Horde, MD  03/20/2024 10:21 AM

## 2024-05-01 ENCOUNTER — Other Ambulatory Visit: Payer: Self-pay

## 2024-05-01 ENCOUNTER — Emergency Department (HOSPITAL_COMMUNITY)
Admission: EM | Admit: 2024-05-01 | Discharge: 2024-05-01 | Disposition: A | Discharge status: Home / Self Care | Attending: Emergency Medicine | Admitting: Emergency Medicine

## 2024-05-01 ENCOUNTER — Encounter (HOSPITAL_COMMUNITY): Payer: Self-pay | Admitting: *Deleted

## 2024-05-01 DIAGNOSIS — M25511 Pain in right shoulder: Secondary | ICD-10-CM | POA: Insufficient documentation

## 2024-05-01 DIAGNOSIS — G8929 Other chronic pain: Secondary | ICD-10-CM | POA: Diagnosis not present

## 2024-05-01 DIAGNOSIS — M25512 Pain in left shoulder: Secondary | ICD-10-CM | POA: Insufficient documentation

## 2024-05-01 MED ORDER — OXYCODONE HCL 5 MG PO TABS
5.0000 mg | ORAL_TABLET | Freq: Once | ORAL | Status: AC
  Administered 2024-05-01: 5 mg via ORAL
  Filled 2024-05-01: qty 1

## 2024-05-01 MED ORDER — OXYCODONE-ACETAMINOPHEN 5-325 MG PO TABS: 1.0000 | ORAL_TABLET | Freq: Three times a day (TID) | ORAL | 0 refills | Status: AC | PRN

## 2024-05-01 MED ORDER — LIDOCAINE 5 % EX PTCH
1.0000 | MEDICATED_PATCH | CUTANEOUS | Status: DC
  Administered 2024-05-01: 1 via TRANSDERMAL
  Filled 2024-05-01: qty 1

## 2024-05-01 MED ORDER — DEXAMETHASONE SOD PHOSPHATE PF 10 MG/ML IJ SOLN
10.0000 mg | Freq: Once | INTRAMUSCULAR | Status: AC
  Administered 2024-05-01: 10 mg via INTRAMUSCULAR
  Filled 2024-05-01: qty 1

## 2024-05-01 MED ORDER — LIDOCAINE 4 % EX PTCH: 1.0000 | MEDICATED_PATCH | Freq: Two times a day (BID) | CUTANEOUS | 0 refills | Status: AC

## 2024-05-01 MED ORDER — PREDNISONE 10 MG (21) PO TBPK: ORAL_TABLET | Freq: Every day | ORAL | 0 refills | Status: AC

## 2024-05-01 NOTE — ED Provider Notes (Signed)
 " [place] EMERGENCY DEPARTMENT AT [facility] Provider Note   CSN: [id number] Arrival date & time: 05/01/24  9051     Patient presents with: Arm Pain   Danessa Mensch is a 64 y.o. female.   HPI    64 year old female comes in with chief complaint of bilateral shoulder pain.  Patient states that she has history of rheumatoid arthritis and also arthritis of both of her shoulders.  She has previously seen orthopedist, who is giving her injections over the shoulder.  Last injection was about a month ago.  She states that normally she gets relief for about a month, and then the pain gets worse.  This episode of pain has been going on for 2 days, and it prevented her from sleeping last night.  Pain is sharp.  There is no associated numbness or tingling.  Pain is worse with any activity.   Prior to Admission medications  Medication Sig Start Date End Date Taking? Authorizing Provider  lidocaine  4 % Place 1 patch onto the skin 2 (two) times daily. 05/01/24  Yes Charlyn Sora, MD  oxyCODONE -acetaminophen  (PERCOCET/ROXICET) 5-325 MG tablet Take 1 tablet by mouth every 8 (eight) hours as needed for severe pain (pain score 7-10). 05/01/24  Yes Jeylin Woodmansee, MD  predniSONE  (STERAPRED UNI-PAK 21 TAB) 10 MG (21) TBPK tablet Take by mouth daily. Take 6 tabs by mouth daily  for 2 days, then 5 tabs for 2 days, then 4 tabs for 2 days, then 3 tabs for 2 days, 2 tabs for 2 days, then 1 tab by mouth daily for 2 days 05/01/24  Yes Charlyn Sora, MD  albuterol  (VENTOLIN  HFA) 108 (90 Base) MCG/ACT inhaler Inhale 1-2 puffs into the lungs every 6 (six) hours as needed for wheezing or shortness of breath. 05/30/23   Tobie Suzzane POUR, MD  amLODipine  (NORVASC ) 10 MG tablet Take 1 tablet (10 mg total) by mouth daily. 03/13/24   Tobie Suzzane POUR, MD  baclofen  (LIORESAL ) 10 MG tablet Take 1 tablet (10 mg total) by mouth 2 (two) times daily as needed for muscle spasms. 11/08/23   Tobie Suzzane POUR, MD  diclofenac  Sodium (VOLTAREN) 1 % GEL See admin instructions. 10/17/16   [provider]  hydrochlorothiazide  (HYDRODIURIL ) 12.5 MG tablet Take 0.5 tablets (6.25 mg total) by mouth daily. 03/13/24   Patel, Rutwik K, MD  hydroxychloroquine (PLAQUENIL) 200 MG tablet Take 2 tablets by mouth daily. 04/17/18   [provider]  labetalol  (NORMODYNE ) 200 MG tablet Take 1 tablet (200 mg total) by mouth 2 (two) times daily. 03/13/24   Tobie Suzzane POUR, MD  meloxicam  (MOBIC ) 15 MG tablet Take 1 tablet by mouth once daily 03/18/24   Patel, Rutwik K, MD  mometasone  (NASONEX ) 50 MCG/ACT nasal spray Place 1 spray into the nose at bedtime. 07/07/22   Jude Harden GAILS, MD  ORENCIA  CLICKJECT 125 MG/ML SOAJ Inject 1 pen into the skin once a week. Takes on Tuesdays 06/26/18   Kallie Manuelita BROCKS, MD  rosuvastatin  (CRESTOR ) 5 MG tablet Take 1 tablet (5 mg total) by mouth daily. 01/30/23   Tobie Suzzane POUR, MD  Vitamin D , Ergocalciferol , (DRISDOL ) 1.25 MG (50000 UNIT) CAPS capsule Take 1 capsule (50,000 Units total) by mouth every 7 (seven) days. 01/30/23   Tobie Suzzane POUR, MD    Allergies: Lisinopril    Review of Systems  All other systems reviewed and are negative.   Updated Vital Signs BP (!) 154/97 (BP Location: Left Arm)  Pulse 84   Temp 98.3 F (36.8 C) (Oral)   Resp 16   Ht 5' 5 (1.651 m)   Wt 93 kg   SpO2 99%   BMI 34.11 kg/m   Physical Exam Vitals and nursing note reviewed.  Constitutional:      Appearance: She is well-developed.  HENT:     Head: Atraumatic.  Cardiovascular:     Rate and Rhythm: Normal rate.  Pulmonary:     Effort: Pulmonary effort is normal.  Musculoskeletal:     Cervical back: Normal range of motion and neck supple.     Comments: Patient able to tolerate passive range of motion of the right shoulder, she has tenderness with abduction over 90 degrees and has tenderness with abduction right away.  She is able to tolerate minimal internal and external rotation of the right  shoulder  Skin:    General: Skin is warm and dry.  Neurological:     Mental Status: She is alert and oriented to person, place, and time.     (all labs ordered are listed, but only abnormal results are displayed) Labs Reviewed - No data to display  EKG: None  Radiology: No results found.   Procedures   Medications Ordered in the ED  lidocaine  (LIDODERM ) 5 % 1 patch (1 patch Transdermal Patch Applied 05/01/24 1216)  oxyCODONE  (Oxy IR/ROXICODONE ) immediate release tablet 5 mg (5 mg Oral Given 05/01/24 1217)  dexamethasone  (DECADRON ) injection 10 mg (10 mg Intramuscular Given 05/01/24 1216)                                    Medical Decision Making Risk OTC drugs. Prescription drug management.   64 year old female with history of rheumatoid arthritis and osteoarthritis comes in with chief complaint of bilateral shoulder pain, right worse than left.  She has previous history of pain on both sides, and has been treated by orthopedist with injections.  She states that the injections gives her about a month of relief.  Next appointment is not until March.  I have reviewed patient's records including previous CT scans.  She had a CT cervical spine that did not show any severe arthritic changes then.  Patient does not have any neuropathic symptoms right now.  I have also reviewed the orthopedic visit, and they thought that patient did have osteoarthritis that could explain the symptoms.  Differential diagnoses effectively includes worsening of rheumatoid arthritis and osteoarthritis.  Other possibilities include rotator cuff and tendinitis.  Medically patient does not have septic arthritis.  Patient states that previously when she was given systemic steroids, her symptoms did respond for longer duration.  She is already taking oral pain medications.   Final diagnoses:  Chronic pain of both shoulders    ED Discharge Orders          Ordered    lidocaine  4 %  2 times daily         05/01/24 1359    predniSONE  (STERAPRED UNI-PAK 21 TAB) 10 MG (21) TBPK tablet  Daily        05/01/24 1359    oxyCODONE -acetaminophen  (PERCOCET/ROXICET) 5-325 MG tablet  Every 8 hours PRN        05/01/24 1405               Charlyn Sora, MD 05/01/24 1405  "

## 2024-05-01 NOTE — Discharge Instructions (Signed)
 You were seen in the emergency room for your shoulder pain.  As discussed, it is prudent that you continue to follow-up with both the rheumatologist and orthopedist.  We would recommend that you call the rheumatology team to see if they can see you sooner.  In the interim, we have prescribed you steroid taper that we hope should help.  Also apply lidocaine  patch for further relief.  Please see your primary care doctor to see if they can help you with pain management.

## 2024-05-01 NOTE — ED Triage Notes (Signed)
 Pt c/o bilateral arm pain x 3 weeks; pt states she is unable to put her arms above her head and is having neck pain

## 2024-05-01 NOTE — ED Notes (Signed)
 Spoke with pt who had steroid shot in December and this controlled pain (arthritis) for about a month, but pain has came back and is getting worse.

## 2024-05-09 ENCOUNTER — Encounter (INDEPENDENT_AMBULATORY_CARE_PROVIDER_SITE_OTHER): Payer: Self-pay | Admitting: *Deleted

## 2024-07-09 ENCOUNTER — Ambulatory Visit

## 2024-09-11 ENCOUNTER — Ambulatory Visit: Admitting: Internal Medicine

## 2025-01-24 ENCOUNTER — Ambulatory Visit: Admitting: Nurse Practitioner
